# Patient Record
Sex: Male | Born: 1945 | Marital: Single | Smoking: Never smoker
Health system: Southern US, Community
[De-identification: ages and names within clinical notes are randomized; demographics above are authoritative.]

## PROBLEM LIST (undated history)

## (undated) DIAGNOSIS — N189 Chronic kidney disease, unspecified: Secondary | ICD-10-CM

## (undated) DIAGNOSIS — I10 Essential (primary) hypertension: Secondary | ICD-10-CM

## (undated) DIAGNOSIS — E119 Type 2 diabetes mellitus without complications: Secondary | ICD-10-CM

## (undated) DIAGNOSIS — R5383 Other fatigue: Secondary | ICD-10-CM

## (undated) DIAGNOSIS — R5381 Other malaise: Secondary | ICD-10-CM

## (undated) HISTORY — PX: OTHER SURGICAL HISTORY: SHX169

## (undated) HISTORY — DX: Other malaise: R53.81

## (undated) HISTORY — DX: Type 2 diabetes mellitus without complications: E11.9

## (undated) HISTORY — DX: Essential (primary) hypertension: I10

## (undated) HISTORY — DX: Chronic kidney disease, unspecified: N18.9

## (undated) HISTORY — DX: Other fatigue: R53.83

---

## 2013-07-19 DIAGNOSIS — E669 Obesity, unspecified: Secondary | ICD-10-CM | POA: Diagnosis not present

## 2013-07-19 DIAGNOSIS — I10 Essential (primary) hypertension: Secondary | ICD-10-CM | POA: Diagnosis not present

## 2013-07-19 DIAGNOSIS — R635 Abnormal weight gain: Secondary | ICD-10-CM | POA: Diagnosis not present

## 2013-07-19 DIAGNOSIS — R5381 Other malaise: Secondary | ICD-10-CM | POA: Diagnosis not present

## 2013-07-19 DIAGNOSIS — N529 Male erectile dysfunction, unspecified: Secondary | ICD-10-CM | POA: Diagnosis not present

## 2013-07-19 DIAGNOSIS — H6120 Impacted cerumen, unspecified ear: Secondary | ICD-10-CM | POA: Diagnosis not present

## 2013-07-19 DIAGNOSIS — Z0000 Encounter for general adult medical examination without abnormal findings: Secondary | ICD-10-CM | POA: Diagnosis not present

## 2013-07-19 DIAGNOSIS — E559 Vitamin D deficiency, unspecified: Secondary | ICD-10-CM | POA: Diagnosis not present

## 2013-07-19 DIAGNOSIS — R351 Nocturia: Secondary | ICD-10-CM | POA: Diagnosis not present

## 2013-07-19 DIAGNOSIS — R7309 Other abnormal glucose: Secondary | ICD-10-CM | POA: Diagnosis not present

## 2013-07-21 DIAGNOSIS — N529 Male erectile dysfunction, unspecified: Secondary | ICD-10-CM | POA: Diagnosis not present

## 2013-07-21 DIAGNOSIS — I10 Essential (primary) hypertension: Secondary | ICD-10-CM | POA: Diagnosis not present

## 2013-07-21 DIAGNOSIS — E669 Obesity, unspecified: Secondary | ICD-10-CM | POA: Diagnosis not present

## 2013-08-09 DIAGNOSIS — E1165 Type 2 diabetes mellitus with hyperglycemia: Secondary | ICD-10-CM | POA: Diagnosis not present

## 2013-08-09 DIAGNOSIS — Z713 Dietary counseling and surveillance: Secondary | ICD-10-CM | POA: Diagnosis not present

## 2013-08-09 DIAGNOSIS — N529 Male erectile dysfunction, unspecified: Secondary | ICD-10-CM | POA: Diagnosis not present

## 2013-08-09 DIAGNOSIS — I10 Essential (primary) hypertension: Secondary | ICD-10-CM | POA: Diagnosis not present

## 2013-08-09 DIAGNOSIS — IMO0002 Reserved for concepts with insufficient information to code with codable children: Secondary | ICD-10-CM | POA: Diagnosis not present

## 2013-09-19 DIAGNOSIS — E782 Mixed hyperlipidemia: Secondary | ICD-10-CM | POA: Diagnosis not present

## 2013-09-19 DIAGNOSIS — N529 Male erectile dysfunction, unspecified: Secondary | ICD-10-CM | POA: Diagnosis not present

## 2013-09-19 DIAGNOSIS — I10 Essential (primary) hypertension: Secondary | ICD-10-CM | POA: Diagnosis not present

## 2013-09-19 DIAGNOSIS — IMO0001 Reserved for inherently not codable concepts without codable children: Secondary | ICD-10-CM | POA: Diagnosis not present

## 2013-10-03 DIAGNOSIS — E119 Type 2 diabetes mellitus without complications: Secondary | ICD-10-CM | POA: Diagnosis not present

## 2013-10-03 DIAGNOSIS — I10 Essential (primary) hypertension: Secondary | ICD-10-CM | POA: Diagnosis not present

## 2013-10-03 DIAGNOSIS — Z1211 Encounter for screening for malignant neoplasm of colon: Secondary | ICD-10-CM | POA: Diagnosis not present

## 2013-10-10 DIAGNOSIS — IMO0001 Reserved for inherently not codable concepts without codable children: Secondary | ICD-10-CM | POA: Diagnosis not present

## 2013-10-10 DIAGNOSIS — I10 Essential (primary) hypertension: Secondary | ICD-10-CM | POA: Diagnosis not present

## 2013-10-19 DIAGNOSIS — IMO0002 Reserved for concepts with insufficient information to code with codable children: Secondary | ICD-10-CM | POA: Diagnosis not present

## 2013-10-19 DIAGNOSIS — Z79899 Other long term (current) drug therapy: Secondary | ICD-10-CM | POA: Diagnosis not present

## 2013-10-19 DIAGNOSIS — E1165 Type 2 diabetes mellitus with hyperglycemia: Secondary | ICD-10-CM | POA: Diagnosis not present

## 2013-10-19 DIAGNOSIS — E782 Mixed hyperlipidemia: Secondary | ICD-10-CM | POA: Diagnosis not present

## 2013-10-19 DIAGNOSIS — I10 Essential (primary) hypertension: Secondary | ICD-10-CM | POA: Diagnosis not present

## 2013-10-19 DIAGNOSIS — E669 Obesity, unspecified: Secondary | ICD-10-CM | POA: Diagnosis not present

## 2013-10-31 DIAGNOSIS — N529 Male erectile dysfunction, unspecified: Secondary | ICD-10-CM | POA: Diagnosis not present

## 2013-10-31 DIAGNOSIS — IMO0001 Reserved for inherently not codable concepts without codable children: Secondary | ICD-10-CM | POA: Diagnosis not present

## 2013-10-31 DIAGNOSIS — I10 Essential (primary) hypertension: Secondary | ICD-10-CM | POA: Diagnosis not present

## 2013-11-10 DIAGNOSIS — N529 Male erectile dysfunction, unspecified: Secondary | ICD-10-CM | POA: Diagnosis not present

## 2013-11-10 DIAGNOSIS — I10 Essential (primary) hypertension: Secondary | ICD-10-CM | POA: Diagnosis not present

## 2013-12-01 DIAGNOSIS — R5381 Other malaise: Secondary | ICD-10-CM | POA: Diagnosis not present

## 2013-12-01 DIAGNOSIS — N529 Male erectile dysfunction, unspecified: Secondary | ICD-10-CM | POA: Diagnosis not present

## 2013-12-01 DIAGNOSIS — E291 Testicular hypofunction: Secondary | ICD-10-CM | POA: Diagnosis not present

## 2013-12-01 DIAGNOSIS — IMO0001 Reserved for inherently not codable concepts without codable children: Secondary | ICD-10-CM | POA: Diagnosis not present

## 2013-12-01 DIAGNOSIS — R5383 Other fatigue: Secondary | ICD-10-CM | POA: Diagnosis not present

## 2014-01-25 DIAGNOSIS — N058 Unspecified nephritic syndrome with other morphologic changes: Secondary | ICD-10-CM | POA: Diagnosis not present

## 2014-01-25 DIAGNOSIS — N182 Chronic kidney disease, stage 2 (mild): Secondary | ICD-10-CM | POA: Diagnosis not present

## 2014-01-25 DIAGNOSIS — E1129 Type 2 diabetes mellitus with other diabetic kidney complication: Secondary | ICD-10-CM | POA: Diagnosis not present

## 2014-01-25 DIAGNOSIS — E1165 Type 2 diabetes mellitus with hyperglycemia: Secondary | ICD-10-CM | POA: Diagnosis not present

## 2014-01-25 DIAGNOSIS — I129 Hypertensive chronic kidney disease with stage 1 through stage 4 chronic kidney disease, or unspecified chronic kidney disease: Secondary | ICD-10-CM | POA: Diagnosis not present

## 2014-02-06 DIAGNOSIS — I10 Essential (primary) hypertension: Secondary | ICD-10-CM | POA: Diagnosis not present

## 2014-02-06 DIAGNOSIS — E291 Testicular hypofunction: Secondary | ICD-10-CM | POA: Diagnosis not present

## 2014-02-06 DIAGNOSIS — IMO0001 Reserved for inherently not codable concepts without codable children: Secondary | ICD-10-CM | POA: Diagnosis not present

## 2014-02-06 DIAGNOSIS — R5381 Other malaise: Secondary | ICD-10-CM | POA: Diagnosis not present

## 2014-02-06 DIAGNOSIS — R5383 Other fatigue: Secondary | ICD-10-CM | POA: Diagnosis not present

## 2014-03-09 DIAGNOSIS — E1165 Type 2 diabetes mellitus with hyperglycemia: Secondary | ICD-10-CM | POA: Diagnosis not present

## 2014-03-09 DIAGNOSIS — N058 Unspecified nephritic syndrome with other morphologic changes: Secondary | ICD-10-CM | POA: Diagnosis not present

## 2014-03-09 DIAGNOSIS — Z7982 Long term (current) use of aspirin: Secondary | ICD-10-CM | POA: Diagnosis not present

## 2014-03-09 DIAGNOSIS — E1129 Type 2 diabetes mellitus with other diabetic kidney complication: Secondary | ICD-10-CM | POA: Diagnosis not present

## 2014-03-09 DIAGNOSIS — N182 Chronic kidney disease, stage 2 (mild): Secondary | ICD-10-CM | POA: Diagnosis not present

## 2014-04-07 DIAGNOSIS — N182 Chronic kidney disease, stage 2 (mild): Secondary | ICD-10-CM | POA: Diagnosis not present

## 2014-04-07 DIAGNOSIS — E1129 Type 2 diabetes mellitus with other diabetic kidney complication: Secondary | ICD-10-CM | POA: Diagnosis not present

## 2014-04-07 DIAGNOSIS — N058 Unspecified nephritic syndrome with other morphologic changes: Secondary | ICD-10-CM | POA: Diagnosis not present

## 2014-04-07 DIAGNOSIS — I951 Orthostatic hypotension: Secondary | ICD-10-CM | POA: Diagnosis not present

## 2014-04-07 DIAGNOSIS — E1165 Type 2 diabetes mellitus with hyperglycemia: Secondary | ICD-10-CM | POA: Diagnosis not present

## 2014-05-04 DIAGNOSIS — I129 Hypertensive chronic kidney disease with stage 1 through stage 4 chronic kidney disease, or unspecified chronic kidney disease: Secondary | ICD-10-CM | POA: Diagnosis not present

## 2014-05-04 DIAGNOSIS — E1129 Type 2 diabetes mellitus with other diabetic kidney complication: Secondary | ICD-10-CM | POA: Diagnosis not present

## 2014-05-04 DIAGNOSIS — N182 Chronic kidney disease, stage 2 (mild): Secondary | ICD-10-CM | POA: Diagnosis not present

## 2014-05-04 DIAGNOSIS — N058 Unspecified nephritic syndrome with other morphologic changes: Secondary | ICD-10-CM | POA: Diagnosis not present

## 2014-05-04 DIAGNOSIS — E1165 Type 2 diabetes mellitus with hyperglycemia: Secondary | ICD-10-CM | POA: Diagnosis not present

## 2014-11-17 DIAGNOSIS — N182 Chronic kidney disease, stage 2 (mild): Secondary | ICD-10-CM | POA: Diagnosis not present

## 2014-11-17 DIAGNOSIS — E1122 Type 2 diabetes mellitus with diabetic chronic kidney disease: Secondary | ICD-10-CM | POA: Diagnosis not present

## 2014-11-17 DIAGNOSIS — N08 Glomerular disorders in diseases classified elsewhere: Secondary | ICD-10-CM | POA: Diagnosis not present

## 2014-11-17 DIAGNOSIS — E1165 Type 2 diabetes mellitus with hyperglycemia: Secondary | ICD-10-CM | POA: Diagnosis not present

## 2015-03-13 DIAGNOSIS — E559 Vitamin D deficiency, unspecified: Secondary | ICD-10-CM | POA: Diagnosis not present

## 2015-03-13 DIAGNOSIS — I129 Hypertensive chronic kidney disease with stage 1 through stage 4 chronic kidney disease, or unspecified chronic kidney disease: Secondary | ICD-10-CM | POA: Diagnosis not present

## 2015-03-13 DIAGNOSIS — R351 Nocturia: Secondary | ICD-10-CM | POA: Diagnosis not present

## 2015-03-13 DIAGNOSIS — Z0000 Encounter for general adult medical examination without abnormal findings: Secondary | ICD-10-CM | POA: Diagnosis not present

## 2015-03-13 DIAGNOSIS — Z1212 Encounter for screening for malignant neoplasm of rectum: Secondary | ICD-10-CM | POA: Diagnosis not present

## 2015-03-13 DIAGNOSIS — N08 Glomerular disorders in diseases classified elsewhere: Secondary | ICD-10-CM | POA: Diagnosis not present

## 2015-03-13 DIAGNOSIS — N182 Chronic kidney disease, stage 2 (mild): Secondary | ICD-10-CM | POA: Diagnosis not present

## 2015-03-13 DIAGNOSIS — Z125 Encounter for screening for malignant neoplasm of prostate: Secondary | ICD-10-CM | POA: Diagnosis not present

## 2015-03-13 DIAGNOSIS — E1122 Type 2 diabetes mellitus with diabetic chronic kidney disease: Secondary | ICD-10-CM | POA: Diagnosis not present

## 2015-03-13 DIAGNOSIS — Z1389 Encounter for screening for other disorder: Secondary | ICD-10-CM | POA: Diagnosis not present

## 2015-04-23 DIAGNOSIS — Z79899 Other long term (current) drug therapy: Secondary | ICD-10-CM | POA: Diagnosis not present

## 2015-06-21 DIAGNOSIS — I129 Hypertensive chronic kidney disease with stage 1 through stage 4 chronic kidney disease, or unspecified chronic kidney disease: Secondary | ICD-10-CM | POA: Diagnosis not present

## 2015-06-21 DIAGNOSIS — E1122 Type 2 diabetes mellitus with diabetic chronic kidney disease: Secondary | ICD-10-CM | POA: Diagnosis not present

## 2015-06-21 DIAGNOSIS — N08 Glomerular disorders in diseases classified elsewhere: Secondary | ICD-10-CM | POA: Diagnosis not present

## 2015-06-21 DIAGNOSIS — N182 Chronic kidney disease, stage 2 (mild): Secondary | ICD-10-CM | POA: Diagnosis not present

## 2015-10-01 DIAGNOSIS — N08 Glomerular disorders in diseases classified elsewhere: Secondary | ICD-10-CM | POA: Diagnosis not present

## 2015-10-01 DIAGNOSIS — E1122 Type 2 diabetes mellitus with diabetic chronic kidney disease: Secondary | ICD-10-CM | POA: Diagnosis not present

## 2015-10-01 DIAGNOSIS — N182 Chronic kidney disease, stage 2 (mild): Secondary | ICD-10-CM | POA: Diagnosis not present

## 2015-10-01 DIAGNOSIS — I129 Hypertensive chronic kidney disease with stage 1 through stage 4 chronic kidney disease, or unspecified chronic kidney disease: Secondary | ICD-10-CM | POA: Diagnosis not present

## 2016-01-03 DIAGNOSIS — N182 Chronic kidney disease, stage 2 (mild): Secondary | ICD-10-CM | POA: Diagnosis not present

## 2016-01-03 DIAGNOSIS — I129 Hypertensive chronic kidney disease with stage 1 through stage 4 chronic kidney disease, or unspecified chronic kidney disease: Secondary | ICD-10-CM | POA: Diagnosis not present

## 2016-01-03 DIAGNOSIS — N08 Glomerular disorders in diseases classified elsewhere: Secondary | ICD-10-CM | POA: Diagnosis not present

## 2016-01-03 DIAGNOSIS — E1122 Type 2 diabetes mellitus with diabetic chronic kidney disease: Secondary | ICD-10-CM | POA: Diagnosis not present

## 2016-04-28 DIAGNOSIS — E559 Vitamin D deficiency, unspecified: Secondary | ICD-10-CM | POA: Diagnosis not present

## 2016-04-28 DIAGNOSIS — E1122 Type 2 diabetes mellitus with diabetic chronic kidney disease: Secondary | ICD-10-CM | POA: Diagnosis not present

## 2016-04-28 DIAGNOSIS — I129 Hypertensive chronic kidney disease with stage 1 through stage 4 chronic kidney disease, or unspecified chronic kidney disease: Secondary | ICD-10-CM | POA: Diagnosis not present

## 2016-04-28 DIAGNOSIS — N08 Glomerular disorders in diseases classified elsewhere: Secondary | ICD-10-CM | POA: Diagnosis not present

## 2016-04-28 DIAGNOSIS — N182 Chronic kidney disease, stage 2 (mild): Secondary | ICD-10-CM | POA: Diagnosis not present

## 2016-06-05 DIAGNOSIS — H6122 Impacted cerumen, left ear: Secondary | ICD-10-CM | POA: Diagnosis not present

## 2016-07-24 DIAGNOSIS — F515 Nightmare disorder: Secondary | ICD-10-CM | POA: Diagnosis not present

## 2016-07-24 DIAGNOSIS — N08 Glomerular disorders in diseases classified elsewhere: Secondary | ICD-10-CM | POA: Diagnosis not present

## 2016-07-24 DIAGNOSIS — N182 Chronic kidney disease, stage 2 (mild): Secondary | ICD-10-CM | POA: Diagnosis not present

## 2016-07-24 DIAGNOSIS — E1122 Type 2 diabetes mellitus with diabetic chronic kidney disease: Secondary | ICD-10-CM | POA: Diagnosis not present

## 2016-11-19 DIAGNOSIS — E1122 Type 2 diabetes mellitus with diabetic chronic kidney disease: Secondary | ICD-10-CM | POA: Diagnosis not present

## 2016-11-19 DIAGNOSIS — N08 Glomerular disorders in diseases classified elsewhere: Secondary | ICD-10-CM | POA: Diagnosis not present

## 2016-11-19 DIAGNOSIS — F515 Nightmare disorder: Secondary | ICD-10-CM | POA: Diagnosis not present

## 2016-11-19 DIAGNOSIS — N182 Chronic kidney disease, stage 2 (mild): Secondary | ICD-10-CM | POA: Diagnosis not present

## 2017-03-09 DIAGNOSIS — N182 Chronic kidney disease, stage 2 (mild): Secondary | ICD-10-CM | POA: Diagnosis not present

## 2017-03-09 DIAGNOSIS — I129 Hypertensive chronic kidney disease with stage 1 through stage 4 chronic kidney disease, or unspecified chronic kidney disease: Secondary | ICD-10-CM | POA: Diagnosis not present

## 2017-03-09 DIAGNOSIS — N08 Glomerular disorders in diseases classified elsewhere: Secondary | ICD-10-CM | POA: Diagnosis not present

## 2017-03-09 DIAGNOSIS — E1122 Type 2 diabetes mellitus with diabetic chronic kidney disease: Secondary | ICD-10-CM | POA: Diagnosis not present

## 2017-05-25 DIAGNOSIS — N08 Glomerular disorders in diseases classified elsewhere: Secondary | ICD-10-CM | POA: Diagnosis not present

## 2017-05-25 DIAGNOSIS — E1122 Type 2 diabetes mellitus with diabetic chronic kidney disease: Secondary | ICD-10-CM | POA: Diagnosis not present

## 2017-05-25 DIAGNOSIS — R351 Nocturia: Secondary | ICD-10-CM | POA: Diagnosis not present

## 2017-05-25 DIAGNOSIS — Z0000 Encounter for general adult medical examination without abnormal findings: Secondary | ICD-10-CM | POA: Diagnosis not present

## 2017-05-25 DIAGNOSIS — Z1212 Encounter for screening for malignant neoplasm of rectum: Secondary | ICD-10-CM | POA: Diagnosis not present

## 2017-05-25 DIAGNOSIS — R195 Other fecal abnormalities: Secondary | ICD-10-CM | POA: Diagnosis not present

## 2017-05-25 DIAGNOSIS — I129 Hypertensive chronic kidney disease with stage 1 through stage 4 chronic kidney disease, or unspecified chronic kidney disease: Secondary | ICD-10-CM | POA: Diagnosis not present

## 2017-05-25 DIAGNOSIS — N182 Chronic kidney disease, stage 2 (mild): Secondary | ICD-10-CM | POA: Diagnosis not present

## 2017-05-25 DIAGNOSIS — E559 Vitamin D deficiency, unspecified: Secondary | ICD-10-CM | POA: Diagnosis not present

## 2017-05-26 DIAGNOSIS — E1122 Type 2 diabetes mellitus with diabetic chronic kidney disease: Secondary | ICD-10-CM | POA: Diagnosis not present

## 2017-05-26 DIAGNOSIS — N182 Chronic kidney disease, stage 2 (mild): Secondary | ICD-10-CM | POA: Diagnosis not present

## 2017-05-26 DIAGNOSIS — N08 Glomerular disorders in diseases classified elsewhere: Secondary | ICD-10-CM | POA: Diagnosis not present

## 2017-05-26 DIAGNOSIS — Z1212 Encounter for screening for malignant neoplasm of rectum: Secondary | ICD-10-CM | POA: Diagnosis not present

## 2017-05-26 DIAGNOSIS — I129 Hypertensive chronic kidney disease with stage 1 through stage 4 chronic kidney disease, or unspecified chronic kidney disease: Secondary | ICD-10-CM | POA: Diagnosis not present

## 2017-05-26 DIAGNOSIS — Z0000 Encounter for general adult medical examination without abnormal findings: Secondary | ICD-10-CM | POA: Diagnosis not present

## 2017-05-26 DIAGNOSIS — R195 Other fecal abnormalities: Secondary | ICD-10-CM | POA: Diagnosis not present

## 2017-10-01 DIAGNOSIS — N182 Chronic kidney disease, stage 2 (mild): Secondary | ICD-10-CM | POA: Diagnosis not present

## 2017-10-01 DIAGNOSIS — I129 Hypertensive chronic kidney disease with stage 1 through stage 4 chronic kidney disease, or unspecified chronic kidney disease: Secondary | ICD-10-CM | POA: Diagnosis not present

## 2017-10-01 DIAGNOSIS — N08 Glomerular disorders in diseases classified elsewhere: Secondary | ICD-10-CM | POA: Diagnosis not present

## 2017-10-01 DIAGNOSIS — E1122 Type 2 diabetes mellitus with diabetic chronic kidney disease: Secondary | ICD-10-CM | POA: Diagnosis not present

## 2017-10-01 LAB — LIPID PANEL
Cholesterol: 163 (ref 0–200)
HDL: 64 (ref 35–70)
LDL Cholesterol: 72
LDl/HDL Ratio: 1.1
Triglycerides: 133 (ref 40–160)

## 2017-10-01 LAB — HEPATIC FUNCTION PANEL
ALT: 19 (ref 10–40)
AST: 19 (ref 14–40)
Alkaline Phosphatase: 59 (ref 25–125)
Bilirubin, Total: 0.4

## 2017-10-01 LAB — BASIC METABOLIC PANEL
BUN: 13 (ref 4–21)
Creatinine: 1.2 (ref 0.6–1.3)
Glucose: 189
Potassium: 4.2 (ref 3.4–5.3)
Sodium: 141 (ref 137–147)

## 2017-10-01 LAB — TSH: TSH: 1.6 (ref 0.41–5.90)

## 2017-10-01 LAB — HEMOGLOBIN A1C: Hgb A1c MFr Bld: 7.4 — AB (ref 4.0–6.0)

## 2017-10-13 ENCOUNTER — Encounter: Payer: Self-pay | Admitting: Neurology

## 2017-10-14 ENCOUNTER — Ambulatory Visit (INDEPENDENT_AMBULATORY_CARE_PROVIDER_SITE_OTHER): Payer: Medicare Other | Admitting: Neurology

## 2017-10-14 ENCOUNTER — Encounter: Payer: Self-pay | Admitting: Neurology

## 2017-10-14 VITALS — BP 138/75 | HR 62 | Ht 69.0 in | Wt 216.0 lb

## 2017-10-14 DIAGNOSIS — G4719 Other hypersomnia: Secondary | ICD-10-CM | POA: Diagnosis not present

## 2017-10-14 DIAGNOSIS — N182 Chronic kidney disease, stage 2 (mild): Secondary | ICD-10-CM

## 2017-10-14 DIAGNOSIS — R0683 Snoring: Secondary | ICD-10-CM | POA: Diagnosis not present

## 2017-10-14 DIAGNOSIS — G4752 REM sleep behavior disorder: Secondary | ICD-10-CM

## 2017-10-14 DIAGNOSIS — E1140 Type 2 diabetes mellitus with diabetic neuropathy, unspecified: Secondary | ICD-10-CM

## 2017-10-14 DIAGNOSIS — G4726 Circadian rhythm sleep disorder, shift work type: Secondary | ICD-10-CM | POA: Insufficient documentation

## 2017-10-14 DIAGNOSIS — R351 Nocturia: Secondary | ICD-10-CM

## 2017-10-14 MED ORDER — CLONAZEPAM 0.25 MG PO TBDP: ORAL_TABLET | ORAL | 0 refills | Status: DC

## 2017-10-14 NOTE — Patient Instructions (Addendum)
Plan:  Treatment plan and additional workup : SPLIT night PSG ( sleep study ) was ordered.  with AHI 20, 4%.  and REM behavior montage / parasomnia. NEED AUDIO and VIDEO . Patient reportedly failed melatonin, 5 mg or less taken at bedtime or 1 hours before. I am not sure that THC/ CBG could not contribute to vivid dreams - and eliminate late afternoon caffeine Eliminate decongestants - the D in robotussin, delsym, mucinex , etc.  Start low dose klonopin- eliminate TV form bedroom, create a cool, quiet and dark bedroom, soothing back ground noise.    Clonazepam tablets What is this medicine? CLONAZEPAM (kloe NA ze pam) is a benzodiazepine. It is used to treat certain types of seizures. It is also used to treat panic disorder. This medicine may be used for other purposes; ask your health care provider or pharmacist if you have questions. COMMON BRAND NAME(S): Ceberclon, Klonopin What should I tell my health care provider before I take this medicine? They need to know if you have any of these conditions:  -nightmares or dream behavior.  -an alcohol or drug abuse problem -bipolar disorder, depression, psychosis or other mental health condition -glaucoma -kidney or liver disease -lung or breathing disease -myasthenia gravis -Parkinson's disease -porphyria -seizures or a history of seizures -suicidal thoughts -an unusual or allergic reaction to clonazepam, other benzodiazepines, foods, dyes, or preservatives -pregnant or trying to get pregnant -breast-feeding How should I use this medicine? Take this medicine by mouth with a glass of water. Follow the directions on the prescription label. If it upsets your stomach, take it with food or milk. Take your medicine at regular intervals. Do not take it more often than directed. Do not stop taking or change the dose except on the advice of your doctor or health care professional. A special MedGuide will be given to you by the pharmacist with each  prescription and refill. Be sure to read this information carefully each time. Talk to your pediatrician regarding the use of this medicine in children. Special care may be needed. Overdosage: If you think you have taken too much of this medicine contact a poison control center or emergency room at once. NOTE: This medicine is only for you. Do not share this medicine with others. What if I miss a dose? If you miss a dose, take it as soon as you can. If it is almost time for your next dose, take only that dose. Do not take double or extra doses. What may interact with this medicine? Do not take this medication with any of the following medicines: -narcotic medicines for cough -sodium oxybate This medicine may also interact with the following medications: -alcohol -antihistamines for allergy, cough and cold -antiviral medicines for HIV or AIDS -certain medicines for anxiety or sleep -certain medicines for depression, like amitriptyline, fluoxetine, sertraline -certain medicines for fungal infections like ketoconazole and itraconazole -certain medicines for seizures like carbamazepine, phenobarbital, phenytoin, primidone -general anesthetics like halothane, isoflurane, methoxyflurane, propofol -local anesthetics like lidocaine, pramoxine, tetracaine -medicines that relax muscles for surgery -narcotic medicines for pain -phenothiazines like chlorpromazine, mesoridazine, prochlorperazine, thioridazine This list may not describe all possible interactions. Give your health care provider a list of all the medicines, herbs, non-prescription drugs, or dietary supplements you use. Also tell them if you smoke, drink alcohol, or use illegal drugs. Some items may interact with your medicine. What should I watch for while using this medicine? Tell your doctor or health care professional if your symptoms do  not start to get better or if they get worse. Do not stop taking except on your doctor's advice. You  may develop a severe reaction. Your doctor will tell you how much medicine to take. You may get drowsy or dizzy. Do not drive, use machinery, or do anything that needs mental alertness until you know how this medicine affects you. To reduce the risk of dizzy and fainting spells, do not stand or sit up quickly, especially if you are an older patient. Alcohol may increase dizziness and drowsiness. Avoid alcoholic drinks. If you are taking another medicine that also causes drowsiness, you may have more side effects. Give your health care provider a list of all medicines you use. Your doctor will tell you how much medicine to take. Do not take more medicine than directed. Call emergency for help if you have problems breathing or unusual sleepiness. The use of this medicine may increase the chance of suicidal thoughts or actions. Pay special attention to how you are responding while on this medicine. Any worsening of mood, or thoughts of suicide or dying should be reported to your health care professional right away. What side effects may I notice from receiving this medicine? Side effects that you should report to your doctor or health care professional as soon as possible: -allergic reactions like skin rash, itching or hives, swelling of the face, lips, or tongue -breathing problems -confusion -loss of balance or coordination -signs and symptoms of low blood pressure like dizziness; feeling faint or lightheaded, falls; unusually weak or tired -suicidal thoughts or mood changes Side effects that usually do not require medical attention (report to your doctor or health care professional if they continue or are bothersome): -dizziness -headache -tiredness -upset stomach This list may not describe all possible side effects. Call your doctor for medical advice about side effects. You may report side effects to FDA at 1-800-FDA-1088. Where should I keep my medicine? Keep out of the reach of children. This  medicine can be abused. Keep your medicine in a safe place to protect it from theft. Do not share this medicine with anyone. Selling or giving away this medicine is dangerous and against the law. This medicine may cause accidental overdose and death if taken by other adults, children, or pets. Mix any unused medicine with a substance like cat litter or coffee grounds. Then throw the medicine away in a sealed container like a sealed bag or a coffee can with a lid. Do not use the medicine after the expiration date. Store at room temperature between 15 and 30 degrees C (59 and 86 degrees F). Protect from light. Keep container tightly closed. NOTE: This sheet is a summary. It may not cover all possible information. If you have questions about this medicine, talk to your doctor, pharmacist, or health care provider.  2018 Elsevier/Gold Standard (2016-01-04 18:46:32)

## 2017-10-14 NOTE — Progress Notes (Signed)
SLEEP MEDICINE CLINIC   Provider:  Melvyn [name], M D  Primary Care Physician:  Warren Peng, MD  Referring Provider: Dorothyann Peng, MD   Chief Complaint  Warren [name] presents with  . New Warren [name] (Initial Visit)    pt alone, rm 11 pt states that he has had difficulty with sleep walking. pt has had a lot of deaths in his family. since his dad passed away 2 years ago he started noticing interuption in sleep. pt having nightmares. wakes up with headaches, wakes up in another room. he has woke himself up fighting. he says he doesnt have problems with sleep. he states that he has good nights and bad nights.     HPI:  10-14-2017  Warren Warren [name] is a 72 y.o. male , a widower, who seen here in a referral from Dr. Allyne [name] for a sleep consultation.  Warren Warren [name] is a 72 year old left-handed African-American gentleman with a list of diagnoses of hypotension test of anemia, diabetic renal disease CKD stage II, malignant hypertension, and one time he was obese, and he is diabetic.  EKGs have been normal he is currently using amlodipine, Janumet, Viagra as needed, atorvastatin 20 mg, valsartan 320 mg and hydrochlorothiazide 25 mg, there are no listed medication allergies.  Most recent EKG showed normal conduction and no sign of left ventricular hypertrophy.  Body mass index is between 30 and 32.  This is not morbid obesity.  I had also Warren pleasure of seeing Warren recent labs and Warren Warren [name] had a slightly low vitamin D level at 20.9 ng, and normal prostate-specific antigen level, hemoglobin A1c was 8.1, CBC and differential were normal, metabolic panel was normal.  His creatinine BUN ratio was 12.  His lipid panel showed a normal total testosterone of 177.  Triglycerides 119, creatinine was 1.28 and fasting glucose 150 mg/dL elevated.   His sleep concerns are about frequent night mares, beginning at Warren time his father passed away.  He woud wake up in Warren living room or kitchen and was unsure when and why he  got there. He was sleep walking,  could not remember what he looked for. His girlfriend would call his name and he woke up- but was amnestic.  His children recorded him talking in his sleep- a full conversation. After Warren death of his father it escalated further. His dreams were violent, thrashing, he felt under threat, fought off intruders and attackers. He would  hit and kick. He no longer leaves Warren bed- and a spell lasts 2 minutes , he is still amnestic. His girlfriend taped this behavior - but he was too embarrassed to show it to me.    Sleep habits are as follows: he watches news on TV , and goes to bedroom by 11 PM, drinks a caffeine free herbal tea and takes magnesium po. He is usually asleep within 10 minutes in bed. He sometimes watches TV in bed- baseball, basket ball. He will act out dreams towards Warren morning.  He goes to urinate 3-4 times at night, is on HCTZ. First nocturia at 1 AM.  Rise time is 7 AM- Every morning, his girlfriend works. Estimates 4.5 hours of sleep only, but feels refreshed. He was a third shift worker, used to take power naps, still takes  2 -3 a day, 30 minutes.    Sleep and Medical History and Family Sleep History:  Father died with dementia at age 74, had prostate cancer , Warren [name] was main caretaker.  Dec 29th 2018 his  younger brother passed away at age 8.  Still is mother's caretaker.    Social history: night shift worker for 30 years. widowed since 89, his wife dies at age 82 of breast cancer, they had a 55 year old daughter at Warren time.  Warren Warren [name] retired in 2008 to be able to take care of his father.  [place]  He has adult children.  4 children all healthy.  Has never been a smoker of tobacco, social drinker- 2-3 beers on weekends, caffeine : coffee some mornings, hot tea at night, soda:  cut down( he is diabetic) used to drink mountain dew for 30 years! some red bull.    Review of Systems: Out of a complete 14 system  review, Warren Warren [name] complains of only Warren following symptoms, and all other reviewed systems are negative. Sleep walking, sleep talking, yelling, REM sleep BD described above.   Epworth score 6 , Fatigue severity score 20  , depression score 2/ 15   Social History   Socioeconomic History  . Marital status: Single    Spouse name: Not on file  . Number of children: Not on file  . Years of education: Not on file  . Highest education level: Not on file  Social Needs  . Financial resource strain: Not on file  . Food insecurity - worry: Not on file  . Food insecurity - inability: Not on file  . Transportation needs - medical: Not on file  . Transportation needs - non-medical: Not on file  Occupational History  . Not on file  Tobacco Use  . Smoking status: Never Smoker  . Smokeless tobacco: Never Used  Substance and Sexual Activity  . Alcohol use: Yes    Alcohol/week: 1.8 oz    Types: 3 Cans of beer per week  . Drug use: Yes    Types: Marijuana  . Sexual activity: Not on file  Other Topics Concern  . Not on file  Social History Narrative  . Not on file    Family History  Problem Relation Age of Onset  . Hypertension Mother   . Alzheimer's disease Father     Past Medical History:  Diagnosis Date  . Chronic kidney disease    stage 2  . Diabetes mellitus without complication (HCC)   . Hypertension   . Malaise and fatigue       Current Outpatient Medications  Medication Sig Dispense Refill  . amLODipine (NORVASC) 10 MG tablet Take 10 mg by mouth daily.    Marland Kitchen atorvastatin (LIPITOR) 20 MG tablet Take 20 mg by mouth daily.    . Magnesium 250 MG TABS Take 1 tablet by mouth daily.    . sildenafil (VIAGRA) 100 MG tablet Take 100 mg by mouth as needed for erectile dysfunction.    . sitaGLIPtin-metformin (JANUMET) 50-500 MG tablet Take 1 tablet by mouth daily.    . valsartan-hydrochlorothiazide (DIOVAN-HCT) 320-25 MG tablet Take 1 tablet by mouth daily.     No current  facility-administered medications for this visit.     Allergies as of 10/14/2017  . (Not on File)    Vitals: BP 138/75   Pulse 62   Ht 5\' 9"  (1.753 m)   Wt 216 lb (98 kg)   BMI 31.90 kg/m  Last Weight:  Wt Readings from Last 1 Encounters:  10/14/17 216 lb (98 kg)   ZOX:WRUE mass index is 31.9 kg/m.     Last Height:  Ht Readings from Last 1 Encounters:  10/14/17 5\' 9"  (1.753 m)    Physical exam:  General: Warren Warren [name] is awake, alert and appears not in acute distress. Warren Warren [name] is well groomed. Head: Normocephalic, atraumatic. Neck is supple. Mallampati 3,  neck circumference: 15.5 . Nasal airflow congestion in season,  Retrognathia is seen.  Cardiovascular:  Regular rate and rhythm , without  murmurs or carotid bruit, and without distended neck veins. Respiratory: Lungs are clear to auscultation. Skin:  Without evidence of edema, or rash Trunk: BMI is 32. Warren Warren [name]'s posture is erect  Neurologic exam : Warren Warren [name] is awake and alert, oriented to place and time.   Memory subjective described as intact.   Attention span & concentration ability appears normal.  Speech is fluent,  without dysarthria, dysphonia or aphasia.  Mood and affect are appropriate.  Cranial nerves: Pupils are equal and briskly reactive to light. Funduscopic exam without evidence of pallor or edema. Extraocular movements  in vertical and horizontal planes intact and without nystagmus. Visual fields by finger perimetry are intact. Hearing to finger rub intact.  Facial sensation intact to fine touch. Facial motor strength is symmetric and tongue and uvula move midline. Shoulder shrug was symmetrical.   Motor exam:   Normal tone, muscle bulk and symmetric strength in all extremities. No cog wheeling ! No tremor, no parkinsonian features.   Sensory:  Fine touch, pinprick and vibration were tested in all extremities. Proprioception tested in Warren upper extremities was normal. Coordination:   Finger-to-nose maneuver  normal without evidence of ataxia, dysmetria or tremor. Gait and station: Warren [name] walks without assistive device . Turns with 3 Steps. Romberg testing is  negative. Deep tendon reflexes: in Warren  upper and lower extremities are symmetric and intact.   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 He is not aware of childhood sleep walking.    Warren Warren [name] was advised of Warren nature of Warren diagnosed disorder , Warren treatment options and Warren  risks for general health and wellness arising from not treating Warren condition.   I spent more than 50 minutes of face to face time with Warren Warren [name].  Greater than 50% of time was spent in counseling and coordination of care. We have discussed Warren diagnosis and differential and I answered Warren Warren [name]'s questions.    Plan:  Treatment plan and additional workup : SPLIT night PSG with AHI 20, 4%. REM behavior montage / parasomnia. NEED AUDIO and VIDEO . Warren [name] reportedly failed melatonin, 5 mg or less taken at bedtime or 1 hours before.  I am not sure that THC/ CBG could not contribute to vivid dreams - and eliminate late afternoon caffeine Eliminate decongestants - Warren D in robotussin, delsym, mucinex , etc.  Start low dose klonopin-       Warren NovasARMEN Rylan Kaufmann, MD 10/14/2017, 9:15 AM  Certified in Neurology by ABPN Certified in Sleep Medicine by Charlean SanfilippoABSM  [facility] [address]  [address], [place],  [place] [address]

## 2017-11-12 ENCOUNTER — Ambulatory Visit (INDEPENDENT_AMBULATORY_CARE_PROVIDER_SITE_OTHER): Payer: Medicare Other | Admitting: Neurology

## 2017-11-12 DIAGNOSIS — G4752 REM sleep behavior disorder: Secondary | ICD-10-CM | POA: Diagnosis not present

## 2017-11-12 DIAGNOSIS — R351 Nocturia: Secondary | ICD-10-CM

## 2017-11-12 DIAGNOSIS — R0683 Snoring: Secondary | ICD-10-CM

## 2017-11-12 DIAGNOSIS — N182 Chronic kidney disease, stage 2 (mild): Secondary | ICD-10-CM

## 2017-11-12 DIAGNOSIS — E1140 Type 2 diabetes mellitus with diabetic neuropathy, unspecified: Secondary | ICD-10-CM

## 2017-11-16 NOTE — Procedures (Signed)
PATIENT'S NAME:  Warren [name], Camp DOB:      [birth year]      MR#:    [id number]     DATE OF RECORDING: 11/12/2017 REFERRING M.D.:  Dorothyann Pengobyn Sanders, M.D. Study Performed:   Baseline Polysomnogram HISTORY: [name] is a 72 year old left-handed African-American gentleman with a list of diagnoses: hypotension , anemia, diabetic renal disease/ CKD stage II, malignant hypertension, at one time he was obese, and he is diabetic.  EKGs have been normal.   His sleep concerns are about frequent nightmares, beginning at the time his father passed away.  He would wake up in the living room or kitchen and was unsure when and how he got there. He was sleep walking, could not remember what he looked for. His girlfriend would call his name and he woke up- but was amnestic.  His children recorded him talking in his sleep- a full conversation. After the death of his father it escalated further. His dreams were violent, thrashing, he felt under threat, fought off "intruders and attackers" - He would hit and kick. He no longer leaves the bed- and a spell lasts 2 minutes, he is amnestic for the content of dreams and events. His girlfriend taped this behavior - but he was too embarrassed to show it to me.   Uncontrolled REM sleep behavior disorder, Diabetes, Hypertension, Malaise and Fatigue. The patient endorsed the Epworth Sleepiness Scale at 6/24 points.  The patient's weight 216 pounds with a height of 69 (inches), resulting in a BMI of 32 kg/m2.The patient's neck circumference measured 15 inches.  CURRENT MEDICATIONS: Norvasc, Lipitor, Magnesium, Viagra, Janumet, Diovan.   PROCEDURE:  This is a multichannel digital polysomnogram utilizing the SomnoStar 11.2 system.  Electrodes and sensors were applied and monitored per AASM Specifications.   EEG, EOG, Chin and Limb EMG, were sampled at 200 Hz.  ECG, Snore and Nasal Pressure, Thermal Airflow, Respiratory Effort, CPAP Flow and Pressure, Oximetry was sampled at 50 Hz. Digital  video and audio were recorded.      BASELINE STUDY: Lights Out was at 22:07 and Lights On at 04:49.  Total recording time (TRT) was 402.5 minutes, with a total sleep time (TST) of 184.5 minutes.  The patient's sleep latency was 47 minutes. REM latency was 22 minutes.  The sleep efficiency was 45.8 %.     SLEEP ARCHITECTURE: WASO (Wake after sleep onset) was 178 minutes.  There were 21.5 minutes in Stage N1, 112.5 minutes Stage N2, 0 minutes Stage N3 and 50.5 minutes in Stage REM.  The percentage of Stage N1 was 11.7%, Stage N2 was 61.%, Stage N3 was 0% and Stage R (REM sleep) was 27.4%.   RESPIRATORY ANALYSIS:  There were a total of 4 respiratory events:  1 obstructive apnea, 2 central apneas and 1 hypopnea. The patient also had 0 respiratory event related arousals (RERAs).    The total APNEA/HYPOPNEA INDEX (AHI) was 1.3/hour and the total RESPIRATORY DISTURBANCE INDEX was 1.3 /hour.  4 events occurred in REM sleep and 0 events in NREM. The REM AHI was 4.8 /hour, versus a non-REM AHI of 0. The patient spent 97 minutes of total sleep time in the supine position and 88 minutes in non-supine. The supine AHI was 1.2 versus a non-supine AHI of 1.4.  OXYGEN SATURATION & C02:  The Wake baseline 02 saturation was 96%, with the lowest being 86%. Time spent below 89% saturation equaled 1 minute.    PERIODIC LIMB MOVEMENTS:  The patient had a total  of 52 Periodic Limb Movements.  The Periodic Limb Movement (PLM) index was 16.9 and the PLM Arousal index was 2.9/hour. The arousals were noted as: 30 were spontaneous, 9 were associated with PLMs, and only 1 was associated with respiratory events.  Audio and video analysis did show sleep talking and phonations / vocalizations. The first REM sleep talk was noted at 23.15, and by 1.15 AM there were yelling and shouting noted. All these in REM sleep without PLMs .   The patient took 2 bathroom breaks. Loud Snoring was noted. EKG was in keeping with normal sinus rhythm  (NSR). EEG was normal- neither slowed nor asymmetric.  IMPRESSION:  1. 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  2. No PLMs, none in REM sleep. 3. 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    RECOMMENDATIONS: First Line Treatment with melatonin and, if not sufficient, Klonopin is recommended.   1. A follow up appointment will be scheduled in the Sleep Clinic at [facility]. The referring provider will be notified of the results.      I certify that I have reviewed the entire raw data recording prior to the issuance of this report in accordance with the Standards of Accreditation of the American Academy of Sleep Medicine (AASM)    Melvyn Novas, MD      11-16-2017  Diplomat, American Board of Psychiatry and Neurology  Diplomat, American Board of Sleep Medicine Medical Director, [place] [facility] [employer]

## 2017-11-17 ENCOUNTER — Telehealth: Payer: Self-pay | Admitting: Neurology

## 2017-11-17 NOTE — Telephone Encounter (Signed)
Pt returned call and I was able to review and go over the sleep study with the patient. I have discussed with the patient about treatment and how first line of treatment is melatonin. The patient has already tried multiple over the counter medications to help and they have not worked. The patient was already started on .25 mg of klonopin by Dr Vickey Hugerohmeier in Feb 2019. The patient states that his wife still notices that it happens and the patient doesn't feel like it is being as helpful. I have made him aware that Dr Vickey Hugerohmeier is away on vacation but that if he is ok I can review his concern with her upon her return and the patient verbalized understanding and was fine with that.

## 2017-11-17 NOTE — Telephone Encounter (Signed)
-----   Message from Melvyn Novasarmen Dohmeier, MD sent at 11/16/2017  5:04 PM EDT ----- Cc DR Allyne GeeSanders, Dx is REM behavior disorder, treatment is noted above- first Melatonin, if not enough, use Klonopin. CD

## 2017-11-17 NOTE — Telephone Encounter (Signed)
Called patient to discuss sleep study results. No answer at this time. LVM for the patient to call back.   

## 2017-12-08 MED ORDER — CLONAZEPAM 0.5 MG PO TABS: 0.5000 mg | ORAL_TABLET | Freq: Every day | ORAL | 5 refills | Status: DC

## 2017-12-08 NOTE — Telephone Encounter (Signed)
Called the patient to inform him that I went over his concerns with Dr Vickey Huger. She will increase the dosage on the Klonopin from .  to 0.5 mg Klonopin.  No answer. LVM for the pt to call back. When pt calls back please make the pt aware of this increase in the medication and ask him what pharmacy should I send it to. Thanks

## 2017-12-08 NOTE — Telephone Encounter (Signed)
Pt has called back to let RN Baird Lyons know he'd like the increase medication to be sent to  [facility] - [place], [place] - [address] AT [facility] [address] & [address] [phone] (Phone) [phone] (Fax)

## 2017-12-08 NOTE — Addendum Note (Signed)
Addended by: Judi Cong on: 12/08/2017 11:59 AM   Modules accepted: Orders

## 2017-12-08 NOTE — Telephone Encounter (Signed)
Medication is faxed.

## 2018-01-12 DIAGNOSIS — X500XXA Overexertion from strenuous movement or load, initial encounter: Secondary | ICD-10-CM | POA: Diagnosis not present

## 2018-01-12 DIAGNOSIS — S39012A Strain of muscle, fascia and tendon of lower back, initial encounter: Secondary | ICD-10-CM | POA: Diagnosis not present

## 2018-02-03 DIAGNOSIS — N182 Chronic kidney disease, stage 2 (mild): Secondary | ICD-10-CM | POA: Diagnosis not present

## 2018-02-03 DIAGNOSIS — E559 Vitamin D deficiency, unspecified: Secondary | ICD-10-CM | POA: Diagnosis not present

## 2018-02-03 DIAGNOSIS — I129 Hypertensive chronic kidney disease with stage 1 through stage 4 chronic kidney disease, or unspecified chronic kidney disease: Secondary | ICD-10-CM | POA: Diagnosis not present

## 2018-02-03 DIAGNOSIS — Z1389 Encounter for screening for other disorder: Secondary | ICD-10-CM | POA: Diagnosis not present

## 2018-02-03 DIAGNOSIS — E1122 Type 2 diabetes mellitus with diabetic chronic kidney disease: Secondary | ICD-10-CM | POA: Diagnosis not present

## 2018-02-03 DIAGNOSIS — N08 Glomerular disorders in diseases classified elsewhere: Secondary | ICD-10-CM | POA: Diagnosis not present

## 2018-02-03 LAB — BASIC METABOLIC PANEL
BUN: 15 (ref 4–21)
Creatinine: 1.7 — AB (ref 0.6–1.3)
Glucose: 215
Potassium: 5.6 — AB (ref 3.4–5.3)
Sodium: 144 (ref 137–147)

## 2018-02-03 LAB — HEMOGLOBIN A1C: Hgb A1c MFr Bld: 8.6 — AB (ref 4.0–6.0)

## 2018-02-03 LAB — HEPATIC FUNCTION PANEL
ALT: 28 (ref 10–40)
AST: 18 (ref 14–40)
Alkaline Phosphatase: 71 (ref 25–125)
Bilirubin, Total: 0.6

## 2018-02-03 LAB — VITAMIN D 25 HYDROXY (VIT D DEFICIENCY, FRACTURES): Vit D, 25-Hydroxy: 36.1

## 2018-03-10 ENCOUNTER — Other Ambulatory Visit: Payer: Self-pay

## 2018-04-08 DIAGNOSIS — E119 Type 2 diabetes mellitus without complications: Secondary | ICD-10-CM | POA: Diagnosis not present

## 2018-04-08 DIAGNOSIS — H2513 Age-related nuclear cataract, bilateral: Secondary | ICD-10-CM | POA: Diagnosis not present

## 2018-05-26 ENCOUNTER — Ambulatory Visit (INDEPENDENT_AMBULATORY_CARE_PROVIDER_SITE_OTHER): Payer: Medicare Other

## 2018-05-26 VITALS — BP 122/70 | HR 58 | Temp 98.5°F | Ht 68.0 in | Wt 204.0 lb

## 2018-05-26 DIAGNOSIS — Z0000 Encounter for general adult medical examination without abnormal findings: Secondary | ICD-10-CM | POA: Diagnosis not present

## 2018-05-26 NOTE — Patient Instructions (Addendum)
[name] , Thank you for taking time to come for your Medicare Wellness Visit. I appreciate your ongoing commitment to your health goals. Please review the following plan we discussed and let me know if I can assist you in the future.   Screening recommendations/referrals: Colonoscopy: not interested, due for FOBT in 2020 Recommended yearly ophthalmology/optometry visit for glaucoma screening and checkup Recommended yearly dental visit for hygiene and checkup  Vaccinations: Influenza vaccine: declined Pneumococcal vaccine: declined Tdap vaccine: 07/19/2013 Shingles vaccine: declined    Advanced directives: Advance directive discussed with you today. I have provided a copy for you to complete at home and have notarized. Once this is complete please bring a copy in to our office so we can scan it into your chart.   Conditions/risks identified: Obesity: Patient is working on Field seismologist intake. Patient has seen eye doctor for the first time this year. He has an appointment for next year.  Next appointment: 07/06/2018 at 10:00a  Preventive Care 65 Years and Older, Male Preventive care refers to lifestyle choices and visits with your health care provider that can promote health and wellness. What does preventive care include?  A yearly physical exam. This is also called an annual well check.  Dental exams once or twice a year.  Routine eye exams. Ask your health care provider how often you should have your eyes checked.  Personal lifestyle choices, including:  Daily care of your teeth and gums.  Regular physical activity.  Eating a healthy diet.  Avoiding tobacco and drug use.  Limiting alcohol use.  Practicing safe sex.  Taking low doses of aspirin every day.  Taking vitamin and mineral supplements as recommended by your health care provider. What happens during an annual well check? The services and screenings done by your health care provider during your annual  well check will depend on your age, overall health, lifestyle risk factors, and family history of disease. Counseling  Your health care provider may ask you questions about your:  Alcohol use.  Tobacco use.  Drug use.  Emotional well-being.  Home and relationship well-being.  Sexual activity.  Eating habits.  History of falls.  Memory and ability to understand (cognition).  Work and work Astronomer. Screening  You may have the following tests or measurements:  Height, weight, and BMI.  Blood pressure.  Lipid and cholesterol levels. These may be checked every 5 years, or more frequently if you are over 54 years old.  Skin check.  Lung cancer screening. You may have this screening every year starting at age 32 if you have a 30-pack-year history of smoking and currently smoke or have quit within the past 15 years.  Fecal occult blood test (FOBT) of the stool. You may have this test every year starting at age 73.  Flexible sigmoidoscopy or colonoscopy. You may have a sigmoidoscopy every 5 years or a colonoscopy every 10 years starting at age 37.  Prostate cancer screening. Recommendations will vary depending on your family history and other risks.  Hepatitis C blood test.  Hepatitis B blood test.  Sexually transmitted disease (STD) testing.  Diabetes screening. This is done by checking your blood sugar (glucose) after you have not eaten for a while (fasting). You may have this done every 1-3 years.  Abdominal aortic aneurysm (AAA) screening. You may need this if you are a current or former smoker.  Osteoporosis. You may be screened starting at age 43 if you are at high risk. Talk with your  health care provider about your test results, treatment options, and if necessary, the need for more tests. Vaccines  Your health care provider may recommend certain vaccines, such as:  Influenza vaccine. This is recommended every year.  Tetanus, diphtheria, and acellular  pertussis (Tdap, Td) vaccine. You may need a Td booster every 10 years.  Zoster vaccine. You may need this after age 103.  Pneumococcal 13-valent conjugate (PCV13) vaccine. One dose is recommended after age 51.  Pneumococcal polysaccharide (PPSV23) vaccine. One dose is recommended after age 34. Talk to your health care provider about which screenings and vaccines you need and how often you need them. This information is not intended to replace advice given to you by your health care provider. Make sure you discuss any questions you have with your health care provider. Document Released: 08/24/2015 Document Revised: 04/16/2016 Document Reviewed: 05/29/2015 Elsevier Interactive Patient Education  2017 [employer] Prevention in the Home Falls can cause injuries. They can happen to people of all ages. There are many things you can do to make your home safe and to help prevent falls. What can I do on the outside of my home?  Regularly fix the edges of walkways and driveways and fix any cracks.  Remove anything that might make you trip as you walk through a door, such as a raised step or threshold.  Trim any bushes or trees on the path to your home.  Use bright outdoor lighting.  Clear any walking paths of anything that might make someone trip, such as rocks or tools.  Regularly check to see if handrails are loose or broken. Make sure that both sides of any steps have handrails.  Any raised decks and porches should have guardrails on the edges.  Have any leaves, snow, or ice cleared regularly.  Use sand or salt on walking paths during winter.  Clean up any spills in your garage right away. This includes oil or grease spills. What can I do in the bathroom?  Use night lights.  Install grab bars by the toilet and in the tub and shower. Do not use towel bars as grab bars.  Use non-skid mats or decals in the tub or shower.  If you need to sit down in the shower, use a plastic,  non-slip stool.  Keep the floor dry. Clean up any water that spills on the floor as soon as it happens.  Remove soap buildup in the tub or shower regularly.  Attach bath mats securely with double-sided non-slip rug tape.  Do not have throw rugs and other things on the floor that can make you trip. What can I do in the bedroom?  Use night lights.  Make sure that you have a light by your bed that is easy to reach.  Do not use any sheets or blankets that are too big for your bed. They should not hang down onto the floor.  Have a firm chair that has side arms. You can use this for support while you get dressed.  Do not have throw rugs and other things on the floor that can make you trip. What can I do in the kitchen?  Clean up any spills right away.  Avoid walking on wet floors.  Keep items that you use a lot in easy-to-reach places.  If you need to reach something above you, use a strong step stool that has a grab bar.  Keep electrical cords out of the way.  Do not use  floor polish or wax that makes floors slippery. If you must use wax, use non-skid floor wax.  Do not have throw rugs and other things on the floor that can make you trip. What can I do with my stairs?  Do not leave any items on the stairs.  Make sure that there are handrails on both sides of the stairs and use them. Fix handrails that are broken or loose. Make sure that handrails are as long as the stairways.  Check any carpeting to make sure that it is firmly attached to the stairs. Fix any carpet that is loose or worn.  Avoid having throw rugs at the top or bottom of the stairs. If you do have throw rugs, attach them to the floor with carpet tape.  Make sure that you have a light switch at the top of the stairs and the bottom of the stairs. If you do not have them, ask someone to add them for you. What else can I do to help prevent falls?  Wear shoes that:  Do not have high heels.  Have rubber  bottoms.  Are comfortable and fit you well.  Are closed at the toe. Do not wear sandals.  If you use a stepladder:  Make sure that it is fully opened. Do not climb a closed stepladder.  Make sure that both sides of the stepladder are locked into place.  Ask someone to hold it for you, if possible.  Clearly mark and make sure that you can see:  Any grab bars or handrails.  First and last steps.  Where the edge of each step is.  Use tools that help you move around (mobility aids) if they are needed. These include:  Canes.  Walkers.  Scooters.  Crutches.  Turn on the lights when you go into a dark area. Replace any light bulbs as soon as they burn out.  Set up your furniture so you have a clear path. Avoid moving your furniture around.  If any of your floors are uneven, fix them.  If there are any pets around you, be aware of where they are.  Review your medicines with your doctor. Some medicines can make you feel dizzy. This can increase your chance of falling. Ask your doctor what other things that you can do to help prevent falls. This information is not intended to replace advice given to you by your health care provider. Make sure you discuss any questions you have with your health care provider. Document Released: 05/24/2009 Document Revised: 01/03/2016 Document Reviewed: 09/01/2014 Elsevier Interactive Patient Education  2017 [employer].

## 2018-05-26 NOTE — Progress Notes (Signed)
Subjective:   Warren [name] is a 72 y.o. male who presents for Medicare Annual/Subsequent preventive examination.  Review of Systems:  N/A Cardiac Risk Factors include: diabetes mellitus;advanced age (>20men, >84 women);hypertension;obesity (BMI >30kg/m2)     Objective:    Vitals: BP 122/70 (BP Location: Left Arm)   Pulse (!) 58   Temp 98.5 F (36.9 C)   Ht 5\' 8"  (1.727 m)   Wt 204 lb (92.5 kg)   BMI 31.02 kg/m   Body mass index is 31.02 kg/m.  Advanced Directives 05/26/2018  Does Patient Have a Medical Advance Directive? No  Would patient like information on creating a medical advance directive? Yes (MAU/Ambulatory/Procedural Areas - Information given)    Tobacco Social History   Tobacco Use  Smoking Status Never Smoker  Smokeless Tobacco Never Used     Counseling given: Not Answered   Clinical Intake:  Pre-visit preparation completed: Yes  Pain : No/denies pain Pain Score: 0-No pain     Nutritional Status: BMI > 30  Obese Nutritional Risks: None Diabetes: Yes CBG done?: No  How often do you need to have someone help you when you read instructions, pamphlets, or other written materials from your doctor or pharmacy?: 1 - Never What is the last grade level you completed in school?: 2 years college  Interpreter Needed?: No  Information entered by :: NAllen LPN  Past Medical History:  Diagnosis Date  . Chronic kidney disease    stage 2  . Diabetes mellitus without complication (HCC)   . Hypertension   . Malaise and fatigue    Past Surgical History:  Procedure Laterality Date  . surgical repair     metatarsal repair.    Family History  Problem Relation Age of Onset  . Hypertension Mother   . Alzheimer's disease Father   . Prostate cancer Father   . Prostate cancer Brother    Social History   Socioeconomic History  . Marital status: Single    Spouse name: Not on file  . Number of children: Not on file  . Years of education: Not on file   . Highest education level: Not on file  Occupational History  . Occupation: retired  Engineer, production  . Financial resource strain: Not hard at all  . Food insecurity:    Worry: Never true    Inability: Never true  . Transportation needs:    Medical: No    Non-medical: No  Tobacco Use  . Smoking status: Never Smoker  . Smokeless tobacco: Never Used  Substance and Sexual Activity  . Alcohol use: Yes    Alcohol/week: 3.0 standard drinks    Types: 3 Cans of beer per week    Comment: daily  . Drug use: Yes    Types: Marijuana  . Sexual activity: Yes  Lifestyle  . Physical activity:    Days per week: 0 days    Minutes per session: 0 min  . Stress: Not at all  Relationships  . Social connections:    Talks on phone: Not on file    Gets together: Not on file    Attends religious service: Not on file    Active member of club or organization: Not on file    Attends meetings of clubs or organizations: Not on file    Relationship status: Not on file  Other Topics Concern  . Not on file  Social History Narrative  . Not on file    Outpatient Encounter Medications as of  05/26/2018  Medication Sig  . amLODipine (NORVASC) 10 MG tablet Take 10 mg by mouth daily.  Marland Kitchen atorvastatin (LIPITOR) 20 MG tablet Take 20 mg by mouth daily.  . clonazePAM (KLONOPIN) 0.5 MG tablet Take 1 tablet (0.5 mg total) by mouth at bedtime. For REM Behavior Disorder (Patient taking differently: Take 0.5 mg by mouth as needed. For REM Behavior Disorder)  . Magnesium 250 MG TABS Take 1 tablet by mouth daily.  . sildenafil (VIAGRA) 100 MG tablet Take 100 mg by mouth as needed for erectile dysfunction.  . sitaGLIPtin-metformin (JANUMET) 50-1000 MG tablet Take 1 tablet by mouth daily.  . valsartan-hydrochlorothiazide (DIOVAN-HCT) 320-25 MG tablet Take 1 tablet by mouth daily.  . sitaGLIPtin-metformin (JANUMET) 50-500 MG tablet Take 1 tablet by mouth daily.   No facility-administered encounter medications on file as  of 05/26/2018.     Activities of Daily Living In your present state of health, do you have any difficulty performing the following activities: 05/26/2018  Hearing? N  Vision? N  Comment Just use light store reading glasses  Difficulty concentrating or making decisions? N  Walking or climbing stairs? N  Dressing or bathing? N  Doing errands, shopping? N  Preparing Food and eating ? N  Using the Toilet? N  In the past six months, have you accidently leaked urine? N  Do you have problems with loss of bowel control? N  Managing your Medications? N  Managing your Finances? N  Housekeeping or managing your Housekeeping? N  Some recent data might be hidden    Patient Care Team: Dorothyann Peng, MD as PCP - General (Internal Medicine)   Assessment:   This is a routine wellness examination for Warren [name].  Exercise Activities and Dietary recommendations    Goals    . DIET - INCREASE WATER INTAKE (pt-stated)     Patient would like to increase amount of water daily.       Fall Risk Fall Risk  05/26/2018 03/10/2018  Falls in the past year? No No  Comment - Emmi Telephone Survey: data to providers prior to load  Risk for fall due to : Medication side effect -   Is the patient's home free of loose throw rugs in walkways, pet beds, electrical cords, etc?   yes      Grab bars in the bathroom? no      Handrails on the stairs?   N/A      Adequate lighting?   yes  Timed Get Up and Go Performed: N/A  Depression Screen PHQ 2/9 Scores 05/26/2018  PHQ - 2 Score 0    Cognitive Function     6CIT Screen 05/26/2018  What Year? 0 points  What month? 0 points  What time? 0 points  Count back from 20 0 points  Months in reverse 0 points  Repeat phrase 0 points  Total Score 0    Immunization History  Administered Date(s) Administered  . Tdap 07/19/2013    Qualifies for Shingles Vaccine? yes  Screening Tests Health Maintenance  Topic Date Due  . HEMOGLOBIN A1C  [birth year]  .  Hepatitis C Screening  1946/02/13  . FOOT EXAM  04/14/1958  . OPHTHALMOLOGY EXAM  04/14/1958  . INFLUENZA VACCINE  05/27/2019 (Originally 03/11/2018)  . COLONOSCOPY  05/27/2019 (Originally 04/14/1998)  . PNA vac Low Risk Adult (1 of 2 - PCV13) 05/27/2019 (Originally 04/14/2013)  . TETANUS/TDAP  07/20/2023   Cancer Screenings: Lung: Low Dose CT Chest recommended if Age 27-80 years, 30  pack-year currently smoking OR have quit w/in 15years. Patient does not qualify. Colorectal: due for FOBT in 2020  Additional Screenings:  Hepatitis C Screening:due      Plan:    Patient is working on increasing daily water in take. He is not interested in immunizations nor colonoscopy. Due for  FOBT in 2020. Patient has seen eye doctor for the first time this year and has an appointment for next year.  I have personally reviewed and noted the following in the patient's chart:   . Medical and social history . Use of alcohol, tobacco or illicit drugs  . Current medications and supplements . Functional ability and status . Nutritional status . Physical activity . Advanced directives . List of other physicians . Hospitalizations, surgeries, and ER visits in previous 12 months . Vitals . Screenings to include cognitive, depression, and falls . Referrals and appointments  In addition, I have reviewed and discussed with patient certain preventive protocols, quality metrics, and best practice recommendations. A written personalized care plan for preventive services as well as general preventive health recommendations were provided to patient.     Barb Merino, LPN  [id number]/05/[id number]

## 2018-07-01 ENCOUNTER — Other Ambulatory Visit: Payer: Self-pay

## 2018-07-06 ENCOUNTER — Ambulatory Visit (INDEPENDENT_AMBULATORY_CARE_PROVIDER_SITE_OTHER): Payer: Medicare Other | Admitting: Internal Medicine

## 2018-07-06 ENCOUNTER — Encounter: Payer: Self-pay | Admitting: Internal Medicine

## 2018-07-06 VITALS — BP 116/78 | HR 72 | Temp 98.8°F | Ht 68.0 in | Wt 206.6 lb

## 2018-07-06 DIAGNOSIS — I129 Hypertensive chronic kidney disease with stage 1 through stage 4 chronic kidney disease, or unspecified chronic kidney disease: Secondary | ICD-10-CM

## 2018-07-06 DIAGNOSIS — E7800 Pure hypercholesterolemia, unspecified: Secondary | ICD-10-CM | POA: Diagnosis not present

## 2018-07-06 DIAGNOSIS — E1122 Type 2 diabetes mellitus with diabetic chronic kidney disease: Secondary | ICD-10-CM

## 2018-07-06 DIAGNOSIS — E6609 Other obesity due to excess calories: Secondary | ICD-10-CM | POA: Diagnosis not present

## 2018-07-06 DIAGNOSIS — N182 Chronic kidney disease, stage 2 (mild): Secondary | ICD-10-CM

## 2018-07-06 DIAGNOSIS — Z6831 Body mass index (BMI) 31.0-31.9, adult: Secondary | ICD-10-CM | POA: Diagnosis not present

## 2018-07-06 DIAGNOSIS — E785 Hyperlipidemia, unspecified: Secondary | ICD-10-CM

## 2018-07-06 MED ORDER — AMLODIPINE BESYLATE 10 MG PO TABS: 10.0000 mg | ORAL_TABLET | Freq: Every day | ORAL | 2 refills | Status: DC

## 2018-07-06 MED ORDER — ATORVASTATIN CALCIUM 20 MG PO TABS: 20.0000 mg | ORAL_TABLET | Freq: Every day | ORAL | 2 refills | Status: DC

## 2018-07-06 NOTE — Patient Instructions (Signed)

## 2018-07-06 NOTE — Progress Notes (Signed)
Subjective:     Patient ID: Warren [name] , male    DOB: 30-Oct-1945 , 72 y.o.   MRN: [id number]   Chief Complaint  Patient presents with  . Diabetes  . Hypertension    HPI  Diabetes  He presents for his follow-up diabetic visit. He has type 2 diabetes mellitus. His disease course has been stable. There are no hypoglycemic associated symptoms. Pertinent negatives for diabetes include no chest pain, no fatigue and no weight loss. There are no hypoglycemic complications. Diabetic complications include nephropathy. Risk factors for coronary artery disease include diabetes mellitus, dyslipidemia, hypertension and male sex. He is compliant with treatment most of the time.  Hypertension  This is a chronic problem. The current episode started more than 1 year ago. The problem has been gradually improving since onset. The problem is controlled. Pertinent negatives include no chest pain.   He is no longer taking his bp medication. He is now taking an "herbal" medication.   Past Medical History:  Diagnosis Date  . Chronic kidney disease    stage 2  . Diabetes mellitus without complication ([place])   . Hypertension   . Malaise and fatigue      Family History  Problem Relation Age of Onset  . Hypertension Mother   . Hypothyroidism Mother   . Alzheimer's disease Father   . Prostate cancer Father   . Prostate cancer Brother      Current Outpatient Medications:  .  amLODipine (NORVASC) 10 MG tablet, Take 1 tablet (10 mg total) by mouth daily., Disp: 90 tablet, Rfl: 2 .  atorvastatin (LIPITOR) 20 MG tablet, Take 1 tablet (20 mg total) by mouth daily., Disp: 90 tablet, Rfl: 2 .  Magnesium 250 MG TABS, Take 1 tablet by mouth daily., Disp: , Rfl:  .  sildenafil (VIAGRA) 100 MG tablet, Take 100 mg by mouth as needed for erectile dysfunction., Disp: , Rfl:  .  sitaGLIPtin-metformin (JANUMET) 50-1000 MG tablet, Take 1 tablet by mouth daily., Disp: , Rfl:  .  telmisartan-hydrochlorothiazide (MICARDIS  HCT) 80-12.5 MG tablet, Take 1 tablet by mouth daily., Disp: , Rfl:    No Known Allergies   Review of Systems  Constitutional: Negative.  Negative for fatigue and weight loss.  Respiratory: Negative.   Cardiovascular: Negative.  Negative for chest pain.  Gastrointestinal: Negative.   Neurological: Negative.   Psychiatric/Behavioral: Negative.      Today's Vitals   07/06/18 0956  BP: 116/78  Pulse: 72  Temp: 98.8 F (37.1 C)  TempSrc: Oral  Weight: 206 lb 9.6 oz (93.7 kg)  Height: '5\' 8"'$  (1.727 m)  PainSc: 0-No pain   Body mass index is 31.41 kg/m.   Objective:  Physical Exam  Constitutional: He is oriented to person, place, and time. He appears well-developed and well-nourished.  Eyes: Pupils are equal, round, and reactive to light. EOM are normal.  Cardiovascular: Normal rate, regular rhythm and normal heart sounds.  Pulmonary/Chest: Effort normal and breath sounds normal.  Neurological: He is alert and oriented to person, place, and time.  Psychiatric: He has a normal mood and affect.  Nursing note and vitals reviewed.       Assessment And Plan:     1. Type 2 diabetes mellitus with stage 2 chronic kidney disease, without long-term current use of insulin ([place])  I will check labs as listed below. He is encouraged to check his sugars if he ever feels "bad" - lightheaded, dizzy, weak, etc.   - CMP14+EGFR -  Hemoglobin A1c - Lipid Profile - Hepatitis C antibody  2. Chronic renal disease, stage II  Chronic. I will check a GFR, cr today. He is encouraged to stay well hydrated.   3. Hypertensive nephropathy  Well controlled off of medications. I advised him of importance of renal protection in setting of diabetes. He is not interested to resume lower dose of ARB.   4. Pure hypercholesterolemia  I will check a fasting lipid panel and LFTs today. He was also given a refill of atorvastatin.   5. Class 1 obesity due to excess calories with serious comorbidity and  body mass index (BMI) of 31.0 to 31.9 in adult  He is encouraged to strive for BMI less than 29 to decrease cardiac risk. He is encouraged to incorporate more exercise into his daily routine.   Maximino Greenland, MD

## 2018-07-07 ENCOUNTER — Encounter: Payer: Self-pay | Admitting: Internal Medicine

## 2018-07-07 LAB — CMP14+EGFR
ALT: 17 IU/L (ref 0–44)
AST: 15 IU/L (ref 0–40)
Albumin/Globulin Ratio: 1.8 (ref 1.2–2.2)
Albumin: 4.5 g/dL (ref 3.5–4.8)
Alkaline Phosphatase: 50 IU/L (ref 39–117)
BUN/Creatinine Ratio: 11 (ref 10–24)
BUN: 15 mg/dL (ref 8–27)
Bilirubin Total: 0.8 mg/dL (ref 0.0–1.2)
CO2: 20 mmol/L (ref 20–29)
Calcium: 10.3 mg/dL — ABNORMAL HIGH (ref 8.6–10.2)
Chloride: 106 mmol/L (ref 96–106)
Creatinine, Ser: 1.34 mg/dL — ABNORMAL HIGH (ref 0.76–1.27)
GFR calc Af Amer: 62 mL/min/{1.73_m2} (ref >59)
GFR calc non Af Amer: 53 mL/min/{1.73_m2} — ABNORMAL LOW (ref >59)
Globulin, Total: 2.5 g/dL (ref 1.5–4.5)
Glucose: 157 mg/dL — ABNORMAL HIGH (ref 65–99)
Potassium: 4.7 mmol/L (ref 3.5–5.2)
Sodium: 143 mmol/L (ref 134–144)
Total Protein: 7 g/dL (ref 6.0–8.5)

## 2018-07-07 LAB — LIPID PANEL
Chol/HDL Ratio: 2.4 ratio (ref 0.0–5.0)
Cholesterol, Total: 182 mg/dL (ref 100–199)
HDL: 75 mg/dL (ref >39)
LDL Calculated: 88 mg/dL (ref 0–99)
Triglycerides: 96 mg/dL (ref 0–149)
VLDL Cholesterol Cal: 19 mg/dL (ref 5–40)

## 2018-07-07 LAB — HEMOGLOBIN A1C
Est. average glucose Bld gHb Est-mCnc: 177 mg/dL
Hgb A1c MFr Bld: 7.8 % — ABNORMAL HIGH (ref 4.8–5.6)

## 2018-07-07 LAB — HEPATITIS C ANTIBODY: Hep C Virus Ab: <0.1 s/co ratio (ref 0.0–0.9)

## 2018-07-07 NOTE — Progress Notes (Signed)
Your liver fxn is nl. Your kidney fxn is stable. pls be sure to stay well hydrated. Your calcium level is sl. Elevated.  This could be due to dehydration. pls increase water intake. Your hba1c is 7.8, this is fairly good. Kw-pls abstract last hba1c, lipid and bmet.

## 2018-08-18 ENCOUNTER — Telehealth: Payer: Self-pay

## 2018-08-18 NOTE — Telephone Encounter (Signed)
Left the pt a message that I was giving him a call because I got a message that the pt needed a call.

## 2018-10-06 ENCOUNTER — Ambulatory Visit: Payer: Medicare Other | Admitting: Internal Medicine

## 2018-10-13 ENCOUNTER — Ambulatory Visit (INDEPENDENT_AMBULATORY_CARE_PROVIDER_SITE_OTHER): Payer: Medicare Other | Admitting: Internal Medicine

## 2018-10-13 ENCOUNTER — Encounter: Payer: Self-pay | Admitting: Internal Medicine

## 2018-10-13 ENCOUNTER — Other Ambulatory Visit: Payer: Self-pay

## 2018-10-13 VITALS — BP 112/78 | HR 76 | Temp 98.0°F | Ht 68.0 in | Wt 207.8 lb

## 2018-10-13 DIAGNOSIS — Z1211 Encounter for screening for malignant neoplasm of colon: Secondary | ICD-10-CM

## 2018-10-13 DIAGNOSIS — N182 Chronic kidney disease, stage 2 (mild): Secondary | ICD-10-CM

## 2018-10-13 DIAGNOSIS — E1122 Type 2 diabetes mellitus with diabetic chronic kidney disease: Secondary | ICD-10-CM

## 2018-10-13 DIAGNOSIS — I129 Hypertensive chronic kidney disease with stage 1 through stage 4 chronic kidney disease, or unspecified chronic kidney disease: Secondary | ICD-10-CM | POA: Diagnosis not present

## 2018-10-13 DIAGNOSIS — L299 Pruritus, unspecified: Secondary | ICD-10-CM

## 2018-10-13 MED ORDER — GLUCOSE BLOOD VI STRP: ORAL_STRIP | 11 refills | Status: DC

## 2018-10-13 MED ORDER — ONETOUCH ULTRA 2 W/DEVICE KIT: PACK | 1 refills | Status: DC

## 2018-10-13 MED ORDER — SILDENAFIL CITRATE 100 MG PO TABS: 100.0000 mg | ORAL_TABLET | ORAL | 2 refills | Status: DC | PRN

## 2018-10-13 MED ORDER — SILDENAFIL CITRATE 20 MG PO TABS: 20.0000 mg | ORAL_TABLET | ORAL | 2 refills | Status: DC | PRN

## 2018-10-14 LAB — CBC WITH DIFFERENTIAL/PLATELET
Basophils Absolute: 0.1 10*3/uL (ref 0.0–0.2)
Basos: 1 %
EOS (ABSOLUTE): 0.1 10*3/uL (ref 0.0–0.4)
Eos: 1 %
Hematocrit: 42.6 % (ref 37.5–51.0)
Hemoglobin: 14.6 g/dL (ref 13.0–17.7)
Immature Grans (Abs): 0 10*3/uL (ref 0.0–0.1)
Immature Granulocytes: 1 %
Lymphocytes Absolute: 4.4 10*3/uL — ABNORMAL HIGH (ref 0.7–3.1)
Lymphs: 50 %
MCH: 30 pg (ref 26.6–33.0)
MCHC: 34.3 g/dL (ref 31.5–35.7)
MCV: 88 fL (ref 79–97)
Monocytes Absolute: 0.6 10*3/uL (ref 0.1–0.9)
Monocytes: 7 %
Neutrophils Absolute: 3.6 10*3/uL (ref 1.4–7.0)
Neutrophils: 40 %
Platelets: 186 10*3/uL (ref 150–450)
RBC: 4.86 x10E6/uL (ref 4.14–5.80)
RDW: 13.5 % (ref 11.6–15.4)
WBC: 8.9 10*3/uL (ref 3.4–10.8)

## 2018-10-14 LAB — CMP14+EGFR
ALT: 16 IU/L (ref 0–44)
AST: 15 IU/L (ref 0–40)
Albumin/Globulin Ratio: 1.6 (ref 1.2–2.2)
Albumin: 4.4 g/dL (ref 3.8–4.8)
Alkaline Phosphatase: 63 IU/L (ref 39–117)
BUN/Creatinine Ratio: 9 — ABNORMAL LOW (ref 10–24)
BUN: 12 mg/dL (ref 8–27)
Bilirubin Total: 0.8 mg/dL (ref 0.0–1.2)
CO2: 20 mmol/L (ref 20–29)
Calcium: 10.2 mg/dL (ref 8.6–10.2)
Chloride: 102 mmol/L (ref 96–106)
Creatinine, Ser: 1.27 mg/dL (ref 0.76–1.27)
GFR calc Af Amer: 66 mL/min/{1.73_m2} (ref >59)
GFR calc non Af Amer: 57 mL/min/{1.73_m2} — ABNORMAL LOW (ref >59)
Globulin, Total: 2.8 g/dL (ref 1.5–4.5)
Glucose: 133 mg/dL — ABNORMAL HIGH (ref 65–99)
Potassium: 4.6 mmol/L (ref 3.5–5.2)
Sodium: 143 mmol/L (ref 134–144)
Total Protein: 7.2 g/dL (ref 6.0–8.5)

## 2018-10-14 LAB — HEMOGLOBIN A1C
Est. average glucose Bld gHb Est-mCnc: 186 mg/dL
Hgb A1c MFr Bld: 8.1 % — ABNORMAL HIGH (ref 4.8–5.6)

## 2018-10-22 ENCOUNTER — Other Ambulatory Visit: Payer: Self-pay

## 2018-10-22 MED ORDER — AMLODIPINE BESYLATE 10 MG PO TABS: 10.0000 mg | ORAL_TABLET | Freq: Every day | ORAL | 2 refills | Status: DC

## 2018-10-22 MED ORDER — ATORVASTATIN CALCIUM 20 MG PO TABS: 20.0000 mg | ORAL_TABLET | Freq: Every day | ORAL | 2 refills | Status: DC

## 2018-10-31 NOTE — Progress Notes (Signed)
Subjective:     Patient ID: Warren [name] , male    DOB: [birth year] , 73 y.o.   MRN: [id number]   Chief Complaint  Patient presents with  . Diabetes  . Hypertension    HPI  Diabetes  He presents for his follow-up diabetic visit. He has type 2 diabetes mellitus. There are no hypoglycemic associated symptoms. Pertinent negatives for diabetes include no blurred vision and no chest pain. There are no hypoglycemic complications. Diabetic complications include nephropathy. Risk factors for coronary artery disease include diabetes mellitus, dyslipidemia, hypertension, male sex and sedentary lifestyle. He never participates in exercise. An ACE inhibitor/angiotensin II receptor blocker is being taken. He does not see a podiatrist.Eye exam is current.  Hypertension  This is a chronic problem. The current episode started more than 1 year ago. The problem has been gradually improving since onset. The problem is controlled. Pertinent negatives include no blurred vision, chest pain, palpitations or shortness of breath.   He reports compliance with meds.   Past Medical History:  Diagnosis Date  . Chronic kidney disease    stage 2  . Diabetes mellitus without complication ([place])   . Hypertension   . Malaise and fatigue      Family History  Problem Relation Age of Onset  . Hypertension Mother   . Hypothyroidism Mother   . Alzheimer's disease Father   . Prostate cancer Father   . Prostate cancer Brother      Current Outpatient Medications:  .  glucose blood (ONE TOUCH ULTRA TEST) test strip, Use as instructed to check blood sugars 1 time per day dx: e11.65, Disp: 50 each, Rfl: 11 .  sildenafil (REVATIO) 20 MG tablet, Take 1 tablet (20 mg total) by mouth as needed., Disp: 50 tablet, Rfl: 2 .  sitaGLIPtin-metformin (JANUMET) 50-1000 MG tablet, Take 1 tablet by mouth daily., Disp: , Rfl:  .  telmisartan-hydrochlorothiazide (MICARDIS HCT) 80-12.5 MG tablet, Take 1 tablet by mouth daily., Disp: ,  Rfl:  .  amLODipine (NORVASC) 10 MG tablet, Take 1 tablet (10 mg total) by mouth daily., Disp: 90 tablet, Rfl: 2 .  atorvastatin (LIPITOR) 20 MG tablet, Take 1 tablet (20 mg total) by mouth daily., Disp: 90 tablet, Rfl: 2 .  Blood Glucose Monitoring Suppl (ONE TOUCH ULTRA 2) w/Device KIT, Use as directed to check blood sugars 1 time per day dx: e11.65, Disp: 1 each, Rfl: 1 .  Magnesium 250 MG TABS, Take 1 tablet by mouth daily., Disp: , Rfl:    No Known Allergies   Review of Systems  Constitutional: Negative.   Eyes: Negative for blurred vision.  Respiratory: Negative.  Negative for shortness of breath.   Cardiovascular: Negative.  Negative for chest pain and palpitations.  Gastrointestinal: Negative.   Skin:       He c/o itching. It is generalized. He is not sure what causes his sx. He is unable to identify a specific trigger.   Neurological: Negative.   Psychiatric/Behavioral: Negative.      Today's Vitals   10/13/18 1121  BP: 112/78  Pulse: 76  Temp: 98 F (36.7 C)  TempSrc: Oral  Weight: 207 lb 12.8 oz (94.3 kg)  Height: '5\' 8"'$  (1.727 m)  PainSc: 0-No pain   Body mass index is 31.6 kg/m.   Objective:  Physical Exam Vitals signs and nursing note reviewed.  Constitutional:      Appearance: Normal appearance.  Cardiovascular:     Rate and Rhythm: Normal rate and regular rhythm.  Heart sounds: Normal heart sounds.  Pulmonary:     Effort: Pulmonary effort is normal.     Breath sounds: Normal breath sounds.  Skin:    General: Skin is warm.  Neurological:     General: No focal deficit present.     Mental Status: He is alert.  Psychiatric:        Mood and Affect: Mood normal.         Assessment And Plan:     1. Type 2 diabetes mellitus with stage 2 chronic kidney disease, without long-term current use of insulin ([place])  I will check labs as listed below.  Importance of dietary, medication and exercise compliance was discussed with the patient.   -  CMP14+EGFR - Hemoglobin A1c  2. Hypertensive nephropathy  Well controlled. He will continue with current meds. He is encouraged to avoid adding salt to his foods.   3. Pruritus  I will check labs as listed below. I will also check LFTs. He will let me know if his symptoms persist. He is encouraged to use a moisturizer after bathing. NO rash noted on exam.   - CBC with Diff  4. Screen for colon cancer  He refuses colonoscopy. He does agree to [employer].   - Cologuard        Maximino Greenland, MD

## 2018-10-31 NOTE — Patient Instructions (Signed)
Diabetes Mellitus and Exercise Exercising regularly is important for your overall health, especially when you have diabetes (diabetes mellitus). Exercising is not only about losing weight. It has many other health benefits, such as increasing muscle strength and bone density and reducing body fat and stress. This leads to improved fitness, flexibility, and endurance, all of which result in better overall health. Exercise has additional benefits for people with diabetes, including:  Reducing appetite.  Helping to lower and control blood glucose.  Lowering blood pressure.  Helping to control amounts of fatty substances (lipids) in the blood, such as cholesterol and triglycerides.  Helping the body to respond better to insulin (improving insulin sensitivity).  Reducing how much insulin the body needs.  Decreasing the risk for heart disease by: ? Lowering cholesterol and triglyceride levels. ? Increasing the levels of good cholesterol. ? Lowering blood glucose levels. What is my activity plan? Your health care provider or certified diabetes educator can help you make a plan for the type and frequency of exercise (activity plan) that works for you. Make sure that you:  Do at least 150 minutes of moderate-intensity or vigorous-intensity exercise each week. This could be brisk walking, biking, or water aerobics. ? Do stretching and strength exercises, such as yoga or weightlifting, at least 2 times a week. ? Spread out your activity over at least 3 days of the week.  Get some form of physical activity every day. ? Do not go more than 2 days in a row without some kind of physical activity. ? Avoid being inactive for more than 30 minutes at a time. Take frequent breaks to walk or stretch.  Choose a type of exercise or activity that you enjoy, and set realistic goals.  Start slowly, and gradually increase the intensity of your exercise over time. What do I need to know about managing my  diabetes?   Check your blood glucose before and after exercising. ? If your blood glucose is 240 mg/dL (13.3 mmol/L) or higher before you exercise, check your urine for ketones. If you have ketones in your urine, do not exercise until your blood glucose returns to normal. ? If your blood glucose is 100 mg/dL (5.6 mmol/L) or lower, eat a snack containing 15-20 grams of carbohydrate. Check your blood glucose 15 minutes after the snack to make sure that your level is above 100 mg/dL (5.6 mmol/L) before you start your exercise.  Know the symptoms of low blood glucose (hypoglycemia) and how to treat it. Your risk for hypoglycemia increases during and after exercise. Common symptoms of hypoglycemia can include: ? Hunger. ? Anxiety. ? Sweating and feeling clammy. ? Confusion. ? Dizziness or feeling light-headed. ? Increased heart rate or palpitations. ? Blurry vision. ? Tingling or numbness around the mouth, lips, or tongue. ? Tremors or shakes. ? Irritability.  Keep a rapid-acting carbohydrate snack available before, during, and after exercise to help prevent or treat hypoglycemia.  Avoid injecting insulin into areas of the body that are going to be exercised. For example, avoid injecting insulin into: ? The arms, when playing tennis. ? The legs, when jogging.  Keep records of your exercise habits. Doing this can help you and your health care provider adjust your diabetes management plan as needed. Write down: ? Food that you eat before and after you exercise. ? Blood glucose levels before and after you exercise. ? The type and amount of exercise you have done. ? When your insulin is expected to peak, if you use   insulin. Avoid exercising at times when your insulin is peaking.  When you start a new exercise or activity, work with your health care provider to make sure the activity is safe for you, and to adjust your insulin, medicines, or food intake as needed.  Drink plenty of water while  you exercise to prevent dehydration or heat stroke. Drink enough fluid to keep your urine clear or pale yellow. Summary  Exercising regularly is important for your overall health, especially when you have diabetes (diabetes mellitus).  Exercising has many health benefits, such as increasing muscle strength and bone density and reducing body fat and stress.  Your health care provider or certified diabetes educator can help you make a plan for the type and frequency of exercise (activity plan) that works for you.  When you start a new exercise or activity, work with your health care provider to make sure the activity is safe for you, and to adjust your insulin, medicines, or food intake as needed. This information is not intended to replace advice given to you by your health care provider. Make sure you discuss any questions you have with your health care provider. Document Released: 10/18/2003 Document Revised: 02/05/2017 Document Reviewed: 01/07/2016 Elsevier Interactive Patient Education  2019 Elsevier Inc.  

## 2018-11-10 DIAGNOSIS — Z1212 Encounter for screening for malignant neoplasm of rectum: Secondary | ICD-10-CM | POA: Diagnosis not present

## 2018-11-10 DIAGNOSIS — Z1211 Encounter for screening for malignant neoplasm of colon: Secondary | ICD-10-CM | POA: Diagnosis not present

## 2018-11-10 LAB — COLOGUARD: Cologuard: POSITIVE — AB

## 2018-11-17 ENCOUNTER — Other Ambulatory Visit: Payer: Self-pay

## 2018-11-17 ENCOUNTER — Encounter: Payer: Self-pay | Admitting: Internal Medicine

## 2018-11-17 ENCOUNTER — Telehealth: Payer: Self-pay

## 2018-11-17 DIAGNOSIS — Z1211 Encounter for screening for malignant neoplasm of colon: Secondary | ICD-10-CM

## 2018-11-17 NOTE — Telephone Encounter (Signed)
Patient notified that his cologuard is positive and that Dr. Allyne Gee is going to refer the pt for a colonoscopy to Dr. Rolla Etienne office.

## 2018-12-09 ENCOUNTER — Other Ambulatory Visit: Payer: Self-pay

## 2018-12-13 ENCOUNTER — Telehealth: Payer: Self-pay | Admitting: Internal Medicine

## 2018-12-13 NOTE — Telephone Encounter (Signed)
PT REQ SAMPLE OF JANUMET 50-1000MG 

## 2018-12-13 NOTE — Telephone Encounter (Signed)
CALLED PT TO [facility] WE DO HAVE SAMPLES OF JANUMET 50-1000MG  XR

## 2018-12-13 NOTE — Telephone Encounter (Signed)
Did you give them to him? I thought he needed a prescription for something.

## 2019-02-03 ENCOUNTER — Other Ambulatory Visit: Payer: Self-pay

## 2019-02-03 ENCOUNTER — Ambulatory Visit (INDEPENDENT_AMBULATORY_CARE_PROVIDER_SITE_OTHER): Payer: Medicare Other | Admitting: Internal Medicine

## 2019-02-03 ENCOUNTER — Encounter: Payer: Self-pay | Admitting: Internal Medicine

## 2019-02-03 VITALS — BP 114/60 | HR 58 | Temp 98.2°F | Ht 68.0 in | Wt 206.0 lb

## 2019-02-03 DIAGNOSIS — I129 Hypertensive chronic kidney disease with stage 1 through stage 4 chronic kidney disease, or unspecified chronic kidney disease: Secondary | ICD-10-CM

## 2019-02-03 DIAGNOSIS — M5442 Lumbago with sciatica, left side: Secondary | ICD-10-CM

## 2019-02-03 DIAGNOSIS — N182 Chronic kidney disease, stage 2 (mild): Secondary | ICD-10-CM

## 2019-02-03 DIAGNOSIS — R195 Other fecal abnormalities: Secondary | ICD-10-CM | POA: Diagnosis not present

## 2019-02-03 DIAGNOSIS — R202 Paresthesia of skin: Secondary | ICD-10-CM | POA: Diagnosis not present

## 2019-02-03 DIAGNOSIS — E1122 Type 2 diabetes mellitus with diabetic chronic kidney disease: Secondary | ICD-10-CM | POA: Diagnosis not present

## 2019-02-03 NOTE — Patient Instructions (Signed)
Acute Back Pain, Adult  Acute back pain is sudden and usually short-lived. It is often caused by an injury to the muscles and tissues in the back. The injury may result from:   A muscle or ligament getting overstretched or torn (strained). Ligaments are tissues that connect bones to each other. Lifting something improperly can cause a back strain.   Wear and tear (degeneration) of the spinal disks. Spinal disks are circular tissue that provides cushioning between the bones of the spine (vertebrae).   Twisting motions, such as while playing sports or doing yard work.   A hit to the back.   Arthritis.  You may have a physical exam, lab tests, and imaging tests to find the cause of your pain. Acute back pain usually goes away with rest and home care.  Follow these instructions at home:  Managing pain, stiffness, and swelling   Take over-the-counter and prescription medicines only as told by your health care provider.   Your health care provider may recommend applying ice during the first 24-48 hours after your pain starts. To do this:  ? Put ice in a plastic bag.  ? Place a towel between your skin and the bag.  ? Leave the ice on for 20 minutes, 2-3 times a day.   If directed, apply heat to the affected area as often as told by your health care provider. Use the heat source that your health care provider recommends, such as a moist heat pack or a heating pad.  ? Place a towel between your skin and the heat source.  ? Leave the heat on for 20-30 minutes.  ? Remove the heat if your skin turns bright red. This is especially important if you are unable to feel pain, heat, or cold. You have a greater risk of getting burned.  Activity     Do not stay in bed. Staying in bed for more than 1-2 days can delay your recovery.   Sit up and stand up straight. Avoid leaning forward when you sit, or hunching over when you stand.  ? If you work at a desk, sit close to it so you do not need to lean over. Keep your chin tucked  in. Keep your neck drawn back, and keep your elbows bent at a right angle. Your arms should look like the letter "L."  ? Sit high and close to the steering wheel when you drive. Add lower back (lumbar) support to your car seat, if needed.   Take short walks on even surfaces as soon as you are able. Try to increase the length of time you walk each day.   Do not sit, drive, or stand in one place for more than 30 minutes at a time. Sitting or standing for long periods of time can put stress on your back.   Do not drive or use heavy machinery while taking prescription pain medicine.   Use proper lifting techniques. When you bend and lift, use positions that put less stress on your back:  ? Bend your knees.  ? Keep the load close to your body.  ? Avoid twisting.   Exercise regularly as told by your health care provider. Exercising helps your back heal faster and helps prevent back injuries by keeping muscles strong and flexible.   Work with a physical therapist to make a safe exercise program, as recommended by your health care provider. Do any exercises as told by your physical therapist.  Lifestyle   Maintain   a healthy weight. Extra weight puts stress on your back and makes it difficult to have good posture.   Avoid activities or situations that make you feel anxious or stressed. Stress and anxiety increase muscle tension and can make back pain worse. Learn ways to manage anxiety and stress, such as through exercise.  General instructions   Sleep on a firm mattress in a comfortable position. Try lying on your side with your knees slightly bent. If you lie on your back, put a pillow under your knees.   Follow your treatment plan as told by your health care provider. This may include:  ? Cognitive or behavioral therapy.  ? Acupuncture or massage therapy.  ? Meditation or yoga.  Contact a health care provider if:   You have pain that is not relieved with rest or medicine.   You have increasing pain going down  into your legs or buttocks.   Your pain does not improve after 2 weeks.   You have pain at night.   You lose weight without trying.   You have a fever or chills.  Get help right away if:   You develop new bowel or bladder control problems.   You have unusual weakness or numbness in your arms or legs.   You develop nausea or vomiting.   You develop abdominal pain.   You feel faint.  Summary   Acute back pain is sudden and usually short-lived.   Use proper lifting techniques. When you bend and lift, use positions that put less stress on your back.   Take over-the-counter and prescription medicines and apply heat or ice as directed by your health care provider.  This information is not intended to replace advice given to you by your health care provider. Make sure you discuss any questions you have with your health care provider.  Document Released: 07/28/2005 Document Revised: 03/04/2018 Document Reviewed: 03/11/2017  Elsevier Interactive Patient Education  2019 Elsevier Inc.

## 2019-02-04 LAB — LIPID PANEL
Chol/HDL Ratio: 2.9 ratio (ref 0.0–5.0)
Cholesterol, Total: 208 mg/dL — ABNORMAL HIGH (ref 100–199)
HDL: 72 mg/dL (ref >39)
LDL Calculated: 97 mg/dL (ref 0–99)
Triglycerides: 194 mg/dL — ABNORMAL HIGH (ref 0–149)
VLDL Cholesterol Cal: 39 mg/dL (ref 5–40)

## 2019-02-04 LAB — CMP14+EGFR
ALT: 18 IU/L (ref 0–44)
AST: 15 IU/L (ref 0–40)
Albumin/Globulin Ratio: 1.7 (ref 1.2–2.2)
Albumin: 4.5 g/dL (ref 3.8–4.8)
Alkaline Phosphatase: 59 IU/L (ref 39–117)
BUN/Creatinine Ratio: 13 (ref 10–24)
BUN: 16 mg/dL (ref 8–27)
Bilirubin Total: 0.5 mg/dL (ref 0.0–1.2)
CO2: 22 mmol/L (ref 20–29)
Calcium: 9.9 mg/dL (ref 8.6–10.2)
Chloride: 101 mmol/L (ref 96–106)
Creatinine, Ser: 1.23 mg/dL (ref 0.76–1.27)
GFR calc Af Amer: 68 mL/min/{1.73_m2} (ref >59)
GFR calc non Af Amer: 59 mL/min/{1.73_m2} — ABNORMAL LOW (ref >59)
Globulin, Total: 2.7 g/dL (ref 1.5–4.5)
Glucose: 141 mg/dL — ABNORMAL HIGH (ref 65–99)
Potassium: 5 mmol/L (ref 3.5–5.2)
Sodium: 140 mmol/L (ref 134–144)
Total Protein: 7.2 g/dL (ref 6.0–8.5)

## 2019-02-04 LAB — HEMOGLOBIN A1C
Est. average glucose Bld gHb Est-mCnc: 166 mg/dL
Hgb A1c MFr Bld: 7.4 % — ABNORMAL HIGH (ref 4.8–5.6)

## 2019-02-05 NOTE — Progress Notes (Signed)
Subjective:     Patient ID: Warren [name] , male    DOB: August 26, 1945 , 73 y.o.   MRN: [id number]   Chief Complaint  Patient presents with  . Diabetes  . Hypertension    HPI  Diabetes He presents for his follow-up diabetic visit. He has type 2 diabetes mellitus. There are no hypoglycemic associated symptoms. Pertinent negatives for diabetes include no blurred vision and no chest pain. There are no hypoglycemic complications. Diabetic complications include nephropathy. Risk factors for coronary artery disease include diabetes mellitus, dyslipidemia, hypertension, male sex and sedentary lifestyle. He never participates in exercise. An ACE inhibitor/angiotensin II receptor blocker is being taken. He does not see a podiatrist.Eye exam is current.  Hypertension This is a chronic problem. The current episode started more than 1 year ago. The problem has been gradually improving since onset. The problem is controlled. Pertinent negatives include no blurred vision, chest pain, palpitations or shortness of breath. Risk factors for coronary artery disease include diabetes mellitus, dyslipidemia, male gender and sedentary lifestyle. Past treatments include angiotensin blockers and diuretics. The current treatment provides moderate improvement. Compliance problems include exercise.  Hypertensive end-organ damage includes kidney disease.     Past Medical History:  Diagnosis Date  . Chronic kidney disease    stage 2  . Diabetes mellitus without complication ([place])   . Hypertension   . Malaise and fatigue      Family History  Problem Relation Age of Onset  . Hypertension Mother   . Hypothyroidism Mother   . Alzheimer's disease Father   . Prostate cancer Father   . Prostate cancer Brother      Current Outpatient Medications:  .  amLODipine (NORVASC) 10 MG tablet, Take 1 tablet (10 mg total) by mouth daily., Disp: 90 tablet, Rfl: 2 .  atorvastatin (LIPITOR) 20 MG tablet, Take 1 tablet (20 mg  total) by mouth daily., Disp: 90 tablet, Rfl: 2 .  Blood Glucose Monitoring Suppl (ONE TOUCH ULTRA 2) w/Device KIT, Use as directed to check blood sugars 1 time per day dx: e11.65, Disp: 1 each, Rfl: 1 .  Magnesium 250 MG TABS, Take 1 tablet by mouth daily., Disp: , Rfl:  .  sildenafil (REVATIO) 20 MG tablet, Take 1 tablet (20 mg total) by mouth as needed., Disp: 50 tablet, Rfl: 2 .  sitaGLIPtin-metformin (JANUMET) 50-1000 MG tablet, Take 1 tablet by mouth daily., Disp: , Rfl:  .  telmisartan-hydrochlorothiazide (MICARDIS HCT) 80-12.5 MG tablet, Take 1 tablet by mouth daily., Disp: , Rfl:  .  glucose blood (ONE TOUCH ULTRA TEST) test strip, Use as instructed to check blood sugars 1 time per day dx: e11.65 (Patient not taking: Reported on 02/03/2019), Disp: 50 each, Rfl: 11   No Known Allergies   Review of Systems  Constitutional: Negative.   Eyes: Negative for blurred vision.  Respiratory: Negative.  Negative for shortness of breath.   Cardiovascular: Negative.  Negative for chest pain and palpitations.  Gastrointestinal: Negative.   Neurological: Negative.   Psychiatric/Behavioral: Negative.      Today's Vitals   02/03/19 1520  BP: 114/60  Pulse: (!) 58  Temp: 98.2 F (36.8 C)  TempSrc: Oral  Weight: 206 lb (93.4 kg)  Height: '5\' 8"'$  (1.727 m)  PainSc: 0-No pain   Body mass index is 31.32 kg/m.   Objective:  Physical Exam Vitals signs and nursing note reviewed.  Constitutional:      Appearance: Normal appearance.  Cardiovascular:     Rate and  Rhythm: Normal rate and regular rhythm.     Pulses:          Dorsalis pedis pulses are 2+ on the right side and 2+ on the left side.     Heart sounds: Normal heart sounds.  Pulmonary:     Effort: Pulmonary effort is normal.     Breath sounds: Normal breath sounds.  Musculoskeletal:     Comments: Negative straight leg test b/l  Feet:     Right foot:     Protective Sensation: 5 sites tested. 5 sites sensed.     Skin integrity:  Skin integrity normal.     Toenail Condition: Right toenails are normal.     Left foot:     Protective Sensation: 5 sites tested. 5 sites sensed.     Skin integrity: Skin integrity normal.     Toenail Condition: Left toenails are normal.  Skin:    General: Skin is warm.  Neurological:     General: No focal deficit present.     Mental Status: He is alert.  Psychiatric:        Mood and Affect: Mood normal.         Assessment And Plan:     1. Type 2 diabetes mellitus with stage 2 chronic kidney disease, without long-term current use of insulin ([place])  Diabetic foot exam was performed. I will check labs as listed below. He is encouraged to increase daily activity. He will rto in 4 months for re-evaluation.   - Lipid panel - CMP14+EGFR - Hemoglobin A1c  2. Hypertensive nephropathy  Well controlled. He will continue with current meds. He is encouraged to avoid adding salt to his foods.   3. Acute left-sided low back pain with sciatica  He declines muscle relaxer. He is encouraged to perform stretches daily. He is also encouraged to remain active. He will let me know if his sx persist.   4. Paresthesia of left lower extremity  Likely related to back pain. Since it is unilateral, less likely this is due to diabetic neuropathy. He will let me know if his sx persist.   5. Positive colorectal cancer screening using Cologuard test  He agrees to GI referral.   - Ambulatory referral to Gastroenterology      Maximino Greenland, MD    THE PATIENT IS ENCOURAGED TO PRACTICE SOCIAL DISTANCING DUE TO THE COVID-19 PANDEMIC.

## 2019-02-08 LAB — POCT URINALYSIS DIPSTICK
Bilirubin, UA: NEGATIVE
Blood, UA: NEGATIVE
Glucose, UA: NEGATIVE
Ketones, UA: NEGATIVE
Nitrite, UA: NEGATIVE
Protein, UA: NEGATIVE
Spec Grav, UA: 1.02 (ref 1.010–1.025)
Urobilinogen, UA: 0.2 E.U./dL
pH, UA: 5.5 (ref 5.0–8.0)

## 2019-02-08 LAB — POCT UA - MICROALBUMIN
Albumin/Creatinine Ratio, Urine, POC: 30
Creatinine, POC: 200 mg/dL
Microalbumin Ur, POC: 10 mg/L

## 2019-02-08 NOTE — Addendum Note (Signed)
Addended by: Luana Shu on: 02/08/2019 03:48 PM   Modules accepted: Orders

## 2019-02-10 ENCOUNTER — Telehealth: Payer: Self-pay

## 2019-02-10 NOTE — Telephone Encounter (Signed)
The pt said that he thinks that he needed to be tested for the coronavirus.  The pt was asked what symptoms he has and he said he has a fever running between 99 - 100 and the pt said that he took tylenol and that he fever was still at 99.  The pt was offered a virtual visit and he said that he has an appt tomorrow at an urgent care clinic.

## 2019-02-11 DIAGNOSIS — Z1159 Encounter for screening for other viral diseases: Secondary | ICD-10-CM | POA: Diagnosis not present

## 2019-02-11 DIAGNOSIS — R030 Elevated blood-pressure reading, without diagnosis of hypertension: Secondary | ICD-10-CM | POA: Diagnosis not present

## 2019-02-16 ENCOUNTER — Telehealth: Payer: Self-pay

## 2019-02-16 NOTE — Telephone Encounter (Signed)
I returned the pt's call and left him a message that no Dr/ Baird Cancer doesn't have his coronavirus results.

## 2019-02-18 ENCOUNTER — Other Ambulatory Visit: Payer: Self-pay

## 2019-02-18 NOTE — Telephone Encounter (Signed)
The pt called and said that he thinks that his lab results from [facility] that he had done at [place] that he has the coronavirus because it has on there coronavirus detected abnormal.  I told the pt to please call [facility] and ask them to give him his lab results, send a copy to Dr. Baird Cancer, and to call the office back to let the office know what [facility].

## 2019-03-29 ENCOUNTER — Telehealth: Payer: Self-pay

## 2019-03-29 NOTE — Telephone Encounter (Signed)
Pt notified to pick up samples Janumet [id number]

## 2019-05-11 ENCOUNTER — Telehealth: Payer: Self-pay | Admitting: Internal Medicine

## 2019-05-11 NOTE — Chronic Care Management (AMB) (Signed)
  Chronic Care Management   Note  05/11/2019 Name: Warren [name] MRN: [id number] DOB: [birth year]  [name] is a 73 y.o. year old male who is a primary care patient of Glendale Chard, MD. I reached out to [name] by phone today in response to a referral sent by [name] health plan.     [name] including:  1. CCM service includes personalized support from designated clinical staff supervised by his physician, including individualized plan of care and coordination with other care providers 2. 24/7 contact phone numbers for assistance for urgent and routine care needs. 3. Service will only be billed when office clinical staff spend 20 minutes or more in a month to coordinate care. 4. Only one practitioner may furnish and bill the service in a calendar month. 5. The patient may stop CCM services at any time (effective at the end of the month) by phone call to the office staff. 6. The patient will be responsible for cost sharing (co-pay) of up to 20% of the service fee (after annual deductible is met).  Patient did not agree to services and wishes to consider information provided before deciding about enrollment in care management services.   Follow up plan: The care management team is available to follow up with the patient after provider conversation with the patient regarding recommendation for care management engagement and subsequent re-referral to the care management team.   [place] . [employer]  [email or web address]  ??[phone]

## 2019-05-30 ENCOUNTER — Other Ambulatory Visit: Payer: Self-pay | Admitting: Internal Medicine

## 2019-06-01 ENCOUNTER — Ambulatory Visit: Payer: Medicare Other

## 2019-06-01 ENCOUNTER — Ambulatory Visit: Payer: Medicare Other | Admitting: Internal Medicine

## 2019-06-14 ENCOUNTER — Ambulatory Visit: Payer: Medicare Other

## 2019-06-15 ENCOUNTER — Telehealth: Payer: Self-pay | Admitting: Internal Medicine

## 2019-06-15 NOTE — Telephone Encounter (Signed)
I called the patient to reschedule his AWV with [place].  He said that he will call back to schedule it.

## 2019-06-30 ENCOUNTER — Telehealth: Payer: Self-pay | Admitting: Internal Medicine

## 2019-06-30 NOTE — Telephone Encounter (Signed)
Left msg asking pt to call and reschedule AWV

## 2019-07-12 ENCOUNTER — Ambulatory Visit (INDEPENDENT_AMBULATORY_CARE_PROVIDER_SITE_OTHER): Payer: Medicare Other

## 2019-07-12 ENCOUNTER — Other Ambulatory Visit: Payer: Self-pay

## 2019-07-12 VITALS — BP 126/82 | HR 70 | Temp 97.8°F | Ht 68.0 in | Wt 209.8 lb

## 2019-07-12 DIAGNOSIS — Z0000 Encounter for general adult medical examination without abnormal findings: Secondary | ICD-10-CM | POA: Diagnosis not present

## 2019-07-12 NOTE — Progress Notes (Signed)
This visit occurred during the SARS-CoV-2 public health emergency.  Safety protocols were in place, including screening questions prior to the visit, additional usage of staff PPE, and extensive cleaning of exam room while observing appropriate contact time as indicated for disinfecting solutions.  Subjective:   Warren [name] is a 73 y.o. male who presents for Medicare Annual/Subsequent preventive examination.  Review of Systems:  n/a Cardiac Risk Factors include: advanced age (>54mn, >>44women);male gender;diabetes mellitus;hypertension;sedentary lifestyle;obesity (BMI >30kg/m2)     Objective:    Vitals: BP 126/82 (BP Location: Left Arm, Patient Position: Sitting, Cuff Size: Normal)   Pulse 70   Temp 97.8 F (36.6 C) (Oral)   Ht '5\' 8"'$  (1.727 m)   Wt 209 lb 12.8 oz (95.2 kg)   SpO2 97%   BMI 31.90 kg/m   Body mass index is 31.9 kg/m.  Advanced Directives 07/12/2019 05/26/2018  Does Patient Have a Medical Advance Directive? No No  Would patient like information on creating a medical advance directive? No - Patient declined Yes (MAU/Ambulatory/Procedural Areas - Information given)    Tobacco Social History   Tobacco Use  Smoking Status Never Smoker  Smokeless Tobacco Never Used     Counseling given: Not Answered   Clinical Intake:  Pre-visit preparation completed: Yes  Pain : No/denies pain     Nutritional Status: BMI > 30  Obese Nutritional Risks: None Diabetes: Yes CBG done?: No Did pt. bring in CBG monitor from home?: No  How often do you need to have someone help you when you read instructions, pamphlets, or other written materials from your doctor or pharmacy?: 1 - Never What is the last grade level you completed in school?: 2 years college  Interpreter Needed?: No  Information entered by :: NAllen LPN  Past Medical History:  Diagnosis Date  . Chronic kidney disease    stage 2  . [facility]   . Hypertension   .  Malaise and fatigue    Past Surgical History:  Procedure Laterality Date  . surgical repair     metatarsal repair.    Family History  Problem Relation Age of Onset  . Hypertension Mother   . Hypothyroidism Mother   . Alzheimer's disease Father   . Prostate cancer Father   . Prostate cancer Brother    Social History   Socioeconomic History  . Marital status: Single    Spouse name: Not on file  . Number of children: Not on file  . Years of education: Not on file  . Highest education level: Not on file  Occupational History  . Occupation: retired  SScientific laboratory technician . Financial resource strain: Not hard at all  . Food insecurity    Worry: Never true    Inability: Never true  . Transportation needs    Medical: No    Non-medical: No  Tobacco Use  . Smoking status: Never Smoker  . Smokeless tobacco: Never Used  Substance and Sexual Activity  . Alcohol use: Yes    Alcohol/week: 3.0 standard drinks    Types: 3 Cans of beer per week    Comment: daily  . Drug use: Yes    Types: Marijuana  . Sexual activity: Yes  Lifestyle  . Physical activity    Days per week: 0 days    Minutes per session: 0 min  . Stress: Not at all  Relationships  . Social connections    Talks on phone: Not on file  Gets together: Not on file    Attends religious service: Not on file    Active member of club or organization: Not on file    Attends meetings of clubs or organizations: Not on file    Relationship status: Not on file  Other Topics Concern  . Not on file  Social History Narrative  . Not on file    Outpatient Encounter Medications as of 07/12/2019  Medication Sig  . Magnesium 250 MG TABS Take 1 tablet by mouth daily.  . sildenafil (REVATIO) 20 MG tablet Take 1 tablet (20 mg total) by mouth as needed. MAX 3 TO 5 TABLETS PER DAY  . sitaGLIPtin-metformin (JANUMET) 50-1000 MG tablet Take 1 tablet by mouth daily.  Marland Kitchen amLODipine (NORVASC) 10 MG tablet Take 1 tablet (10 mg total) by mouth  daily. (Patient not taking: Reported on 07/12/2019)  . atorvastatin (LIPITOR) 20 MG tablet Take 1 tablet (20 mg total) by mouth daily. (Patient not taking: Reported on 07/12/2019)  . Blood Glucose Monitoring Suppl (ONE TOUCH ULTRA 2) w/Device KIT Use as directed to check blood sugars 1 time per day dx: e11.65 (Patient not taking: Reported on 07/12/2019)  . glucose blood (ONE TOUCH ULTRA TEST) test strip Use as instructed to check blood sugars 1 time per day dx: e11.65 (Patient not taking: Reported on 02/03/2019)  . telmisartan-hydrochlorothiazide (MICARDIS HCT) 80-12.5 MG tablet Take 1 tablet by mouth daily.   No facility-administered encounter medications on file as of 07/12/2019.     Activities of Daily Living In your present state of health, do you have any difficulty performing the following activities: 07/12/2019  Hearing? N  Vision? N  Difficulty concentrating or making decisions? N  Walking or climbing stairs? N  Dressing or bathing? N  Doing errands, shopping? N  Preparing Food and eating ? N  Using the Toilet? N  In the past six months, have you accidently leaked urine? N  Do you have problems with loss of bowel control? N  Managing your Medications? N  Managing your Finances? N  Housekeeping or managing your Housekeeping? N  Some recent data might be hidden    Patient Care Team: Glendale Chard, MD as PCP - General (Internal Medicine)   Assessment:   This is a routine wellness examination for [name].  Exercise Activities and Dietary recommendations Current Exercise Habits: The patient does not participate in regular exercise at present  Goals    . DIET - INCREASE WATER INTAKE (pt-stated)     Patient would like to increase amount of water daily.    Marland Kitchen DIET - INCREASE WATER INTAKE     07/12/2019, still working on increasing water. Up to about 4 bottles       Fall Risk Fall Risk  07/12/2019 02/03/2019 10/13/2018 07/06/2018 07/01/2018  Falls in the past year? 0 0 0 0 0   Comment - - - - Emmi Telephone Survey: data to providers prior to load  Risk for fall due to : Medication side effect - - - -  Follow up Falls evaluation completed;Education provided;Falls prevention discussed - - - -   Is the patient's home free of loose throw rugs in walkways, pet beds, electrical cords, etc?   yes      Grab bars in the bathroom? no      Handrails on the stairs?   n/a      Adequate lighting?   yes  Timed Get Up and Go Performed: n/a  Depression Screen [facility] 2/9  Scores 07/12/2019 02/03/2019 10/13/2018 07/06/2018  PHQ - 2 Score 0 0 0 0  PHQ- 9 Score 0 - - -    Cognitive Function     6CIT Screen 07/12/2019 05/26/2018  What Year? 0 points 0 points  What month? 0 points 0 points  What time? 0 points 0 points  Count back from 20 0 points 0 points  Months in reverse 0 points 0 points  Repeat phrase 2 points 0 points  Total Score 2 0    Immunization History  Administered Date(s) Administered  . Tdap 07/19/2013    Qualifies for Shingles Vaccine? yes  Screening Tests Health Maintenance  Topic Date Due  . OPHTHALMOLOGY EXAM  07/12/2019 (Originally 04/09/2019)  . INFLUENZA VACCINE  11/09/2019 (Originally 03/12/2019)  . COLONOSCOPY  07/11/2020 (Originally 04/14/1998)  . PNA vac Low Risk Adult (1 of 2 - PCV13) 07/11/2020 (Originally 04/14/2013)  . HEMOGLOBIN A1C  08/05/2019  . FOOT EXAM  02/03/2020  . TETANUS/TDAP  07/20/2023  . Hepatitis C Screening  Completed   Cancer Screenings: Lung: Low Dose CT Chest recommended if Age 96-80 years, 30 pack-year currently smoking OR have quit w/in 15years. Patient does not qualify. Colorectal: decline  Additional Screenings:  Hepatitis C Screening:06/30/2018      Plan:    patient working on increasing water.   I have personally reviewed and noted the following in the patient's chart:   . Medical and social history . Use of alcohol, tobacco or illicit drugs  . Current medications and supplements . Functional ability and  status . Nutritional status . Physical activity . Advanced directives . List of other physicians . Hospitalizations, surgeries, and ER visits in previous 12 months . Vitals . Screenings to include cognitive, depression, and falls . Referrals and appointments  In addition, I have reviewed and discussed with patient certain preventive protocols, quality metrics, and best practice recommendations. A written personalized care plan for preventive services as well as general preventive health recommendations were provided to patient.     Kellie Simmering, LPN  31/12/4006

## 2019-07-12 NOTE — Patient Instructions (Signed)
Warren [name] , Thank you for taking time to come for your Medicare Wellness Visit. I appreciate your ongoing commitment to your health goals. Please review the following plan we discussed and let me know if I can assist you in the future.   Screening recommendations/referrals: Colonoscopy: declines Recommended yearly ophthalmology/optometry visit for glaucoma screening and checkup Recommended yearly dental visit for hygiene and checkup  Vaccinations: Influenza vaccine: declines Pneumococcal vaccine: declines Tdap vaccine: 07/2013 Shingles vaccine: declines    Advanced directives: Advance directive discussed with you today. Even though you declined this today please call our office should you change your mind and we can give you the proper paperwork for you to fill out.   Conditions/risks identified: obesity  Next appointment: 09/19/2019 at 9:45  Preventive Care 65 Years and Older, Male Preventive care refers to lifestyle choices and visits with your health care provider that can promote health and wellness. What does preventive care include?  A yearly physical exam. This is also called an annual well check.  Dental exams once or twice a year.  Routine eye exams. Ask your health care provider how often you should have your eyes checked.  Personal lifestyle choices, including:  Daily care of your teeth and gums.  Regular physical activity.  Eating a healthy diet.  Avoiding tobacco and drug use.  Limiting alcohol use.  Practicing safe sex.  Taking low doses of aspirin every day.  Taking vitamin and mineral supplements as recommended by your health care provider. What happens during an annual well check? The services and screenings done by your health care provider during your annual well check will depend on your age, overall health, lifestyle risk factors, and family history of disease. Counseling  Your health care provider may ask you questions about your:  Alcohol  use.  Tobacco use.  Drug use.  Emotional well-being.  Home and relationship well-being.  Sexual activity.  Eating habits.  History of falls.  Memory and ability to understand (cognition).  Work and work Statistician. Screening  You may have the following tests or measurements:  Height, weight, and BMI.  Blood pressure.  Lipid and cholesterol levels. These may be checked every 5 years, or more frequently if you are over 37 years old.  Skin check.  Lung cancer screening. You may have this screening every year starting at age 82 if you have a 30-pack-year history of smoking and currently smoke or have quit within the past 15 years.  Fecal occult blood test (FOBT) of the stool. You may have this test every year starting at age 69.  Flexible sigmoidoscopy or colonoscopy. You may have a sigmoidoscopy every 5 years or a colonoscopy every 10 years starting at age 76.  Prostate cancer screening. Recommendations will vary depending on your family history and other risks.  Hepatitis C blood test.  Hepatitis B blood test.  Sexually transmitted disease (STD) testing.  Diabetes screening. This is done by checking your blood sugar (glucose) after you have not eaten for a while (fasting). You may have this done every 1-3 years.  Abdominal aortic aneurysm (AAA) screening. You may need this if you are a current or former smoker.  Osteoporosis. You may be screened starting at age 32 if you are at high risk. Talk with your health care provider about your test results, treatment options, and if necessary, the need for more tests. Vaccines  Your health care provider may recommend certain vaccines, such as:  Influenza vaccine. This is recommended every year.  Tetanus, diphtheria, and acellular pertussis (Tdap, Td) vaccine. You may need a Td booster every 10 years.  Zoster vaccine. You may need this after age 31.  Pneumococcal 13-valent conjugate (PCV13) vaccine. One dose is  recommended after age 15.  Pneumococcal polysaccharide (PPSV23) vaccine. One dose is recommended after age 79. Talk to your health care provider about which screenings and vaccines you need and how often you need them. This information is not intended to replace advice given to you by your health care provider. Make sure you discuss any questions you have with your health care provider. Document Released: 08/24/2015 Document Revised: 04/16/2016 Document Reviewed: 05/29/2015 Elsevier Interactive Patient Education  2017 [place] Prevention in the Home Falls can cause injuries. They can happen to people of all ages. There are many things you can do to make your home safe and to help prevent falls. What can I do on the outside of my home?  Regularly fix the edges of walkways and driveways and fix any cracks.  Remove anything that might make you trip as you walk through a door, such as a raised step or threshold.  Trim any bushes or trees on the path to your home.  Use bright outdoor lighting.  Clear any walking paths of anything that might make someone trip, such as rocks or tools.  Regularly check to see if handrails are loose or broken. Make sure that both sides of any steps have handrails.  Any raised decks and porches should have guardrails on the edges.  Have any leaves, snow, or ice cleared regularly.  Use sand or salt on walking paths during winter.  Clean up any spills in your garage right away. This includes oil or grease spills. What can I do in the bathroom?  Use night lights.  Install grab bars by the toilet and in the tub and shower. Do not use towel bars as grab bars.  Use non-skid mats or decals in the tub or shower.  If you need to sit down in the shower, use a plastic, non-slip stool.  Keep the floor dry. Clean up any water that spills on the floor as soon as it happens.  Remove soap buildup in the tub or shower regularly.  Attach bath mats  securely with double-sided non-slip rug tape.  Do not have throw rugs and other things on the floor that can make you trip. What can I do in the bedroom?  Use night lights.  Make sure that you have a light by your bed that is easy to reach.  Do not use any sheets or blankets that are too big for your bed. They should not hang down onto the floor.  Have a firm chair that has side arms. You can use this for support while you get dressed.  Do not have throw rugs and other things on the floor that can make you trip. What can I do in the kitchen?  Clean up any spills right away.  Avoid walking on wet floors.  Keep items that you use a lot in easy-to-reach places.  If you need to reach something above you, use a strong step stool that has a grab bar.  Keep electrical cords out of the way.  Do not use floor polish or wax that makes floors slippery. If you must use wax, use non-skid floor wax.  Do not have throw rugs and other things on the floor that can make you trip. What can I do  with my stairs?  Do not leave any items on the stairs.  Make sure that there are handrails on both sides of the stairs and use them. Fix handrails that are broken or loose. Make sure that handrails are as long as the stairways.  Check any carpeting to make sure that it is firmly attached to the stairs. Fix any carpet that is loose or worn.  Avoid having throw rugs at the top or bottom of the stairs. If you do have throw rugs, attach them to the floor with carpet tape.  Make sure that you have a light switch at the top of the stairs and the bottom of the stairs. If you do not have them, ask someone to add them for you. What else can I do to help prevent falls?  Wear shoes that:  Do not have high heels.  Have rubber bottoms.  Are comfortable and fit you well.  Are closed at the toe. Do not wear sandals.  If you use a stepladder:  Make sure that it is fully opened. Do not climb a closed  stepladder.  Make sure that both sides of the stepladder are locked into place.  Ask someone to hold it for you, if possible.  Clearly mark and make sure that you can see:  Any grab bars or handrails.  First and last steps.  Where the edge of each step is.  Use tools that help you move around (mobility aids) if they are needed. These include:  Canes.  Walkers.  Scooters.  Crutches.  Turn on the lights when you go into a dark area. Replace any light bulbs as soon as they burn out.  Set up your furniture so you have a clear path. Avoid moving your furniture around.  If any of your floors are uneven, fix them.  If there are any pets around you, be aware of where they are.  Review your medicines with your doctor. Some medicines can make you feel dizzy. This can increase your chance of falling. Ask your doctor what other things that you can do to help prevent falls. This information is not intended to replace advice given to you by your health care provider. Make sure you discuss any questions you have with your health care provider. Document Released: 05/24/2009 Document Revised: 01/03/2016 Document Reviewed: 09/01/2014 Elsevier Interactive Patient Education  2017 [employer].

## 2019-07-22 ENCOUNTER — Telehealth: Payer: Self-pay

## 2019-07-22 NOTE — Telephone Encounter (Signed)
The pt was notified. 

## 2019-07-22 NOTE — Telephone Encounter (Signed)
-----   Message from Glendale Chard, MD sent at 07/21/2019  5:55 PM EST ----- I have not sent any letter to his insurance company. They already know he has diabetes with chronic kidney disease.   RS ----- Message ----- From: Michelle Nasuti, [place] Sent: 07/21/2019   5:23 PM EST To: Glendale Chard, MD  The pt said that he changed the plan on his united health care supplement and they said that Dr. Baird Cancer sent a letter that he is diabetic and has acute kidney disease.  The insurance company gave him a form that needed to be completed. The pt was told drop the form off on Monday so that Dr. Baird Cancer can review the form.

## 2019-08-03 ENCOUNTER — Telehealth: Payer: Self-pay

## 2019-08-03 NOTE — Telephone Encounter (Signed)
The pt was notified that Dr Baird Cancer said the pt's diagnosis of CKD is not new and has been discussed at previous visits and this is why he has been advised to increase his water intake on previous occasions and he can make an appt to discuss further.

## 2019-09-19 ENCOUNTER — Ambulatory Visit: Payer: Medicare Other | Admitting: Internal Medicine

## 2019-12-01 ENCOUNTER — Ambulatory Visit: Payer: Medicare Other | Attending: Internal Medicine

## 2019-12-01 DIAGNOSIS — Z23 Encounter for immunization: Secondary | ICD-10-CM

## 2019-12-01 NOTE — Progress Notes (Signed)
   Covid-19 Vaccination Clinic  Name:  Warren [name]    MRN: [id number] DOB: [birth year]  12/01/2019  [name] was observed post Covid-19 immunization for 15 minutes without incident. He was provided with Vaccine Information Sheet and instruction to access the V-Safe system.   [name] was instructed to call 911 with any severe reactions post vaccine: Marland Kitchen Difficulty breathing  . Swelling of face and throat  . A fast heartbeat  . A bad rash all over body  . Dizziness and weakness   Immunizations Administered    Name Date Dose VIS Date Route   Pfizer COVID-19 Vaccine 12/01/2019  8:29 AM 0.3 mL 10/05/2018 Intramuscular   Manufacturer: [employer], [employer]   Lot: [id number]   NDC: [address]-3

## 2019-12-05 ENCOUNTER — Other Ambulatory Visit: Payer: Self-pay | Admitting: Internal Medicine

## 2019-12-26 ENCOUNTER — Ambulatory Visit: Payer: Medicare Other | Attending: Internal Medicine

## 2019-12-26 DIAGNOSIS — Z23 Encounter for immunization: Secondary | ICD-10-CM

## 2019-12-26 NOTE — Progress Notes (Signed)
   Covid-19 Vaccination Clinic  Name:  Warren [name]    MRN: [id number] DOB: 07-30-1946  12/26/2019  [name] was observed post Covid-19 immunization for 15 minutes without incident. He was provided with Vaccine Information Sheet and instruction to access the V-Safe system.   [name] was instructed to call 911 with any severe reactions post vaccine: Marland Kitchen Difficulty breathing  . Swelling of face and throat  . A fast heartbeat  . A bad rash all over body  . Dizziness and weakness   Immunizations Administered    Name Date Dose VIS Date Route   Pfizer COVID-19 Vaccine 12/26/2019  8:13 AM 0.3 mL 10/05/2018 Intramuscular   Manufacturer: [employer], [employer]   Lot: [id number]   NDC: [address]-7

## 2020-03-05 ENCOUNTER — Other Ambulatory Visit: Payer: Self-pay | Admitting: Internal Medicine

## 2020-03-05 NOTE — Telephone Encounter (Signed)
Sildenafil refill.  Made an appt for 03/26/20. Asked pt about htn dm medications he said that he dont do those anymore he took a natural route.

## 2020-03-26 ENCOUNTER — Ambulatory Visit (INDEPENDENT_AMBULATORY_CARE_PROVIDER_SITE_OTHER): Payer: Medicare Other | Admitting: Internal Medicine

## 2020-03-26 ENCOUNTER — Other Ambulatory Visit: Payer: Self-pay

## 2020-03-26 ENCOUNTER — Encounter: Payer: Self-pay | Admitting: Internal Medicine

## 2020-03-26 VITALS — BP 154/90 | HR 70 | Temp 98.2°F | Ht 68.0 in | Wt 203.0 lb

## 2020-03-26 DIAGNOSIS — I129 Hypertensive chronic kidney disease with stage 1 through stage 4 chronic kidney disease, or unspecified chronic kidney disease: Secondary | ICD-10-CM | POA: Diagnosis not present

## 2020-03-26 DIAGNOSIS — E1122 Type 2 diabetes mellitus with diabetic chronic kidney disease: Secondary | ICD-10-CM

## 2020-03-26 DIAGNOSIS — N182 Chronic kidney disease, stage 2 (mild): Secondary | ICD-10-CM

## 2020-03-26 DIAGNOSIS — N529 Male erectile dysfunction, unspecified: Secondary | ICD-10-CM

## 2020-03-26 DIAGNOSIS — Z6830 Body mass index (BMI) 30.0-30.9, adult: Secondary | ICD-10-CM | POA: Diagnosis not present

## 2020-03-26 DIAGNOSIS — E6609 Other obesity due to excess calories: Secondary | ICD-10-CM

## 2020-03-26 LAB — POCT UA - MICROALBUMIN
Albumin/Creatinine Ratio, Urine, POC: <30
Creatinine, POC: 100 mg/dL
Microalbumin Ur, POC: 30 mg/L

## 2020-03-26 MED ORDER — SILDENAFIL CITRATE 20 MG PO TABS: ORAL_TABLET | ORAL | 2 refills | Status: DC

## 2020-03-26 NOTE — Patient Instructions (Signed)
Exercising to Stay Healthy To become healthy and stay healthy, it is recommended that you do moderate-intensity and vigorous-intensity exercise. You can tell that you are exercising at a moderate intensity if your heart starts beating faster and you start breathing faster but can still hold a conversation. You can tell that you are exercising at a vigorous intensity if you are breathing much harder and faster and cannot hold a conversation while exercising. Exercising regularly is important. It has many health benefits, such as:  Improving overall fitness, flexibility, and endurance.  Increasing bone density.  Helping with weight control.  Decreasing body fat.  Increasing muscle strength.  Reducing stress and tension.  Improving overall health. How often should I exercise? Choose an activity that you enjoy, and set realistic goals. Your health care provider can help you make an activity plan that works for you. Exercise regularly as told by your health care provider. This may include:  Doing strength training two times a week, such as: ? Lifting weights. ? Using resistance bands. ? Push-ups. ? Sit-ups. ? Yoga.  Doing a certain intensity of exercise for a given amount of time. Choose from these options: ? A total of 150 minutes of moderate-intensity exercise every week. ? A total of 75 minutes of vigorous-intensity exercise every week. ? A mix of moderate-intensity and vigorous-intensity exercise every week. Children, pregnant women, people who have not exercised regularly, people who are overweight, and older adults may need to talk with a health care provider about what activities are safe to do. If you have a medical condition, be sure to talk with your health care provider before you start a new exercise program. What are some exercise ideas? Moderate-intensity exercise ideas include:  Walking 1 mile (1.6 km) in about 15  minutes.  Biking.  Hiking.  Golfing.  Dancing.  Water aerobics. Vigorous-intensity exercise ideas include:  Walking 4.5 miles (7.2 km) or more in about 1 hour.  Jogging or running 5 miles (8 km) in about 1 hour.  Biking 10 miles (16.1 km) or more in about 1 hour.  Lap swimming.  Roller-skating or in-line skating.  Cross-country skiing.  Vigorous competitive sports, such as football, basketball, and soccer.  Jumping rope.  Aerobic dancing. What are some everyday activities that can help me to get exercise?  Yard work, such as: ? Pushing a lawn mower. ? Raking and bagging leaves.  Washing your car.  Pushing a stroller.  Shoveling snow.  Gardening.  Washing windows or floors. How can I be more active in my day-to-day activities?  Use stairs instead of an elevator.  Take a walk during your lunch break.  If you drive, park your car farther away from your work or school.  If you take public transportation, get off one stop early and walk the rest of the way.  Stand up or walk around during all of your indoor phone calls.  Get up, stretch, and walk around every 30 minutes throughout the day.  Enjoy exercise with a friend. Support to continue exercising will help you keep a regular routine of activity. What guidelines can I follow while exercising?  Before you start a new exercise program, talk with your health care provider.  Do not exercise so much that you hurt yourself, feel dizzy, or get very short of breath.  Wear comfortable clothes and wear shoes with good support.  Drink plenty of water while you exercise to prevent dehydration or heat stroke.  Work out until your breathing   and your heartbeat get faster. Where to find more information  [place] Department of Health and Human Services: www.hhs.gov  Centers for Disease Control and Prevention (CDC): www.cdc.gov Summary  Exercising regularly is important. It will improve your overall fitness,  flexibility, and endurance.  Regular exercise also will improve your overall health. It can help you control your weight, reduce stress, and improve your bone density.  Do not exercise so much that you hurt yourself, feel dizzy, or get very short of breath.  Before you start a new exercise program, talk with your health care provider. This information is not intended to replace advice given to you by your health care provider. Make sure you discuss any questions you have with your health care provider. Document Revised: 07/10/2017 Document Reviewed: 06/18/2017 Elsevier Patient Education  2020 Elsevier Inc.  

## 2020-03-26 NOTE — Progress Notes (Signed)
I,[name],acting as a Education administrator for Maximino Greenland, MD.,have documented all relevant documentation on the behalf of Maximino Greenland, MD,as directed by  Maximino Greenland, MD while in the presence of Maximino Greenland, MD.  This visit occurred during the SARS-CoV-2 public health emergency.  Safety protocols were in place, including screening questions prior to the visit, additional usage of staff PPE, and extensive cleaning of exam room while observing appropriate contact time as indicated for disinfecting solutions.  Subjective:     Patient ID: Warren [name] , male    DOB: 06-Mar-1946 , 74 y.o.   MRN: [id number]   Chief Complaint  Patient presents with  . Diabetes  . Hypertension    HPI  The patient is here today for a follow-up on his diabetes and blood pressure.  The patient hasn't been compliant with his medications.  He states that he doesn't need them.  Diabetes He presents for his follow-up diabetic visit. He has type 2 diabetes mellitus. There are no hypoglycemic associated symptoms. Pertinent negatives for diabetes include no blurred vision and no chest pain. There are no hypoglycemic complications. Diabetic complications include nephropathy. Risk factors for coronary artery disease include diabetes mellitus, dyslipidemia, hypertension, male sex and sedentary lifestyle. He never participates in exercise. An ACE inhibitor/angiotensin II receptor blocker is being taken. He does not see a podiatrist.Eye exam is current.  Hypertension This is a chronic problem. The current episode started more than 1 year ago. The problem has been gradually improving since onset. The problem is controlled. Pertinent negatives include no blurred vision, chest pain, palpitations or shortness of breath. Risk factors for coronary artery disease include diabetes mellitus, dyslipidemia, male gender and sedentary lifestyle. Past treatments include angiotensin blockers and diuretics. The current treatment provides  moderate improvement. Compliance problems include exercise.  Hypertensive end-organ damage includes kidney disease.     Past Medical History:  Diagnosis Date  . Chronic kidney disease    stage 2  . Diabetes mellitus without complication ([place])   . Hypertension   . Malaise and fatigue      Family History  Problem Relation Age of Onset  . Hypertension Mother   . Hypothyroidism Mother   . Alzheimer's disease Father   . Prostate cancer Father   . Prostate cancer Brother      Current Outpatient Medications:  .  sildenafil (REVATIO) 20 MG tablet, Take 1 tablet (20 mg total) by mouth as needed. MAX 3 TO 5 TABLETS PER DAY, Disp: 60 tablet, Rfl: 2 .  amLODipine (NORVASC) 10 MG tablet, Take 1 tablet (10 mg total) by mouth daily. (Patient not taking: Reported on 07/12/2019), Disp: 90 tablet, Rfl: 2 .  atorvastatin (LIPITOR) 20 MG tablet, Take 1 tablet (20 mg total) by mouth daily. (Patient not taking: Reported on 07/12/2019), Disp: 90 tablet, Rfl: 2 .  Blood Glucose Monitoring Suppl (ONE TOUCH ULTRA 2) w/Device KIT, Use as directed to check blood sugars 1 time per day dx: e11.65 (Patient not taking: Reported on 07/12/2019), Disp: 1 each, Rfl: 1 .  glucose blood (ONE TOUCH ULTRA TEST) test strip, Use as instructed to check blood sugars 1 time per day dx: e11.65 (Patient not taking: Reported on 02/03/2019), Disp: 50 each, Rfl: 11 .  Magnesium 250 MG TABS, Take 1 tablet by mouth daily. (Patient not taking: Reported on 03/26/2020), Disp: , Rfl:  .  sitaGLIPtin-metformin (JANUMET) 50-1000 MG tablet, Take 1 tablet by mouth daily. (Patient not taking: Reported on 03/26/2020), Disp: ,  Rfl:  .  telmisartan-hydrochlorothiazide (MICARDIS HCT) 80-12.5 MG tablet, Take 1 tablet by mouth daily. (Patient not taking: Reported on 03/26/2020), Disp: , Rfl:    No Known Allergies   Review of Systems  Constitutional: Negative.   Eyes: Negative for blurred vision.  Respiratory: Negative.  Negative for shortness of  breath.   Cardiovascular: Negative.  Negative for chest pain and palpitations.  Gastrointestinal: Negative.   Genitourinary:       He would like refill of sildenafil. He gets this filled at [facility].   Psychiatric/Behavioral: Negative.   All other systems reviewed and are negative.    Today's Vitals   03/26/20 1126  BP: (!) 154/90  Pulse: 70  Temp: 98.2 F (36.8 C)  TempSrc: Oral  Weight: 203 lb (92.1 kg)  Height: '5\' 8"'$  (1.727 m)  PainSc: 0-No pain   Body mass index is 30.87 kg/m.  Wt Readings from Last 3 Encounters:  03/26/20 203 lb (92.1 kg)  07/12/19 209 lb 12.8 oz (95.2 kg)  02/03/19 206 lb (93.4 kg)   Objective:  Physical Exam Vitals and nursing note reviewed.  Constitutional:      Appearance: Normal appearance. He is obese.  HENT:     Head: Normocephalic and atraumatic.  Cardiovascular:     Rate and Rhythm: Normal rate and regular rhythm.     Heart sounds: Normal heart sounds.  Pulmonary:     Breath sounds: Normal breath sounds.  Skin:    General: Skin is warm.  Neurological:     General: No focal deficit present.     Mental Status: He is alert and oriented to person, place, and time.         Assessment And Plan:     1. Type 2 diabetes mellitus with stage 2 chronic kidney disease, without long-term current use of insulin (HCC) Comments: Chronic, I will check labs as listed below. He does not wish to take meds at this time. At end of visit, he decided to not get labs drawn. These were cancelled.  - CMP14+EGFR - CBC - Lipid panel - Hemoglobin A1c - POCT UA - Microalbumin  2. Hypertensive nephropathy Comments: Chronic, uncontrolled. He does not wish to take any meds at this time. He is encouraged to avoid adding salt to his foods.WE also discussed various stages of CKD. He is encouraged to stay well hydrated.   3. Erectile dysfunction, unspecified erectile dysfunction type Comments: Chronic, likely due to longstanding htn/dm. He was given refill  of sildenafil as requested.   4. Class 1 obesity due to excess calories with serious comorbidity and body mass index (BMI) of 30.0 to 30.9 in adult His BMI is acceptable for his demographic. He is advised to exercise no less than 150 minutes per week.    At end of visit, he determined he did not want to have labs drawn. Therefore, they will be cancelled.  Patient was given opportunity to ask questions. Patient verbalized understanding of the plan and was able to repeat key elements of the plan. All questions were answered to their satisfaction.  Maximino Greenland, MD   I, Maximino Greenland, MD, have reviewed all documentation for this visit. The documentation on 03/26/20 for the exam, diagnosis, procedures, and orders are all accurate and complete.  THE PATIENT IS ENCOURAGED TO PRACTICE SOCIAL DISTANCING DUE TO THE COVID-19 PANDEMIC.

## 2020-05-26 ENCOUNTER — Ambulatory Visit: Payer: Medicare Other | Attending: Internal Medicine

## 2020-06-28 ENCOUNTER — Ambulatory Visit: Payer: Medicare Other

## 2020-06-29 ENCOUNTER — Ambulatory Visit: Payer: Medicare Other | Attending: Internal Medicine

## 2020-06-29 DIAGNOSIS — Z23 Encounter for immunization: Secondary | ICD-10-CM

## 2020-06-29 NOTE — Progress Notes (Signed)
   Covid-19 Vaccination Clinic  Name:  Dequavion Follette    MRN: [id number] DOB: 1946-02-18  06/29/2020  Mr. Makin was observed post Covid-19 immunization for 15 minutes without incident. He was provided with Vaccine Information Sheet and instruction to access the V-Safe system.   Mr. Mandato was instructed to call 911 with any severe reactions post vaccine: Marland Kitchen Difficulty breathing  . Swelling of face and throat  . A fast heartbeat  . A bad rash all over body  . Dizziness and weakness   Immunizations Administered    Name Date Dose VIS Date Route   Pfizer COVID-19 Vaccine 06/29/2020  1:15 PM 0.3 mL 05/30/2020 Intramuscular   Manufacturer: [employer], [employer]   Lot: [id number]   NDC: [address]-1

## 2020-07-18 ENCOUNTER — Ambulatory Visit: Payer: Medicare Other | Admitting: Internal Medicine

## 2020-09-26 ENCOUNTER — Ambulatory Visit: Payer: Medicare Other | Admitting: Internal Medicine

## 2020-11-27 ENCOUNTER — Other Ambulatory Visit: Payer: Self-pay | Admitting: Internal Medicine

## 2020-12-13 ENCOUNTER — Other Ambulatory Visit: Payer: Self-pay

## 2020-12-13 ENCOUNTER — Ambulatory Visit (INDEPENDENT_AMBULATORY_CARE_PROVIDER_SITE_OTHER): Payer: Medicare HMO

## 2020-12-13 ENCOUNTER — Ambulatory Visit (INDEPENDENT_AMBULATORY_CARE_PROVIDER_SITE_OTHER): Payer: Medicare HMO | Admitting: Internal Medicine

## 2020-12-13 ENCOUNTER — Encounter: Payer: Self-pay | Admitting: Internal Medicine

## 2020-12-13 VITALS — BP 136/82 | HR 67 | Temp 98.1°F | Ht 68.0 in | Wt 211.6 lb

## 2020-12-13 VITALS — BP 136/82 | HR 67 | Temp 98.1°F | Ht 68.0 in | Wt 211.0 lb

## 2020-12-13 DIAGNOSIS — E6609 Other obesity due to excess calories: Secondary | ICD-10-CM

## 2020-12-13 DIAGNOSIS — N182 Chronic kidney disease, stage 2 (mild): Secondary | ICD-10-CM | POA: Diagnosis not present

## 2020-12-13 DIAGNOSIS — N5201 Erectile dysfunction due to arterial insufficiency: Secondary | ICD-10-CM

## 2020-12-13 DIAGNOSIS — Z6832 Body mass index (BMI) 32.0-32.9, adult: Secondary | ICD-10-CM | POA: Diagnosis not present

## 2020-12-13 DIAGNOSIS — Z0000 Encounter for general adult medical examination without abnormal findings: Secondary | ICD-10-CM | POA: Diagnosis not present

## 2020-12-13 DIAGNOSIS — E1122 Type 2 diabetes mellitus with diabetic chronic kidney disease: Secondary | ICD-10-CM

## 2020-12-13 DIAGNOSIS — I129 Hypertensive chronic kidney disease with stage 1 through stage 4 chronic kidney disease, or unspecified chronic kidney disease: Secondary | ICD-10-CM

## 2020-12-13 DIAGNOSIS — R351 Nocturia: Secondary | ICD-10-CM

## 2020-12-13 NOTE — Progress Notes (Signed)
I,[name],acting as a Education administrator for Maximino Greenland, MD.,have documented all relevant documentation on the behalf of Maximino Greenland, MD,as directed by  Maximino Greenland, MD while in the presence of Maximino Greenland, MD. This visit occurred during the SARS-CoV-2 public health emergency.  Safety protocols were in place, including screening questions prior to the visit, additional usage of staff PPE, and extensive cleaning of exam room while observing appropriate contact time as indicated for disinfecting solutions.  Subjective:     Patient ID: Warren [name] , male    DOB: November 22, 1945 , 75 y.o.   MRN: [id number]   Chief Complaint  Patient presents with  . Diabetes  . Hypertension    HPI  The patient is here today for a follow-up on his diabetes and blood pressure.  The patient hasn't been compliant with his medications.  He states that he doesn't need them.  The patient states that if it's not free he isn't doing it when asked about his yearly eye exam.  He is also scheduled for AWV with [facility] today.   Diabetes He presents for his follow-up diabetic visit. He has type 2 diabetes mellitus. There are no hypoglycemic associated symptoms. Pertinent negatives for diabetes include no blurred vision and no chest pain. There are no hypoglycemic complications. Diabetic complications include nephropathy. Risk factors for coronary artery disease include diabetes mellitus, dyslipidemia, hypertension, male sex and sedentary lifestyle. He never participates in exercise. An ACE inhibitor/angiotensin II receptor blocker is being taken. He does not see a podiatrist.Eye exam is current.  Hypertension This is a chronic problem. The current episode started more than 1 year ago. The problem has been gradually improving since onset. The problem is controlled. Pertinent negatives include no blurred vision, chest pain, palpitations or shortness of breath. Risk factors for coronary artery disease include diabetes  mellitus, dyslipidemia, male gender and sedentary lifestyle. Past treatments include angiotensin blockers and diuretics. The current treatment provides moderate improvement. Compliance problems include exercise.  Hypertensive end-organ damage includes kidney disease.     Past Medical History:  Diagnosis Date  . Chronic kidney disease    stage 2  . Diabetes mellitus without complication ([place])   . Hypertension   . Malaise and fatigue      Family History  Problem Relation Age of Onset  . Hypertension Mother   . Hypothyroidism Mother   . Alzheimer's disease Father   . Prostate cancer Father   . Prostate cancer Brother      Current Outpatient Medications:  .  amLODipine (NORVASC) 10 MG tablet, Take 1 tablet (10 mg total) by mouth daily. (Patient not taking: Reported on 12/13/2020), Disp: 90 tablet, Rfl: 2 .  atorvastatin (LIPITOR) 20 MG tablet, Take 1 tablet (20 mg total) by mouth daily. (Patient not taking: Reported on 12/13/2020), Disp: 90 tablet, Rfl: 2 .  Blood Glucose Monitoring Suppl (ONE TOUCH ULTRA 2) w/Device KIT, Use as directed to check blood sugars 1 time per day dx: e11.65 (Patient not taking: Reported on 12/13/2020), Disp: 1 each, Rfl: 1 .  glucose blood (ONE TOUCH ULTRA TEST) test strip, Use as instructed to check blood sugars 1 time per day dx: e11.65 (Patient not taking: Reported on 12/13/2020), Disp: 50 each, Rfl: 11 .  Magnesium 250 MG TABS, Take 1 tablet by mouth daily. (Patient not taking: Reported on 12/13/2020), Disp: , Rfl:  .  sildenafil (REVATIO) 20 MG tablet, Take 1 tablet (20 mg total) by mouth as needed. MAX 3  TO 5 TABLETS PER DAY, Disp: 60 tablet, Rfl: 0 .  sitaGLIPtin-metformin (JANUMET) 50-1000 MG tablet, Take 1 tablet by mouth daily. (Patient not taking: Reported on 12/13/2020), Disp: , Rfl:  .  telmisartan-hydrochlorothiazide (MICARDIS HCT) 80-12.5 MG tablet, Take 1 tablet by mouth daily. (Patient not taking: Reported on 12/13/2020), Disp: , Rfl:    No Known Allergies    Review of Systems  Constitutional: Negative.   Eyes: Negative for blurred vision.  Respiratory: Negative.  Negative for shortness of breath.   Cardiovascular: Negative.  Negative for chest pain and palpitations.  Gastrointestinal: Negative.   Psychiatric/Behavioral: Negative.   All other systems reviewed and are negative.    Today's Vitals   12/13/20 0837  BP: 136/82  Pulse: 67  Temp: 98.1 F (36.7 C)  TempSrc: Oral  Weight: 211 lb 9.6 oz (96 kg)  Height: 5' 8" (1.727 m)  PainSc: 0-No pain   Body mass index is 32.17 kg/m.  Wt Readings from Last 3 Encounters:  12/13/20 211 lb (95.7 kg)  12/13/20 211 lb 9.6 oz (96 kg)  03/26/20 203 lb (92.1 kg)   BP Readings from Last 3 Encounters:  12/13/20 136/82  12/13/20 136/82  03/26/20 (!) 154/90   Objective:  Physical Exam Vitals and nursing note reviewed.  Constitutional:      Appearance: Normal appearance.  HENT:     Head: Normocephalic and atraumatic.     Nose:     Comments: Masked     Mouth/Throat:     Comments: Masked  Cardiovascular:     Rate and Rhythm: Normal rate and regular rhythm.     Heart sounds: Normal heart sounds.  Pulmonary:     Effort: Pulmonary effort is normal.     Breath sounds: Normal breath sounds.  Musculoskeletal:     Cervical back: Normal range of motion.  Skin:    General: Skin is warm.  Neurological:     General: No focal deficit present.     Mental Status: He is alert.  Psychiatric:        Mood and Affect: Mood normal.         Assessment And Plan:     1. Type 2 diabetes mellitus with stage 2 chronic kidney disease, without long-term current use of insulin (HCC) Comments: Chronic, I will check labs as listed below. He refuses to take rx meds. Advised to avoid sugary beverages/processed foods, exercise 150 min/week.  - CMP14+EGFR - CBC - Lipid panel - Hemoglobin A1c  2. Hypertensive nephropathy Comments: Chronic, fair control. He is no longer taking meds. Encouraged to follow  low sodium diet.   3. Erectile dysfunction due to arterial insufficiency Comments: He will c/w sildenafil prn.  4. Class 1 obesity due to excess calories with serious comorbidity and body mass index (BMI) of 32.0 to 32.9 in adult Comments: He is encouraged to initially strive for BMI less than 30 to decrease cardiac risk. He is advised to exercise no less than 150 minutes per week.     5. Nocturia - PSA  Patient was given opportunity to ask questions. Patient verbalized understanding of the plan and was able to repeat key elements of the plan. All questions were answered to their satisfaction.   I, Maximino Greenland, MD, have reviewed all documentation for this visit. The documentation on 12/13/20 for the exam, diagnosis, procedures, and orders are all accurate and complete.   IF YOU HAVE BEEN REFERRED TO A SPECIALIST, IT MAY TAKE 1-2 WEEKS TO  SCHEDULE/PROCESS THE REFERRAL. IF YOU HAVE NOT HEARD FROM US/SPECIALIST IN TWO WEEKS, PLEASE GIVE [place] A CALL AT [phone].   THE PATIENT IS ENCOURAGED TO PRACTICE SOCIAL DISTANCING DUE TO THE COVID-19 PANDEMIC.

## 2020-12-13 NOTE — Patient Instructions (Signed)
Diabetes Mellitus and Exercise Exercising regularly is important for overall health, especially for people who have diabetes mellitus. Exercising is not only about losing weight. It has many other health benefits, such as increasing muscle strength and bone density and reducing body fat and stress. This leads to improved fitness, flexibility, and endurance, all of which result in better overall health. What are the benefits of exercise if I have diabetes? Exercise has many benefits for people with diabetes. They include:  Helping to lower and control blood sugar (glucose).  Helping the body to respond better to the hormone insulin by improving insulin sensitivity.  Reducing how much insulin the body needs.  Lowering the risk for heart disease by: ? Lowering "bad" cholesterol and triglyceride levels. ? Increasing "good" cholesterol levels. ? Lowering blood pressure. ? Lowering blood glucose levels. What is my activity plan? Your health care provider or certified diabetes educator can help you make a plan for the type and frequency of exercise that works for you. This is called your activity plan. Be sure to:  Get at least 150 minutes of medium-intensity or high-intensity exercise each week. Exercises may include brisk walking, biking, or water aerobics.  Do stretching and strengthening exercises, such as yoga or weight lifting, at least 2 times a week.  Spread out your activity over at least 3 days of the week.  Get some form of physical activity each day. ? Do not go more than 2 days in a row without some kind of physical activity. ? Avoid being inactive for more than 90 minutes at a time. Take frequent breaks to walk or stretch.  Choose exercises or activities that you enjoy. Set realistic goals.  Start slowly and gradually increase your exercise intensity over time.   How do I manage my diabetes during exercise? Monitor your blood glucose  Check your blood glucose before and  after exercising. If your blood glucose is: ? 240 mg/dL (13.3 mmol/L) or higher before you exercise, check your urine for ketones. These are chemicals created by the liver. If you have ketones in your urine, do not exercise until your blood glucose returns to normal. ? 100 mg/dL (5.6 mmol/L) or lower, eat a snack containing 15-20 grams of carbohydrate. Check your blood glucose 15 minutes after the snack to make sure that your glucose level is above 100 mg/dL (5.6 mmol/L) before you start your exercise.  Know the symptoms of low blood glucose (hypoglycemia) and how to treat it. Your risk for hypoglycemia increases during and after exercise. Follow these tips and your health care provider's instructions  Keep a carbohydrate snack that is fast-acting for use before, during, and after exercise to help prevent or treat hypoglycemia.  Avoid injecting insulin into areas of the body that are going to be exercised. For example, avoid injecting insulin into: ? Your arms, when you are about to play tennis. ? Your legs, when you are about to go jogging.  Keep records of your exercise habits. Doing this can help you and your health care provider adjust your diabetes management plan as needed. Write down: ? Food that you eat before and after you exercise. ? Blood glucose levels before and after you exercise. ? The type and amount of exercise you have done.  Work with your health care provider when you start a new exercise or activity. He or she may need to: ? Make sure that the activity is safe for you. ? Adjust your insulin, other medicines, and food that   you eat.  Drink plenty of water while you exercise. This prevents loss of water (dehydration) and problems caused by a lot of heat in the body (heat stroke).   Where to find more information  American Diabetes Association: www.diabetes.org Summary  Exercising regularly is important for overall health, especially for people who have diabetes  mellitus.  Exercising has many health benefits. It increases muscle strength and bone density and reduces body fat and stress. It also lowers and controls blood glucose.  Your health care provider or certified diabetes educator can help you make an activity plan for the type and frequency of exercise that works for you.  Work with your health care provider to make sure any new activity is safe for you. Also work with your health care provider to adjust your insulin, other medicines, and the food you eat. This information is not intended to replace advice given to you by your health care provider. Make sure you discuss any questions you have with your health care provider. Document Revised: 04/25/2019 Document Reviewed: 04/25/2019 Elsevier Patient Education  2021 Elsevier Inc.  

## 2020-12-13 NOTE — Patient Instructions (Signed)
[name] , Thank you for taking time to come for your Medicare Wellness Visit. I appreciate your ongoing commitment to your health goals. Please review the following plan we discussed and let me know if I can assist you in the future.   Screening recommendations/referrals: Colonoscopy: cologuard 11/10/2018, due 11/09/2021 Recommended yearly ophthalmology/optometry visit for glaucoma screening and checkup Recommended yearly dental visit for hygiene and checkup  Vaccinations: Influenza vaccine: decline Pneumococcal vaccine: decline Tdap vaccine: completed 07/19/2013, due 07/20/2023 Shingles vaccine: decline   Covid-19:  06/29/2020, 12/26/2019, 12/01/2019  Advanced directives: Advance directive discussed with you today. Even though you declined this today please call our office should you change your mind and we can give you the proper paperwork for you to fill out.  Conditions/risks identified: none  Next appointment: Follow up in one year for your annual wellness visit.   Preventive Care 73 Years and Older, Male Preventive care refers to lifestyle choices and visits with your health care provider that can promote health and wellness. What does preventive care include?  A yearly physical exam. This is also called an annual well check.  Dental exams once or twice a year.  Routine eye exams. Ask your health care provider how often you should have your eyes checked.  Personal lifestyle choices, including:  Daily care of your teeth and gums.  Regular physical activity.  Eating a healthy diet.  Avoiding tobacco and drug use.  Limiting alcohol use.  Practicing safe sex.  Taking low doses of aspirin every day.  Taking vitamin and mineral supplements as recommended by your health care provider. What happens during an annual well check? The services and screenings done by your health care provider during your annual well check will depend on your age, overall health, lifestyle risk  factors, and family history of disease. Counseling  Your health care provider may ask you questions about your:  Alcohol use.  Tobacco use.  Drug use.  Emotional well-being.  Home and relationship well-being.  Sexual activity.  Eating habits.  History of falls.  Memory and ability to understand (cognition).  Work and work Astronomer. Screening  You may have the following tests or measurements:  Height, weight, and BMI.  Blood pressure.  Lipid and cholesterol levels. These may be checked every 5 years, or more frequently if you are over 39 years old.  Skin check.  Lung cancer screening. You may have this screening every year starting at age 73 if you have a 30-pack-year history of smoking and currently smoke or have quit within the past 15 years.  Fecal occult blood test (FOBT) of the stool. You may have this test every year starting at age 73.  Flexible sigmoidoscopy or colonoscopy. You may have a sigmoidoscopy every 5 years or a colonoscopy every 10 years starting at age 73.  Prostate cancer screening. Recommendations will vary depending on your family history and other risks.  Hepatitis C blood test.  Hepatitis B blood test.  Sexually transmitted disease (STD) testing.  Diabetes screening. This is done by checking your blood sugar (glucose) after you have not eaten for a while (fasting). You may have this done every 1-3 years.  Abdominal aortic aneurysm (AAA) screening. You may need this if you are a current or former smoker.  Osteoporosis. You may be screened starting at age 73 if you are at high risk. Talk with your health care provider about your test results, treatment options, and if necessary, the need for more tests. Vaccines  Your health care provider may recommend certain vaccines, such as:  Influenza vaccine. This is recommended every year.  Tetanus, diphtheria, and acellular pertussis (Tdap, Td) vaccine. You may need a Td booster every 10  years.  Zoster vaccine. You may need this after age 65.  Pneumococcal 13-valent conjugate (PCV13) vaccine. One dose is recommended after age 14.  Pneumococcal polysaccharide (PPSV23) vaccine. One dose is recommended after age 45. Talk to your health care provider about which screenings and vaccines you need and how often you need them. This information is not intended to replace advice given to you by your health care provider. Make sure you discuss any questions you have with your health care provider. Document Released: 08/24/2015 Document Revised: 04/16/2016 Document Reviewed: 05/29/2015 Elsevier Interactive Patient Education  2017 [place] Prevention in the Home Falls can cause injuries. They can happen to people of all ages. There are many things you can do to make your home safe and to help prevent falls. What can I do on the outside of my home?  Regularly fix the edges of walkways and driveways and fix any cracks.  Remove anything that might make you trip as you walk through a door, such as a raised step or threshold.  Trim any bushes or trees on the path to your home.  Use bright outdoor lighting.  Clear any walking paths of anything that might make someone trip, such as rocks or tools.  Regularly check to see if handrails are loose or broken. Make sure that both sides of any steps have handrails.  Any raised decks and porches should have guardrails on the edges.  Have any leaves, snow, or ice cleared regularly.  Use sand or salt on walking paths during winter.  Clean up any spills in your garage right away. This includes oil or grease spills. What can I do in the bathroom?  Use night lights.  Install grab bars by the toilet and in the tub and shower. Do not use towel bars as grab bars.  Use non-skid mats or decals in the tub or shower.  If you need to sit down in the shower, use a plastic, non-slip stool.  Keep the floor dry. Clean up any water that  spills on the floor as soon as it happens.  Remove soap buildup in the tub or shower regularly.  Attach bath mats securely with double-sided non-slip rug tape.  Do not have throw rugs and other things on the floor that can make you trip. What can I do in the bedroom?  Use night lights.  Make sure that you have a light by your bed that is easy to reach.  Do not use any sheets or blankets that are too big for your bed. They should not hang down onto the floor.  Have a firm chair that has side arms. You can use this for support while you get dressed.  Do not have throw rugs and other things on the floor that can make you trip. What can I do in the kitchen?  Clean up any spills right away.  Avoid walking on wet floors.  Keep items that you use a lot in easy-to-reach places.  If you need to reach something above you, use a strong step stool that has a grab bar.  Keep electrical cords out of the way.  Do not use floor polish or wax that makes floors slippery. If you must use wax, use non-skid floor wax.  Do  not have throw rugs and other things on the floor that can make you trip. What can I do with my stairs?  Do not leave any items on the stairs.  Make sure that there are handrails on both sides of the stairs and use them. Fix handrails that are broken or loose. Make sure that handrails are as long as the stairways.  Check any carpeting to make sure that it is firmly attached to the stairs. Fix any carpet that is loose or worn.  Avoid having throw rugs at the top or bottom of the stairs. If you do have throw rugs, attach them to the floor with carpet tape.  Make sure that you have a light switch at the top of the stairs and the bottom of the stairs. If you do not have them, ask someone to add them for you. What else can I do to help prevent falls?  Wear shoes that:  Do not have high heels.  Have rubber bottoms.  Are comfortable and fit you well.  Are closed at the  toe. Do not wear sandals.  If you use a stepladder:  Make sure that it is fully opened. Do not climb a closed stepladder.  Make sure that both sides of the stepladder are locked into place.  Ask someone to hold it for you, if possible.  Clearly mark and make sure that you can see:  Any grab bars or handrails.  First and last steps.  Where the edge of each step is.  Use tools that help you move around (mobility aids) if they are needed. These include:  Canes.  Walkers.  Scooters.  Crutches.  Turn on the lights when you go into a dark area. Replace any light bulbs as soon as they burn out.  Set up your furniture so you have a clear path. Avoid moving your furniture around.  If any of your floors are uneven, fix them.  If there are any pets around you, be aware of where they are.  Review your medicines with your doctor. Some medicines can make you feel dizzy. This can increase your chance of falling. Ask your doctor what other things that you can do to help prevent falls. This information is not intended to replace advice given to you by your health care provider. Make sure you discuss any questions you have with your health care provider. Document Released: 05/24/2009 Document Revised: 01/03/2016 Document Reviewed: 09/01/2014 Elsevier Interactive Patient Education  2017 [employer].

## 2020-12-13 NOTE — Progress Notes (Signed)
This visit occurred during the SARS-CoV-2 public health emergency.  Safety protocols were in place, including screening questions prior to the visit, additional usage of staff PPE, and extensive cleaning of exam room while observing appropriate contact time as indicated for disinfecting solutions.  Subjective:   Warren [name] is a 75 y.o. male who presents for Medicare Annual/Subsequent preventive examination.  Review of Systems     Cardiac Risk Factors include: advanced age (>22mn, >>37women);male gender     Objective:    Today's Vitals   12/13/20 0852  BP: 136/82  Pulse: 67  Temp: 98.1 F (36.7 C)  TempSrc: Oral  Weight: 211 lb (95.7 kg)  Height: _0  (1.727 m)   Body mass index is 32.08 kg/m.  Advanced Directives 12/13/2020 07/12/2019 05/26/2018  Does Patient Have a Medical Advance Directive? No No No  Would patient like information on creating a medical advance directive? No - Patient declined No - Patient declined Yes (MAU/Ambulatory/Procedural Areas - Information given)    Current Medications (verified) Outpatient Encounter Medications as of 12/13/2020  Medication Sig  . sildenafil (REVATIO) 20 MG tablet Take 1 tablet (20 mg total) by mouth as needed. MAX 3 TO 5 TABLETS PER DAY  . amLODipine (NORVASC) 10 MG tablet Take 1 tablet (10 mg total) by mouth daily. (Patient not taking: Reported on 12/13/2020)  . atorvastatin (LIPITOR) 20 MG tablet Take 1 tablet (20 mg total) by mouth daily. (Patient not taking: Reported on 12/13/2020)  . Blood Glucose Monitoring Suppl (ONE TOUCH ULTRA 2) w/Device KIT Use as directed to check blood sugars 1 time per day dx: e11.65 (Patient not taking: Reported on 12/13/2020)  . glucose blood (ONE TOUCH ULTRA TEST) test strip Use as instructed to check blood sugars 1 time per day dx: e11.65 (Patient not taking: Reported on 12/13/2020)  . Magnesium 250 MG TABS Take 1 tablet by mouth daily. (Patient not taking: Reported on 12/13/2020)  . sitaGLIPtin-metformin  (JANUMET) 50-1000 MG tablet Take 1 tablet by mouth daily. (Patient not taking: Reported on 12/13/2020)  . telmisartan-hydrochlorothiazide (MICARDIS HCT) 80-12.5 MG tablet Take 1 tablet by mouth daily. (Patient not taking: Reported on 12/13/2020)   No facility-administered encounter medications on file as of 12/13/2020.    Allergies (verified) Patient has no known allergies.   History: Past Medical History:  Diagnosis Date  . Chronic kidney disease    stage 2  . Diabetes mellitus without complication ([place]   . Hypertension   . Malaise and fatigue    Past Surgical History:  Procedure Laterality Date  . surgical repair     metatarsal repair.    Family History  Problem Relation Age of Onset  . Hypertension Mother   . Hypothyroidism Mother   . Alzheimer's disease Father   . Prostate cancer Father   . Prostate cancer Brother    Social History   Socioeconomic History  . Marital status: Single    Spouse name: Not on file  . Number of children: Not on file  . Years of education: Not on file  . Highest education level: Not on file  Occupational History  . Occupation: retired  Tobacco Use  . Smoking status: Never Smoker  . Smokeless tobacco: Never Used  Vaping Use  . Vaping Use: Never used  Substance and Sexual Activity  . Alcohol use: Yes    Alcohol/week: 3.0 standard drinks    Types: 3 Cans of beer per week    Comment: daily  . Drug use: Yes  Types: Marijuana  . Sexual activity: Yes  Other Topics Concern  . Not on file  Social History Narrative  . Not on file   Social Determinants of Health   Financial Resource Strain: Low Risk   . Difficulty of Paying Living Expenses: Not hard at all  Food Insecurity: No Food Insecurity  . Worried About Charity fundraiser in the Last Year: Never true  . Ran Out of Food in the Last Year: Never true  Transportation Needs: No Transportation Needs  . Lack of Transportation (Medical): No  . Lack of Transportation (Non-Medical): No   Physical Activity: Inactive  . Days of Exercise per Week: 0 days  . Minutes of Exercise per Session: 0 min  Stress: No Stress Concern Present  . Feeling of Stress : Not at all  Social Connections: Not on file    Tobacco Counseling Counseling given: Not Answered   Clinical Intake:  Pre-visit preparation completed: Yes  Pain : No/denies pain     Nutritional Status: BMI > 30  Obese Nutritional Risks: None Diabetes: Yes  How often do you need to have someone help you when you read instructions, pamphlets, or other written materials from your doctor or pharmacy?: 1 - Never What is the last grade level you completed in school?: 74yr college  Diabetic? Yesdoes not Nutrition Risk Assessment:  Has the patient had any N/V/D within the last 2 months?  No  Does the patient have any non-healing wounds?  No  Has the patient had any unintentional weight loss or weight gain?  No   Diabetes:  Is the patient diabetic?  Yes  If diabetic, was a CBG obtained today?  No  Did the patient bring in their glucometer from home?  No  How often do you monitor your CBG's? Does not.   Financial Strains and Diabetes Management:  Are you having any financial strains with the device, your supplies or your medication? No .  Does the patient want to be seen by Chronic Care Management for management of their diabetes?  No  Would the patient like to be referred to a Nutritionist or for Diabetic Management?  No   Diabetic Exams:  Diabetic Eye Exam: Overdue for diabetic eye exam. Pt has been advised about the importance in completing this exam. Patient advised to call and schedule an eye exam. Diabetic Foot Exam: Overdue, Pt has been advised about the importance in completing this exam. Pt is scheduled for diabetic foot exam on next appointment.   Interpreter Needed?: No  Information entered by :: NAllen LPN   Activities of Daily Living In your present state of health, do you have any difficulty  performing the following activities: 12/13/2020 12/13/2020  Hearing? N N  Vision? N N  Difficulty concentrating or making decisions? N N  Walking or climbing stairs? N N  Dressing or bathing? N N  Doing errands, shopping? N N  Preparing Food and eating ? N -  Using the Toilet? N -  In the past six months, have you accidently leaked urine? N -  Do you have problems with loss of bowel control? N -  Managing your Medications? N -  Managing your Finances? N -  Housekeeping or managing your Housekeeping? N -  Some recent data might be hidden    Patient Care Team: SGlendale Chard MD as PCP - General (Internal Medicine)  Indicate any recent Medical Services you may have received from other than [facility] providers in the past  year (date may be approximate).     Assessment:   This is a routine wellness examination for Knoah.  Hearing/Vision screen No exam data present  Dietary issues and exercise activities discussed: Current Exercise Habits: The patient does not participate in regular exercise at present  Goals Addressed            This Visit's Progress   . Patient Stated       12/13/2020, no goals      Depression Screen PHQ 2/9 Scores 12/13/2020 12/13/2020 07/12/2019 02/03/2019 10/13/2018 07/06/2018 05/26/2018  PHQ - 2 Score 0 0 0 0 0 0 0  PHQ- 9 Score - - 0 - - - -    Fall Risk Fall Risk  12/13/2020 12/13/2020 07/12/2019 02/03/2019 10/13/2018  Falls in the past year? 0 0 0 0 0  Comment - - - - -  Risk for fall due to : No Fall Risks - Medication side effect - -  Follow up Falls evaluation completed;Education provided;Falls prevention discussed - Falls evaluation completed;Education provided;Falls prevention discussed - -    FALL RISK PREVENTION PERTAINING TO THE HOME:  Any stairs in or around the home? Yes  If so, are there any without handrails? No  Home free of loose throw rugs in walkways, pet beds, electrical cords, etc? Yes  Adequate lighting in your home to reduce risk of falls?  Yes   ASSISTIVE DEVICES UTILIZED TO PREVENT FALLS:  Life alert? No  Use of a cane, walker or w/c? No  Grab bars in the bathroom? Yes  Shower chair or bench in shower? No  Elevated toilet seat or a handicapped toilet? Yes   TIMED UP AND GO:  Was the test performed? No .     Cognitive Function:     6CIT Screen 12/13/2020 07/12/2019 05/26/2018  What Year? 0 points 0 points 0 points  What month? 0 points 0 points 0 points  What time? 0 points 0 points 0 points  Count back from 20 0 points 0 points 0 points  Months in reverse 0 points 0 points 0 points  Repeat phrase 2 points 2 points 0 points  Total Score 2 2 0    Immunizations Immunization History  Administered Date(s) Administered  . PFIZER(Purple Top)SARS-COV-2 Vaccination 12/01/2019, 12/26/2019, 06/29/2020  . Tdap 07/19/2013    TDAP status: Up to date  Flu Vaccine status: Declined, Education has been provided regarding the importance of this vaccine but patient still declined. Advised may receive this vaccine at local pharmacy or Health Dept. Aware to provide a copy of the vaccination record if obtained from local pharmacy or Health Dept. Verbalized acceptance and understanding.  Pneumococcal vaccine status: Declined,  Education has been provided regarding the importance of this vaccine but patient still declined. Advised may receive this vaccine at local pharmacy or Health Dept. Aware to provide a copy of the vaccination record if obtained from local pharmacy or Health Dept. Verbalized acceptance and understanding.   Covid-19 vaccine status: Completed vaccines  Qualifies for Shingles Vaccine? Yes   Zostavax completed No   Shingrix Completed?: No.    Education has been provided regarding the importance of this vaccine. Patient has been advised to call insurance company to determine out of pocket expense if they have not yet received this vaccine. Advised may also receive vaccine at local pharmacy or Health Dept. Verbalized  acceptance and understanding.  Screening Tests Health Maintenance  Topic Date Due  . OPHTHALMOLOGY EXAM  04/09/2019  . HEMOGLOBIN  A1C  08/05/2019  . FOOT EXAM  02/03/2020  . PNA vac Low Risk Adult (1 of 2 - PCV13) 12/13/2021 (Originally 04/14/2013)  . INFLUENZA VACCINE  03/11/2021  . Fecal DNA (Cologuard)  11/09/2021  . TETANUS/TDAP  07/20/2023  . COVID-19 Vaccine  Completed  . Hepatitis C Screening  Completed  . HPV VACCINES  Aged Out    Health Maintenance  Health Maintenance Due  Topic Date Due  . OPHTHALMOLOGY EXAM  04/09/2019  . HEMOGLOBIN A1C  08/05/2019  . FOOT EXAM  02/03/2020    Colorectal cancer screening: Type of screening: Cologuard. Completed 11/10/2018. Repeat every 3 years  Lung Cancer Screening: (Low Dose CT Chest recommended if Age 81-80 years, 30 pack-year currently smoking OR have quit w/in 15years.) does not qualify.   Lung Cancer Screening Referral: no  Additional Screening:  Hepatitis C Screening: does qualify; Completed 07/06/2018  Vision Screening: Recommended annual ophthalmology exams for early detection of glaucoma and other disorders of the eye. Is the patient up to date with their annual eye exam?  No  Who is the provider or what is the name of the office in which the patient attends annual eye exams? none If pt is not established with a provider, would they like to be referred to a provider to establish care? No .   Dental Screening: Recommended annual dental exams for proper oral hygiene  Community Resource Referral / Chronic Care Management: CRR required this visit?  No   CCM required this visit?  No      Plan:     I have personally reviewed and noted the following in the patient's chart:   . Medical and social history . Use of alcohol, tobacco or illicit drugs  . Current medications and supplements including opioid prescriptions. Patient is not currently taking opioid prescriptions. . Functional ability and status . Nutritional  status . Physical activity . Advanced directives . List of other physicians . Hospitalizations, surgeries, and ER visits in previous 12 months . Vitals . Screenings to include cognitive, depression, and falls . Referrals and appointments  In addition, I have reviewed and discussed with patient certain preventive protocols, quality metrics, and best practice recommendations. A written personalized care plan for preventive services as well as general preventive health recommendations were provided to patient.     Kellie Simmering, LPN   [id number]   Nurse Notes:

## 2020-12-14 ENCOUNTER — Telehealth: Payer: Self-pay

## 2020-12-14 LAB — HEMOGLOBIN A1C
Est. average glucose Bld gHb Est-mCnc: 283 mg/dL
Hgb A1c MFr Bld: 11.5 % — ABNORMAL HIGH (ref 4.8–5.6)

## 2020-12-14 LAB — CBC
Hematocrit: 43.2 % (ref 37.5–51.0)
Hemoglobin: 15.2 g/dL (ref 13.0–17.7)
MCH: 30.3 pg (ref 26.6–33.0)
MCHC: 35.2 g/dL (ref 31.5–35.7)
MCV: 86 fL (ref 79–97)
Platelets: 186 10*3/uL (ref 150–450)
RBC: 5.01 x10E6/uL (ref 4.14–5.80)
RDW: 13.1 % (ref 11.6–15.4)
WBC: 6.9 10*3/uL (ref 3.4–10.8)

## 2020-12-14 LAB — CMP14+EGFR
ALT: 12 IU/L (ref 0–44)
AST: 15 IU/L (ref 0–40)
Albumin/Globulin Ratio: 1.6 (ref 1.2–2.2)
Albumin: 4.2 g/dL (ref 3.7–4.7)
Alkaline Phosphatase: 68 IU/L (ref 44–121)
BUN/Creatinine Ratio: 11 (ref 10–24)
BUN: 14 mg/dL (ref 8–27)
Bilirubin Total: 0.7 mg/dL (ref 0.0–1.2)
CO2: 18 mmol/L — ABNORMAL LOW (ref 20–29)
Calcium: 9.7 mg/dL (ref 8.6–10.2)
Chloride: 101 mmol/L (ref 96–106)
Creatinine, Ser: 1.25 mg/dL (ref 0.76–1.27)
Globulin, Total: 2.6 g/dL (ref 1.5–4.5)
Glucose: 290 mg/dL — ABNORMAL HIGH (ref 65–99)
Potassium: 5.4 mmol/L — ABNORMAL HIGH (ref 3.5–5.2)
Sodium: 137 mmol/L (ref 134–144)
Total Protein: 6.8 g/dL (ref 6.0–8.5)
eGFR: 61 mL/min/{1.73_m2} (ref >59)

## 2020-12-14 LAB — PSA: Prostate Specific Ag, Serum: 1.3 ng/mL (ref 0.0–4.0)

## 2020-12-14 LAB — LIPID PANEL
Chol/HDL Ratio: 4.2 ratio (ref 0.0–5.0)
Cholesterol, Total: 278 mg/dL — ABNORMAL HIGH (ref 100–199)
HDL: 66 mg/dL (ref >39)
LDL Chol Calc (NIH): 179 mg/dL — ABNORMAL HIGH (ref 0–99)
Triglycerides: 182 mg/dL — ABNORMAL HIGH (ref 0–149)
VLDL Cholesterol Cal: 33 mg/dL (ref 5–40)

## 2020-12-14 NOTE — Telephone Encounter (Signed)
Left a detailed message.

## 2020-12-14 NOTE — Telephone Encounter (Signed)
-----   Message from Dorothyann Peng, MD sent at 12/14/2020 10:03 AM EDT ----- Your blood sugar is 290, we need to resume meds. Potassium level is elevated, be sure to stay well hydrated. Kidney function is stable in stage 2 range.   Your LDL, bad cholesterol is 179 - should be less than 70 for someone with diabetes. Need to start chol meds. We can send rx atorvastatin 40mg  daily to your local pharmacy.   Your hba1c is 11.5, again, we need to start meds. You are now in range where insulin is needed to treat you. You may come in next week for insulin teaching. Please cut out sugary beverages, including diet drinks. Every day your sugar is elevated, it is affecting your brain, eyes, heart, kidneys and may eventually affect your erectile function.   Prostate test is within normal limits. Let me know how you wish to proceed.   RS

## 2020-12-26 ENCOUNTER — Telehealth: Payer: Self-pay

## 2020-12-26 NOTE — Telephone Encounter (Signed)
The pt said that he would like a mediation that is in pill form to help with his diabetes but not the janumet because it was causing him to act strange.

## 2020-12-27 ENCOUNTER — Telehealth: Payer: Self-pay

## 2020-12-27 ENCOUNTER — Other Ambulatory Visit: Payer: Self-pay

## 2020-12-27 MED ORDER — ATORVASTATIN CALCIUM 40 MG PO TABS: 40.0000 mg | ORAL_TABLET | Freq: Every day | ORAL | 1 refills | Status: DC

## 2020-12-27 NOTE — Telephone Encounter (Signed)
I left the pt a message that Dr. Allyne Gee wants the pt to pickup samples of Farxiga 10 mg, take 1 a day, the pt needs to drink plenty of water because this med can cause a yeast infection.  The pt also needs a f/u appt in 6wks.

## 2021-01-14 ENCOUNTER — Other Ambulatory Visit: Payer: Self-pay

## 2021-01-14 ENCOUNTER — Encounter: Payer: Self-pay | Admitting: Internal Medicine

## 2021-01-14 ENCOUNTER — Ambulatory Visit (INDEPENDENT_AMBULATORY_CARE_PROVIDER_SITE_OTHER): Payer: Medicare HMO | Admitting: Internal Medicine

## 2021-01-14 VITALS — BP 136/80 | HR 64 | Temp 98.4°F | Ht 68.0 in | Wt 209.0 lb

## 2021-01-14 DIAGNOSIS — I129 Hypertensive chronic kidney disease with stage 1 through stage 4 chronic kidney disease, or unspecified chronic kidney disease: Secondary | ICD-10-CM | POA: Diagnosis not present

## 2021-01-14 DIAGNOSIS — Z6832 Body mass index (BMI) 32.0-32.9, adult: Secondary | ICD-10-CM

## 2021-01-14 DIAGNOSIS — N182 Chronic kidney disease, stage 2 (mild): Secondary | ICD-10-CM

## 2021-01-14 DIAGNOSIS — E1122 Type 2 diabetes mellitus with diabetic chronic kidney disease: Secondary | ICD-10-CM

## 2021-01-14 DIAGNOSIS — E6609 Other obesity due to excess calories: Secondary | ICD-10-CM | POA: Diagnosis not present

## 2021-01-14 LAB — BMP8+EGFR
BUN/Creatinine Ratio: 9 — ABNORMAL LOW (ref 10–24)
BUN: 11 mg/dL (ref 8–27)
CO2: 23 mmol/L (ref 20–29)
Calcium: 9.4 mg/dL (ref 8.6–10.2)
Chloride: 101 mmol/L (ref 96–106)
Creatinine, Ser: 1.19 mg/dL (ref 0.76–1.27)
Glucose: 308 mg/dL — ABNORMAL HIGH (ref 65–99)
Potassium: 4.9 mmol/L (ref 3.5–5.2)
Sodium: 137 mmol/L (ref 134–144)
eGFR: 65 mL/min/{1.73_m2} (ref >59)

## 2021-01-14 MED ORDER — ONETOUCH ULTRA 2 W/DEVICE KIT: PACK | 1 refills | Status: DC

## 2021-01-14 MED ORDER — GLUCOSE BLOOD VI STRP: ORAL_STRIP | 11 refills | Status: DC

## 2021-01-14 NOTE — Patient Instructions (Signed)
Diabetes Mellitus and Foot Care Foot care is an important part of your health, especially when you have diabetes. Diabetes may cause you to have problems because of poor blood flow (circulation) to your feet and legs, which can cause your skin to:  Become thinner and drier.  Break more easily.  Heal more slowly.  Peel and crack. You may also have nerve damage (neuropathy) in your legs and feet, causing decreased feeling in them. This means that you may not notice minor injuries to your feet that could lead to more serious problems. Noticing and addressing any potential problems early is the best way to prevent future foot problems. How to care for your feet Foot hygiene  Wash your feet daily with warm water and mild soap. Do not use hot water. Then, pat your feet and the areas between your toes until they are completely dry. Do not soak your feet as this can dry your skin.  Trim your toenails straight across. Do not dig under them or around the cuticle. File the edges of your nails with an emery board or nail file.  Apply a moisturizing lotion or petroleum jelly to the skin on your feet and to dry, brittle toenails. Use lotion that does not contain alcohol and is unscented. Do not apply lotion between your toes.   Shoes and socks  Wear clean socks or stockings every day. Make sure they are not too tight. Do not wear knee-high stockings since they may decrease blood flow to your legs.  Wear shoes that fit properly and have enough cushioning. Always look in your shoes before you put them on to be sure there are no objects inside.  To break in new shoes, wear them for just a few hours a day. This prevents injuries on your feet. Wounds, scrapes, corns, and calluses  Check your feet daily for blisters, cuts, bruises, sores, and redness. If you cannot see the bottom of your feet, use a mirror or ask someone for help.  Do not cut corns or calluses or try to remove them with medicine.  If you  find a minor scrape, cut, or break in the skin on your feet, keep it and the skin around it clean and dry. You may clean these areas with mild soap and water. Do not clean the area with peroxide, alcohol, or iodine.  If you have a wound, scrape, corn, or callus on your foot, look at it several times a day to make sure it is healing and not infected. Check for: ? Redness, swelling, or pain. ? Fluid or blood. ? Warmth. ? Pus or a bad smell.   General tips  Do not cross your legs. This may decrease blood flow to your feet.  Do not use heating pads or hot water bottles on your feet. They may burn your skin. If you have lost feeling in your feet or legs, you may not know this is happening until it is too late.  Protect your feet from hot and cold by wearing shoes, such as at the beach or on hot pavement.  Schedule a complete foot exam at least once a year (annually) or more often if you have foot problems. Report any cuts, sores, or bruises to your health care provider immediately. Where to find more information  American Diabetes Association: www.diabetes.org  Association of Diabetes Care & Education Specialists: www.diabeteseducator.org Contact a health care provider if:  You have a medical condition that increases your risk of infection and   you have any cuts, sores, or bruises on your feet.  You have an injury that is not healing.  You have redness on your legs or feet.  You feel burning or tingling in your legs or feet.  You have pain or cramps in your legs and feet.  Your legs or feet are numb.  Your feet always feel cold.  You have pain around any toenails. Get help right away if:  You have a wound, scrape, corn, or callus on your foot and: ? You have pain, swelling, or redness that gets worse. ? You have fluid or blood coming from the wound, scrape, corn, or callus. ? Your wound, scrape, corn, or callus feels warm to the touch. ? You have pus or a bad smell coming from  the wound, scrape, corn, or callus. ? You have a fever. ? You have a red line going up your leg. Summary  Check your feet every day for blisters, cuts, bruises, sores, and redness.  Apply a moisturizing lotion or petroleum jelly to the skin on your feet and to dry, brittle toenails.  Wear shoes that fit properly and have enough cushioning.  If you have foot problems, report any cuts, sores, or bruises to your health care provider immediately.  Schedule a complete foot exam at least once a year (annually) or more often if you have foot problems. This information is not intended to replace advice given to you by your health care provider. Make sure you discuss any questions you have with your health care provider. Document Revised: 02/16/2020 Document Reviewed: 02/16/2020 Elsevier Patient Education  2021 Elsevier Inc.  

## 2021-01-14 NOTE — Progress Notes (Signed)
I,Warren [name],acting as a Education administrator for Warren Greenland, MD.,have documented all relevant documenta  This visit occurred during the SARS-CoV-2 public health emergency.  Safety protocols were in place, including screening questions prior to the visit, additional usage of staff PPE, and extensive cleaning of exam room while observing appropriate contact time as indicated for disinfecting solutions.  Subjective:     Patient ID: Warren [name] , male    DOB: [birth year] , 75 y.o.   MRN: [id number]   Chief Complaint  Patient presents with   Diabetes    HPI  The patient is here today for a follow-up on his diabetes.  Patient recently started on Farxiga.  The pt said feels ok on the new diabetes medication.  Diabetes He presents for his follow-up diabetic visit. He has type 2 diabetes mellitus. There are no hypoglycemic associated symptoms. Pertinent negatives for diabetes include no blurred vision and no chest pain. There are no hypoglycemic complications. Diabetic complications include nephropathy. Risk factors for coronary artery disease include diabetes mellitus, dyslipidemia, hypertension, male sex and sedentary lifestyle. He never participates in exercise. An ACE inhibitor/angiotensin II receptor blocker is being taken. He does not see a podiatrist.Eye exam is current.  Hypertension This is a chronic problem. The current episode started more than 1 year ago. The problem has been gradually improving since onset. The problem is controlled. Pertinent negatives include no blurred vision, chest pain, palpitations or shortness of breath. Risk factors for coronary artery disease include diabetes mellitus, dyslipidemia, male gender and sedentary lifestyle. Past treatments include angiotensin blockers and diuretics. The current treatment provides moderate improvement. Compliance problems include exercise.  Hypertensive end-organ damage includes kidney disease.    Past Medical History:  Diagnosis Date    Chronic kidney disease    stage 2   Diabetes mellitus without complication (HCC)    Hypertension    Malaise and fatigue      Family History  Problem Relation Age of Onset   Hypertension Mother    Hypothyroidism Mother    Alzheimer's disease Father    Prostate cancer Father    Prostate cancer Brother      Current Outpatient Medications:    dapagliflozin propanediol (FARXIGA) 10 MG TABS tablet, Take 10 mg by mouth daily., Disp: , Rfl:    sildenafil (REVATIO) 20 MG tablet, Take 1 tablet (20 mg total) by mouth as needed. MAX 3 TO 5 TABLETS PER DAY, Disp: 60 tablet, Rfl: 0   amLODipine (NORVASC) 10 MG tablet, Take 1 tablet (10 mg total) by mouth daily. (Patient not taking: Reported on 01/14/2021), Disp: 90 tablet, Rfl: 2   atorvastatin (LIPITOR) 40 MG tablet, Take 1 tablet (40 mg total) by mouth daily. (Patient not taking: Reported on 01/14/2021), Disp: 90 tablet, Rfl: 1   Blood Glucose Monitoring Suppl (ONE TOUCH ULTRA 2) w/Device KIT, Use as directed to check blood sugars 1 time per day dx: e11.65, Disp: 1 kit, Rfl: 1   glucose blood (ONE TOUCH ULTRA TEST) test strip, Use as instructed to check blood sugars 1 time per day dx: e11.65 dx: e11.22, Disp: 50 each, Rfl: 11   Magnesium 250 MG TABS, Take 1 tablet by mouth daily. (Patient not taking: Reported on 01/14/2021), Disp: , Rfl:    sitaGLIPtin-metformin (JANUMET) 50-1000 MG tablet, Take 1 tablet by mouth daily. (Patient not taking: Reported on 01/14/2021), Disp: , Rfl:    telmisartan-hydrochlorothiazide (MICARDIS HCT) 80-12.5 MG tablet, Take 1 tablet by mouth daily. (Patient not taking: Reported on 01/14/2021),  Disp: , Rfl:    No Known Allergies   Review of Systems  Constitutional: Negative.   Eyes:  Negative for blurred vision.  Respiratory: Negative.  Negative for shortness of breath.   Cardiovascular: Negative.  Negative for chest pain and palpitations.  Gastrointestinal: Negative.   Neurological: Negative.   Psychiatric/Behavioral:  Negative.      Today's Vitals   01/14/21 1530 01/14/21 1538  BP: (!) 164/92 136/80  Pulse: 64   Temp: 98.4 F (36.9 C)   TempSrc: Oral   Weight: 209 lb (94.8 kg)   Height: 5' 8" (1.727 m)    Body mass index is 31.78 kg/m.  Wt Readings from Last 3 Encounters:  01/14/21 209 lb (94.8 kg)  12/13/20 211 lb (95.7 kg)  12/13/20 211 lb 9.6 oz (96 kg)   BP Readings from Last 3 Encounters:  01/14/21 136/80  12/13/20 136/82  12/13/20 136/82   Objective:  Physical Exam Vitals and nursing note reviewed.  Constitutional:      Appearance: Normal appearance.  HENT:     Head: Normocephalic and atraumatic.     Nose:     Comments: Masked     Mouth/Throat:     Comments: Masked  Cardiovascular:     Rate and Rhythm: Normal rate and regular rhythm.     Heart sounds: Normal heart sounds.  Pulmonary:     Effort: Pulmonary effort is normal.     Breath sounds: Normal breath sounds.  Musculoskeletal:     Cervical back: Normal range of motion.  Skin:    General: Skin is warm.  Neurological:     General: No focal deficit present.     Mental Status: He is alert.  Psychiatric:        Mood and Affect: Mood normal.        Assessment And Plan:     1. Type 2 diabetes mellitus with stage 2 chronic kidney disease, without long-term current use of insulin (HCC) Comments: Chronic. I will refer him to [place] for med assistance for Farxiga.  I will send rx meter to his local pharmacy. I will check renal fxn today.  - AMB Referral to [facility]ordinaton - BMP8+EGFR  2. Hypertensive nephropathy Comments: Uncontrolled. Repeat BP improved, down to 136/80. He does have h/o HTN, he does not wish to take meds. Advised to follow low sodium diet, take Mg nightly. - AMB Referral to [place]  3. Class 1 obesity due to excess calories with serious comorbidity and body mass index (BMI) of 32.0 to 32.9 in adult  He is encouraged to initially strive for BMI less than 30 to  decrease cardiac risk. He is advised to exercise no less than 150 minutes per week.    Patient was given opportunity to ask questions. Patient verbalized understanding of the plan and was able to repeat key elements of the plan. All questions were answered to their satisfaction.   I, Warren Greenland, MD, have reviewed all documentation for this visit. The documentation on 01/14/21 for the exam, diagnosis, procedures, and orders are all accurate and complete.   IF YOU HAVE BEEN REFERRED TO A SPECIALIST, IT MAY TAKE 1-2 WEEKS TO SCHEDULE/PROCESS THE REFERRAL. IF YOU HAVE NOT HEARD FROM US/SPECIALIST IN TWO WEEKS, PLEASE GIVE [place] A CALL AT [phone].   THE PATIENT IS ENCOURAGED TO PRACTICE SOCIAL DISTANCING DUE TO THE COVID-19 PANDEMIC.  tion on the behalf of Warren Greenland, MD,as directed by  Warren Greenland, MD while in the presence of Warren Greenland, MD.

## 2021-01-15 ENCOUNTER — Telehealth: Payer: Self-pay | Admitting: *Deleted

## 2021-01-15 NOTE — Chronic Care Management (AMB) (Signed)
  Chronic Care Management   Outreach Note  01/15/2021 Name: Warren [name] MRN: [id number] DOB: October 23, 1945  Warren [name] is a 75 y.o. year old male who is a primary care patient of Dorothyann Peng, MD. I reached out to Carmela Hurt by phone today in response to a referral sent by Mr. Riker Collier PCP, Dr. Allyne Gee.     An unsuccessful telephone outreach was attempted today. The patient was referred to the case management team for assistance with care management and care coordination.   Follow Up Plan: A HIPAA compliant phone message was left for the patient providing contact information and requesting a return call.  The care management team will reach out to the patient again over the next 7 days. If patient returns call to provider office, please advise to call Embedded Care Management Care Guide Gwenevere Ghazi at [phone].   Gwenevere Ghazi  Care Guide, Embedded Care Coordination [facility]

## 2021-01-24 NOTE — Chronic Care Management (AMB) (Signed)
  Care Management  Note   01/24/2021 Name: Warren [name] MRN: [id number] DOB: March 30, 1946  Warren [name] is a 75 y.o. year old male who is a primary care patient of Glendale Chard, MD. The care management team was consulted for assistance with chronic disease management and care coordination needs.   Mr. Suder was given information about Care Management services today including:  CCM service includes personalized support from designated clinical staff supervised by the physician, including individualized plan of care and coordination with other care providers 24/7 contact phone numbers for assistance for urgent and routine care needs. Service will only be billed when office clinical staff spend 20 minutes or more in a month to coordinate care. Only one practitioner may furnish and bill the service in a calendar month. The patient may stop CCM services at amy time (effective at the end of the month) by phone call to the office staff. The patient will be responsible for cost sharing (co-pay) or up to 20% of the service fee (after annual deductible is met)  Patient agreed to services and verbal consent obtained.  Follow up plan:   Telephone follow up appointment with care management team member scheduled for:  01/29/21 with Pharm D   Julian Hy, [place] [facility]  Direct Dial: [phone]

## 2021-01-28 ENCOUNTER — Telehealth: Payer: Self-pay

## 2021-01-28 NOTE — Chronic Care Management (AMB) (Signed)
    Chronic Care Management Pharmacy Assistant   Name: Warren [name]  MRN: [id number] DOB: 08-26-1945   Reason for Encounter: Chart review for CPP visit on 01-29-2021   Conditions to be addressed/monitored: HTN, HLD, and DMII  Recent office visits:  01-14-2021 Glendale Chard, MD. Labs ordered CMP14+EGFR = Glucose 290, Potassium 5.4, CO2 18. Hemoglobin 11.5. Lipid Cholesterol total=278, Triglycerides=182, LDL= 179.   Recent consult visits:  None  Hospital visits:  None in previous 6 months  Medications: Outpatient Encounter Medications as of 01/28/2021  Medication Sig   amLODipine (NORVASC) 10 MG tablet Take 1 tablet (10 mg total) by mouth daily. (Patient not taking: Reported on 01/14/2021)   atorvastatin (LIPITOR) 40 MG tablet Take 1 tablet (40 mg total) by mouth daily. (Patient not taking: Reported on 01/14/2021)   Blood Glucose Monitoring Suppl (ONE TOUCH ULTRA 2) w/Device KIT Use as directed to check blood sugars 1 time per day dx: e11.65   dapagliflozin propanediol (FARXIGA) 10 MG TABS tablet Take 10 mg by mouth daily.   glucose blood (ONE TOUCH ULTRA TEST) test strip Use as instructed to check blood sugars 1 time per day dx: e11.65 dx: e11.22   Magnesium 250 MG TABS Take 1 tablet by mouth daily. (Patient not taking: Reported on 01/14/2021)   sildenafil (REVATIO) 20 MG tablet Take 1 tablet (20 mg total) by mouth as needed. MAX 3 TO 5 TABLETS PER DAY   sitaGLIPtin-metformin (JANUMET) 50-1000 MG tablet Take 1 tablet by mouth daily. (Patient not taking: Reported on 01/14/2021)   telmisartan-hydrochlorothiazide (MICARDIS HCT) 80-12.5 MG tablet Take 1 tablet by mouth daily. (Patient not taking: Reported on 01/14/2021)   No facility-administered encounter medications on file as of 01/28/2021.   Have you seen any other providers since your last visit? Patient states no Any changes in your medications or health? Patient stated no Any side effects from any medications? Patient stated no Do you  have an symptoms or problems not managed by your medications? Patient stated no Any concerns about your health right now? Patient stated no Has your provider asked that you check blood pressure, blood sugar, or follow special diet at home? Patient stated he checks blood pressure regularly and has put himself on a special diet. Patient states he is cutting out red meat and eating more fish, chicken and vegetables. Do you get any type of exercise on a regular basis? Patient states he walks often and is very active during the day. Can you think of a goal you would like to reach for your health? Patient states he would like to get his diabetes under control. Do you have any problems getting your medications? Patient states no Is there anything that you would like to discuss during the appointment? Patient stated the only thing that came to his mind is managing his diabetes.  Please bring medications and supplements to appointment  NOTES: Patient states he was prescribed Atorvastatin but hasn't picked medication up. Patient states PCP has been providing samples of Farxiga and he takes Sildenafil as needed. Patient states those are the only 3 medications he's taking at the moment.  Star Rating Drugs: Telmisartan-HCTZ 80-12.5 mg- Last filled 05-03-2018 [facility] [place] Atorvastatin 20 mg- Last filled 01-20-2019 [facility] [place] Farxiga 10 mg- No fill history Janumet 50-1000 mg- No fill history  [place] Clinical Pharmacist Assistant [phone]

## 2021-01-29 ENCOUNTER — Ambulatory Visit (INDEPENDENT_AMBULATORY_CARE_PROVIDER_SITE_OTHER): Payer: Medicare HMO

## 2021-01-29 ENCOUNTER — Telehealth: Payer: Self-pay

## 2021-01-29 DIAGNOSIS — E1122 Type 2 diabetes mellitus with diabetic chronic kidney disease: Secondary | ICD-10-CM | POA: Diagnosis not present

## 2021-01-29 DIAGNOSIS — N182 Chronic kidney disease, stage 2 (mild): Secondary | ICD-10-CM | POA: Diagnosis not present

## 2021-01-29 DIAGNOSIS — E7800 Pure hypercholesterolemia, unspecified: Secondary | ICD-10-CM

## 2021-01-29 DIAGNOSIS — I129 Hypertensive chronic kidney disease with stage 1 through stage 4 chronic kidney disease, or unspecified chronic kidney disease: Secondary | ICD-10-CM | POA: Diagnosis not present

## 2021-01-29 NOTE — Progress Notes (Signed)
Chronic Care Management Pharmacy Note  01/29/2021 Name:  Warren [name] MRN:  [id number] DOB:  [birth year]  Summary: Patient reports that he is not adherent to medication regimen. Sometimes he takes break from his medications.   Recommendations/Changes made from today's visit: Recommend patient take his medication daily.  Recommend patient complete patient assistance for medications that are very expensive.   Plan: Plan for patient to bring in financial information.  Patient to check his BS more frequently.    Subjective: Warren [name] is an 75 y.o. year old male who is a primary patient of Glendale Chard, MD.  The CCM team was consulted for assistance with disease management and care coordination needs.    Engaged with patient by telephone for initial visit in response to provider referral for pharmacy case management and/or care coordination services. The patient reports that he was born and raised in [place]. He has never been to the doctor until he was older. He started coming to Dr. Baird Cancer because his mother and father were patients of Dr. Baird Cancer. His mother is still living and she is 88 years old. His brother passed away from cancer. He is the caregiver to his mother. He also has a daughter who is a Nurse, mental health. He is a widow and lost his wife after a misdiagnosis of cancer.  He worked at [employer] for 31 years.   Consent to Services:  The patient was given the following information about Chronic Care Management services today, agreed to services, and gave verbal consent: 1. CCM service includes personalized support from designated clinical staff supervised by the primary care provider, including individualized plan of care and coordination with other care providers 2. 24/7 contact phone numbers for assistance for urgent and routine care needs. 3. Service will only be billed when office clinical staff spend 20 minutes or more in a month to coordinate care. 4. Only one  practitioner may furnish and bill the service in a calendar month. 5.The patient may stop CCM services at any time (effective at the end of the month) by phone call to the office staff. 6. The patient will be responsible for cost sharing (co-pay) of up to 20% of the service fee (after annual deductible is met). Patient agreed to services and consent obtained.  Patient Care Team: Glendale Chard, MD as PCP - General (Internal Medicine) Mayford Knife, [facility] (Pharmacist)  Recent office visits:  01-14-2021 Glendale Chard, MD. Labs ordered CMP14+EGFR = Glucose 290, Potassium 5.4, CO2 18. Hemoglobin 11.5. Lipid Cholesterol total=278, Triglycerides=182, LDL= 179.    Recent consult visits:  None  Hospital visits: None in previous 6 months   Objective:  Lab Results  Component Value Date   CREATININE 1.19 01/14/2021   BUN 11 01/14/2021   GFRNONAA 59 (L) 02/03/2019   GFRAA 68 02/03/2019   NA 137 01/14/2021   K 4.9 01/14/2021   CALCIUM 9.4 01/14/2021   CO2 23 01/14/2021   GLUCOSE 308 (H) 01/14/2021    Lab Results  Component Value Date/Time   HGBA1C 11.5 (H) 12/13/2020 10:05 AM   HGBA1C 7.4 (H) 02/03/2019 05:04 PM   MICROALBUR 30 03/26/2020 03:14 PM   MICROALBUR 10 02/08/2019 03:45 PM    Last diabetic Eye exam: No results found for: HMDIABEYEEXA  Last diabetic Foot exam: No results found for: HMDIABFOOTEX   Lab Results  Component Value Date   CHOL 278 (H) 12/13/2020   HDL 66 12/13/2020   LDLCALC 179 (H) 12/13/2020   TRIG 182 (H)  12/13/2020   CHOLHDL 4.2 12/13/2020    Hepatic Function Latest Ref Rng & Units 12/13/2020 02/03/2019 10/13/2018  Total Protein 6.0 - 8.5 g/dL 6.8 7.2 7.2  Albumin 3.7 - 4.7 g/dL 4.2 4.5 4.4  AST 0 - 40 IU/L _0 ALT 0 - 44 IU/L _1 Alk Phosphatase 44 - 121 IU/L 68 59 63  Total Bilirubin 0.0 - 1.2 mg/dL 0.7 0.5 0.8    Lab Results  Component Value Date/Time   TSH 1.60 10/01/2017 12:00 AM    CBC Latest Ref Rng & Units 12/13/2020 10/13/2018   WBC 3.4 - 10.8 x10E3/uL 6.9 8.9  Hemoglobin 13.0 - 17.7 g/dL 15.2 14.6  Hematocrit 37.5 - 51.0 % 43.2 42.6  Platelets 150 - 450 x10E3/uL 186 186    Lab Results  Component Value Date/Time   VD25OH 36.1 02/03/2018 12:00 AM    Clinical ASCVD: No  The 10-year ASCVD risk score Mikey Bussing DC Jr., et al., 2013) is: 38.8%   Values used to calculate the score:     Age: 75 years     Sex: Male     Is Non-Hispanic African American: Yes     Diabetic: Yes     Tobacco smoker: No     Systolic Blood Pressure: 027 mmHg     Is BP treated: Yes     HDL Cholesterol: 66 mg/dL     Total Cholesterol: 278 mg/dL    Depression screen [facility] 2/9 12/13/2020 12/13/2020 07/12/2019  Decreased Interest 0 0 0  Down, Depressed, Hopeless 0 0 0  PHQ - 2 Score 0 0 0  Altered sleeping - - 0  Tired, decreased energy - - 0  Change in appetite - - 0  Feeling bad or failure about yourself  - - 0  Trouble concentrating - - 0  Moving slowly or fidgety/restless - - 0  Suicidal thoughts - - 0  PHQ-9 Score - - 0  Difficult doing work/chores - - Not difficult at all      Social History   Tobacco Use  Smoking Status Never  Smokeless Tobacco Never   BP Readings from Last 3 Encounters:  01/14/21 136/80  12/13/20 136/82  12/13/20 136/82   Pulse Readings from Last 3 Encounters:  01/14/21 64  12/13/20 67  12/13/20 67   Wt Readings from Last 3 Encounters:  01/14/21 209 lb (94.8 kg)  12/13/20 211 lb (95.7 kg)  12/13/20 211 lb 9.6 oz (96 kg)   BMI Readings from Last 3 Encounters:  01/14/21 31.78 kg/m  12/13/20 32.08 kg/m  12/13/20 32.17 kg/m    Assessment/Interventions: Review of patient past medical history, allergies, medications, health status, including review of consultants reports, laboratory and other test data, was performed as part of comprehensive evaluation and provision of chronic care management services.   SDOH:  (Social Determinants of Health) assessments and interventions performed: Yes SDOH  Interventions    Flowsheet Row Most Recent Value  SDOH Interventions   Financial Strain Interventions --  [Patient assistance for high cost medications]      SDOH Screenings   Alcohol Screen: Not on file  Depression (PHQ2-9): Low Risk    PHQ-2 Score: 0  Financial Resource Strain: High Risk   Difficulty of Paying Living Expenses: Very hard  Food Insecurity: No Food Insecurity   Worried About Charity fundraiser in the Last Year: Never true   Ran Out of Food in the Last Year: Never true  Housing: Not  on file  Physical Activity: Inactive   Days of Exercise per Week: 0 days   Minutes of Exercise per Session: 0 min  Social Connections: Not on file  Stress: No Stress Concern Present   Feeling of Stress : Not at all  Tobacco Use: Low Risk    Smoking Tobacco Use: Never   Smokeless Tobacco Use: Never  Transportation Needs: No Transportation Needs   Lack of Transportation (Medical): No   Lack of Transportation (Non-Medical): No    CCM Care Plan  No Known Allergies  Medications Reviewed Today     Reviewed by Mayford Knife, [facility] (Pharmacist) on 01/29/21 at [phone]  Med List Status: <None>   Medication Order Taking? Sig Documenting Provider Last Dose Status Informant  amLODipine (NORVASC) 10 MG tablet [id number] Yes Take 1 tablet (10 mg total) by mouth daily. Glendale Chard, MD Taking Active   atorvastatin (LIPITOR) 40 MG tablet [id number] Yes Take 1 tablet (40 mg total) by mouth daily. Glendale Chard, MD Taking Active   Blood Glucose Monitoring Suppl (ONE TOUCH ULTRA 2) w/Device KIT [id number] Yes Use as directed to check blood sugars 1 time per day dx: e11.65 Glendale Chard, MD Taking Active   dapagliflozin propanediol (FARXIGA) 10 MG TABS tablet [id number] Yes Take 10 mg by mouth daily. [provider] Taking Active   glucose blood (ONE TOUCH ULTRA TEST) test strip [id number] Yes Use as instructed to check blood sugars 1 time per day dx: e11.65 dx: e11.22 Glendale Chard, MD  Taking Active   Magnesium 250 MG TABS [id number]  Take 1 tablet by mouth daily.  Patient not taking: Reported on 01/14/2021   [provider]  Active   sildenafil (REVATIO) 20 MG tablet [id number] Yes Take 1 tablet (20 mg total) by mouth as needed. MAX 3 TO 5 TABLETS PER DAY Glendale Chard, MD Taking Active   sitaGLIPtin-metformin (JANUMET) 50-1000 MG tablet [id number] No Take 1 tablet by mouth daily.  Patient not taking: No sig reported   [provider] Not Taking Active            Med Note Barbie Banner, [place]   Thu Feb 03, 2019  3:20 PM)    telmisartan-hydrochlorothiazide (MICARDIS HCT) 80-12.5 MG tablet [id number] Yes Take 1 tablet by mouth daily. 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    Immunization History  Administered Date(s) Administered   PFIZER(Purple Top)SARS-COV-2 Vaccination 12/01/2019, 12/26/2019, 06/29/2020   Tdap 07/19/2013    Conditions to be addressed/monitored:  Hypertension, Hyperlipidemia, and Diabetes  Care Plan : [place]  Updates made by Mayford Knife, [place] since 01/29/2021 12:00 AM     Problem: HTN, HLD, DM II   Priority: High     Long-Range Goal: Disease Management   This Visit's Progress: On track  Note:      Current Barriers:  Unable to independently afford treatment regimen Unable to independently monitor therapeutic efficacy Does not adhere to prescribed medication  regimen  Pharmacist Clinical  Goal(s):  Patient will verbalize ability to afford treatment regimen achieve adherence to monitoring guidelines and medication adherence to achieve therapeutic efficacy through collaboration with PharmD and provider.   Interventions: 1:1 collaboration with Glendale Chard, MD regarding development and update of comprehensive plan of care as evidenced by provider attestation and co-signature Inter-disciplinary care team collaboration (see longitudinal plan of care) Comprehensive medication review performed; medication list updated in electronic medical record  Hypertension (BP goal <130/80) -Controlled -Current treatment: Amlodipine 10 mg tablet once per day Telmisartan-Hydrochlorothiazide 80-12.5 mg tablet once per day -Current home readings: will discuss further during next visit  -Current dietary habits: please see diabetes for more details  -Current exercise habits: will discuss further during next visit -Denies hypotensive/hypertensive symptoms -Educated on Daily salt intake goal < 2300 mg; Exercise goal of 150 minutes per week; Importance of home blood pressure monitoring; Proper BP monitoring technique; -Patient reports that he is not adherent to his BP regimen and that he does not take his medication  -Counseled to monitor BP at home at least twice per week, document, and provide log at future appointments -Will discuss during next office visit. -Recommended to continue current medication  Hyperlipidemia: (LDL goal < 70) -Uncontrolled -Current treatment: Atorvastatin 40 mg tablet once per day.  Patient picked up medication this morning 01/29/2021 -Current dietary patterns: eating fried and fatty foods  -Current exercise habits: patient reports that he does some exercise, he is always active, helping his mother, getting dishes washed, vacuuming and mowing the lawn  -Educated on Cholesterol goals;  Benefits of statin for ASCVD risk reduction; Importance of limiting foods high  in cholesterol; Exercise goal of 150 minutes per week; -Recommended to continue current medication  Diabetes (A1c goal <8%) -Uncontrolled -Current medications: Janumet 50-1000 mg tablet once per day  Patient does not have any medication, will determine plan with Dr. Ned Card 10 mg tablet once per day Through samples by Dr. Baird Cancer (3 boxes)  -Medications previously tried:  -Current home glucose readings fasting glucose: 224 - 01/29/2021 - Patient has not been checking his BS -Denies hypoglycemic/hyperglycemic symptoms -Current meal patterns:  breakfast: oatmeal or cereal - eggs and [place] bacon  lunch: fruit, sandwich, or left over dinner  dinner: sometimes eat out, [place], japanese, egg rolls, sometimes home made food: greens, cabbage, rice, liver and or chicken snacks:potato chips, oatmeal cookies, popcorn and ice cream, peanut butter crackers  drinks: water, ginger ale, juice, fruit punch  Patient reports he drinks 4 bottles of 16 ounces of water   Patient reports that he drinks ginger ale with lunch and dinner  -Current exercise: will discuss in greater detail  -Patient reports that he stopped taking the medication because he did not know if the medication was working -He is ready to be more committed to checking his BS.  -Patient will need patient assistance for [place] on A1c and blood sugar goals; Complications of diabetes including kidney damage, retinal damage, and cardiovascular disease; Exercise goal of 150 minutes per week; Prevention and management of hypoglycemic episodes; Benefits of routine self-monitoring of blood sugar; Carbohydrate counting and/or plate method Counseled to check feet daily and get yearly eye exams -Patient to start checking his BS once a day  -Collaborate with patient to help him with his glucose monitoring technique  -We reviewed the steps in detail -Counseled to check feet daily and get yearly eye exams -Counseled on diet  and exercise extensively Recommended to continue current  medication Collaborated with PCP to determine medication regimen  Collaborate with team to have CPA call patient once a week to find out BS readings    Patient Goals/Self-Care Activities Patient will:  - take medications as prescribed  Follow Up Plan:  Follow Up Appointment Scheduled for: 02/27/2021  The patient has been provided with contact information for the care management team and has been advised to call with any health related questions or concerns.       Medication Assistance: Application for Janumet and Farxiga  medication assistance program. in process.  Anticipated assistance start date 02/2021.  See plan of care for additional detail.  Compliance/Adherence/Medication fill history: Care Gaps: Opthalmology Exam Foot Exam COVID-19 Vaccination   Star-Rating Drugs: Atorvastatin 40 mg  Telmisartan-Hydrochlorothiazide 80-12.5 mg Farxiga 10 mg Janumet 50-1000 mg  Patient's preferred pharmacy is:  [facility] [place] [address] [place] [address] Phone: [phone] Fax: [phone]  Uses pill box? No - Patient is not taking his medication on a regimen Pt endorses 70% compliance  We discussed: Benefits of medication synchronization, packaging and delivery as well as enhanced pharmacist oversight with [employer]. Patient decided to: Continue current medication management strategy  Care Plan and Follow Up Patient Decision:  Patient agrees to Care Plan and Follow-up.  Plan: Telephone follow up appointment with care management team member scheduled for:  02/27/2021 and The patient has been provided with contact information for the care management team and has been advised to call with any health related questions or concerns.   [name], PharmD Clinical Pharmacist [facility] [phone]

## 2021-02-05 NOTE — Chronic Care Management (AMB) (Signed)
    Chronic Care Management Pharmacy Assistant   Name: Warren [name]  MRN: [id number] DOB: Mar 16, 1946  Reason for Encounter: Patient Assistance Coordination- Face to Face Encounter   01/29/2021- Patient arrived to PCP office to sign applications for Janumet with [employer] Patient Assistance Program and [place] with [place] Patient Assistance program. Patient also supplied his income documentation, picture ID and insurance cards. Assisted patient in filling out and signing all patient areas and copies made of documentation. Patient aware applications will be given to PCP, Dr Baird Cancer to sign and will fax when complete.  0/14/1030- Applications received back from Provider signed, Faxed application to [place] for [place]aine Oats, placed application to [employer] for [place] in the mail. Awaiting determination for both.    Medications: Outpatient Encounter Medications as of 01/29/2021  Medication Sig   amLODipine (NORVASC) 10 MG tablet Take 1 tablet (10 mg total) by mouth daily.   atorvastatin (LIPITOR) 40 MG tablet Take 1 tablet (40 mg total) by mouth daily.   Blood Glucose Monitoring Suppl (ONE TOUCH ULTRA 2) w/Device KIT Use as directed to check blood sugars 1 time per day dx: e11.65   dapagliflozin propanediol (FARXIGA) 10 MG TABS tablet Take 10 mg by mouth daily.   glucose blood (ONE TOUCH ULTRA TEST) test strip Use as instructed to check blood sugars 1 time per day dx: e11.65 dx: e11.22   Magnesium 250 MG TABS Take 1 tablet by mouth daily.   sildenafil (REVATIO) 20 MG tablet Take 1 tablet (20 mg total) by mouth as needed. MAX 3 TO 5 TABLETS PER DAY   sitaGLIPtin-metformin (JANUMET) 50-1000 MG tablet Take 1 tablet by mouth daily. (Patient not taking: No sig reported)   telmisartan-hydrochlorothiazide (MICARDIS HCT) 80-12.5 MG tablet Take 1 tablet by mouth daily.   No facility-administered encounter medications on file as of 01/29/2021.   Star Rating Drugs: Telmisartan-HCTZ 80-12.5 mg- Last filled 05-03-2018  [address] [place] Atorvastatin 20 mg- Last filled 01-20-2019 [address] [place] Farxiga 10 mg- No fill history (PAP) Janumet 50-1000 mg- No fill history (PAP)   SIG: Pattricia Boss, [place] Pharmacist Assistant [phone]

## 2021-02-07 DIAGNOSIS — N182 Chronic kidney disease, stage 2 (mild): Secondary | ICD-10-CM

## 2021-02-07 DIAGNOSIS — E7800 Pure hypercholesterolemia, unspecified: Secondary | ICD-10-CM

## 2021-02-07 DIAGNOSIS — E1122 Type 2 diabetes mellitus with diabetic chronic kidney disease: Secondary | ICD-10-CM | POA: Diagnosis not present

## 2021-02-07 DIAGNOSIS — I129 Hypertensive chronic kidney disease with stage 1 through stage 4 chronic kidney disease, or unspecified chronic kidney disease: Secondary | ICD-10-CM

## 2021-02-07 NOTE — Patient Instructions (Signed)
Visit Information It was great speaking with you today!  Please let me know if you have any questions about our visit.   Goals Addressed             This Visit's Progress    Manage My Medicine       Timeframe:  Long-Range Goal Priority:  High Start Date:    01/29/2021                       Expected End Date:                       Follow Up Date 02/27/2021    - call for medicine refill 2 or 3 days before it runs out - call if I am sick and can't take my medicine - keep a list of all the medicines I take; vitamins and herbals too    Why is this important?   These steps will help you keep on track with your medicines.            Patient Care Plan: CCM Pharmacy Care Plan     Problem Identified: HTN, HLD, DM II   Priority: High     Long-Range Goal: Disease Management   This Visit's Progress: On track  Note:      Current Barriers:  Unable to independently afford treatment regimen Unable to independently monitor therapeutic efficacy Does not adhere to prescribed medication regimen  Pharmacist Clinical Goal(s):  Patient will verbalize ability to afford treatment regimen achieve adherence to monitoring guidelines and medication adherence to achieve therapeutic efficacy through collaboration with PharmD and provider.   Interventions: 1:1 collaboration with Dorothyann Peng, MD regarding development and update of comprehensive plan of care as evidenced by provider attestation and co-signature Inter-disciplinary care team collaboration (see longitudinal plan of care) Comprehensive medication review performed; medication list updated in electronic medical record  Hypertension (BP goal <130/80) -Controlled -Current treatment: Amlodipine 10 mg tablet once per day Telmisartan-Hydrochlorothiazide 80-12.5 mg tablet once per day -Current home readings: will discuss further during next visit  -Current dietary habits: please see diabetes for more details  -Current exercise  habits: will discuss further during next visit -Denies hypotensive/hypertensive symptoms -Educated on Daily salt intake goal < 2300 mg; Exercise goal of 150 minutes per week; Importance of home blood pressure monitoring; Proper BP monitoring technique; -Patient reports that he is not adherent to his BP regimen and that he does not take his medication  -Counseled to monitor BP at home at least twice per week, document, and provide log at future appointments -Will discuss during next office visit. -Recommended to continue current medication  Hyperlipidemia: (LDL goal < 70) -Uncontrolled -Current treatment: Atorvastatin 40 mg tablet once per day.  Patient picked up medication this morning 01/29/2021 -Current dietary patterns: eating fried and fatty foods  -Current exercise habits: patient reports that he does some exercise, he is always active, helping his mother, getting dishes washed, vacuuming and mowing the lawn  -Educated on Cholesterol goals;  Benefits of statin for ASCVD risk reduction; Importance of limiting foods high in cholesterol; Exercise goal of 150 minutes per week; -Recommended to continue current medication  Diabetes (A1c goal <8%) -Uncontrolled -Current medications: Janumet 50-1000 mg tablet once per day  Patient does not have any medication, will determine plan with Dr. Audley Hose 10 mg tablet once per day Through samples by Dr. Allyne Gee (3 boxes)  -Medications previously tried:  -Current home  glucose readings fasting glucose: 224 - 01/29/2021 - Patient has not been checking his BS -Denies hypoglycemic/hyperglycemic symptoms -Current meal patterns:  breakfast: oatmeal or cereal - eggs and [place] bacon  lunch: fruit, sandwich, or left over dinner  dinner: sometimes eat out, [place], japanese, egg rolls, sometimes home made food: greens, cabbage, rice, liver and or chicken snacks:potato chips, oatmeal cookies, popcorn and ice cream, peanut butter crackers   drinks: water, ginger ale, juice, fruit punch  Patient reports he drinks 4 bottles of 16 ounces of water   Patient reports that he drinks ginger ale with lunch and dinner  -Current exercise: will discuss in greater detail  -Patient reports that he stopped taking the medication because he did not know if the medication was working -He is ready to be more committed to checking his BS.  -Patient will need patient assistance for Farxiga  -Educated on A1c and blood sugar goals; Complications of diabetes including kidney damage, retinal damage, and cardiovascular disease; Exercise goal of 150 minutes per week; Prevention and management of hypoglycemic episodes; Benefits of routine self-monitoring of blood sugar; Carbohydrate counting and/or plate method Counseled to check feet daily and get yearly eye exams -Patient to start checking his BS once a day  -Collaborate with patient to help him with his glucose monitoring technique  -We reviewed the steps in detail -Counseled to check feet daily and get yearly eye exams -Counseled on diet and exercise extensively Recommended to continue current medication Collaborated with PCP to determine medication regimen  Collaborate with team to have CPA call patient once a week to find out BS readings    Patient Goals/Self-Care Activities Patient will:  - take medications as prescribed  Follow Up Plan:  Follow Up Appointment Scheduled for: 02/27/2021  The patient has been provided with contact information for the care management team and has been advised to call with any health related questions or concerns.       Mr. Holzhauer was given information about Chronic Care Management services today including:  CCM service includes personalized support from designated clinical staff supervised by his physician, including individualized plan of care and coordination with other care providers 24/7 contact phone numbers for assistance for urgent and routine  care needs. Standard insurance, coinsurance, copays and deductibles apply for chronic care management only during months in which we provide at least 20 minutes of these services. Most insurances cover these services at 100%, however patients may be responsible for any copay, coinsurance and/or deductible if applicable. This service may help you avoid the need for more expensive face-to-face services. Only one practitioner may furnish and bill the service in a calendar month. The patient may stop CCM services at any time (effective at the end of the month) by phone call to the office staff.  Patient agreed to services and verbal consent obtained.   The patient verbalized understanding of instructions, educational materials, and care plan provided today and agreed to receive a mailed copy of patient instructions, educational materials, and care plan.   [name], PharmD Clinical Pharmacist [facility] [phone]

## 2021-02-12 ENCOUNTER — Telehealth: Payer: Self-pay

## 2021-02-12 NOTE — Chronic Care Management (AMB) (Signed)
    Chronic Care Management Pharmacy Assistant   Name: Warren [name]  MRN: [id number] DOB: 1946-08-07   Reason for Encounter: Disease State/Diabetes    Recent office visits:  None  Recent consult visits:  None  Hospital visits:  None in previous 6 months  Medications: Outpatient Encounter Medications as of 02/12/2021  Medication Sig   amLODipine (NORVASC) 10 MG tablet Take 1 tablet (10 mg total) by mouth daily.   atorvastatin (LIPITOR) 40 MG tablet Take 1 tablet (40 mg total) by mouth daily.   Blood Glucose Monitoring Suppl (ONE TOUCH ULTRA 2) w/Device KIT Use as directed to check blood sugars 1 time per day dx: e11.65   dapagliflozin propanediol (FARXIGA) 10 MG TABS tablet Take 10 mg by mouth daily.   glucose blood (ONE TOUCH ULTRA TEST) test strip Use as instructed to check blood sugars 1 time per day dx: e11.65 dx: e11.22   Magnesium 250 MG TABS Take 1 tablet by mouth daily.   sildenafil (REVATIO) 20 MG tablet Take 1 tablet (20 mg total) by mouth as needed. MAX 3 TO 5 TABLETS PER DAY   sitaGLIPtin-metformin (JANUMET) 50-1000 MG tablet Take 1 tablet by mouth daily. (Patient not taking: No sig reported)   telmisartan-hydrochlorothiazide (MICARDIS HCT) 80-12.5 MG tablet Take 1 tablet by mouth daily.   No facility-administered encounter medications on file as of 02/12/2021.     Recent Relevant Labs: Lab Results  Component Value Date/Time   HGBA1C 11.5 (H) 12/13/2020 10:05 AM   HGBA1C 7.4 (H) 02/03/2019 05:04 PM   MICROALBUR 30 03/26/2020 03:14 PM   MICROALBUR 10 02/08/2019 03:45 PM    Kidney Function Lab Results  Component Value Date/Time   CREATININE 1.19 01/14/2021 03:57 PM   CREATININE 1.25 12/13/2020 10:05 AM   GFRNONAA 59 (L) 02/03/2019 05:04 PM   GFRAA 68 02/03/2019 05:04 PM    Current antihyperglycemic regimen:  Farxiga 10 mg daily (Samples) Janumet 50-1000 mg (Hasn't started)  What recent interventions/DTPs have been made to improve glycemic control:   Patient to start checking his BS once a day Counseled to check feet daily and get yearly eye exams Counseled on diet and exercise extensively  Have there been any recent hospitalizations or ED visits since last visit with CPP? No  Patient denies hypoglycemic symptoms  Patient denies hyperglycemic symptoms  [name] are you checking your blood sugar? twice daily  What are your blood sugars ranging?  Fasting: 165 Before meals: None After meals: None Bedtime: None  During the week, [name] does your blood glucose drop below 70? Never  Are you checking your feet daily/regularly? Yes  Adherence Review: Is the patient currently on a STATIN medication? Yes Is the patient currently on ACE/ARB medication? Yes Does the patient have >5 day gap between last estimated fill dates? Yes  NOTES: Patient states he was recently approved for farxiga and waiting on determination for Janumet. Contacted [employer] Patient assistance for [employer] and was told application has to be mailed in not faxed. Sent message to Warren [name] PTM informing her. Patient states he is not taking Telmisartan-HCTZ.   Star Rating Drugs: Atorvastatin 40 mg- Last filled 12-27-2020 [facility] 10 mg - Patient Assistance (Recently approved, receiving samples)  Janumet 50-1000 mg -Patient Assistance (Hasn't started)  Warren [name] [facility] Clinical Pharmacist Assistant [phone]

## 2021-02-14 ENCOUNTER — Ambulatory Visit: Payer: Medicare HMO | Admitting: Internal Medicine

## 2021-02-19 ENCOUNTER — Ambulatory Visit (INDEPENDENT_AMBULATORY_CARE_PROVIDER_SITE_OTHER): Payer: Medicare HMO | Admitting: Internal Medicine

## 2021-02-19 ENCOUNTER — Other Ambulatory Visit: Payer: Self-pay

## 2021-02-19 ENCOUNTER — Encounter: Payer: Self-pay | Admitting: Internal Medicine

## 2021-02-19 VITALS — BP 156/80 | HR 63 | Temp 98.3°F | Ht 68.0 in | Wt 203.2 lb

## 2021-02-19 DIAGNOSIS — E1122 Type 2 diabetes mellitus with diabetic chronic kidney disease: Secondary | ICD-10-CM

## 2021-02-19 DIAGNOSIS — I129 Hypertensive chronic kidney disease with stage 1 through stage 4 chronic kidney disease, or unspecified chronic kidney disease: Secondary | ICD-10-CM

## 2021-02-19 DIAGNOSIS — N182 Chronic kidney disease, stage 2 (mild): Secondary | ICD-10-CM | POA: Diagnosis not present

## 2021-02-19 NOTE — Patient Instructions (Signed)

## 2021-02-19 NOTE — Progress Notes (Signed)
I,[name],acting as a Education administrator for Maximino Greenland, MD.,have documented all relevant documentation on the behalf of Maximino Greenland, MD,as directed by  Maximino Greenland, MD while in the presence of Maximino Greenland, MD.  This visit occurred during the SARS-CoV-2 public health emergency.  Safety protocols were in place, including screening questions prior to the visit, additional usage of staff PPE, and extensive cleaning of exam room while observing appropriate contact time as indicated for disinfecting solutions.  Subjective:     Patient ID: Warren [name] , male    DOB: Aug 27, 1945 , 75 y.o.   MRN: [id number]   Chief Complaint  Patient presents with   Diabetes   Hypertension     HPI  The patient is here today for a follow-up on his diabetes.  Patient recently started on Farxiga. He has not had any issues. He was also started on amlodipine - unfortunately, he is not taking this. He is unable to articulate why he did not start the medication.   Diabetes He presents for his follow-up diabetic visit. He has type 2 diabetes mellitus. There are no hypoglycemic associated symptoms. Pertinent negatives for diabetes include no blurred vision and no chest pain. There are no hypoglycemic complications. Diabetic complications include nephropathy. Risk factors for coronary artery disease include diabetes mellitus, dyslipidemia, hypertension, male sex and sedentary lifestyle. He never participates in exercise. An ACE inhibitor/angiotensin II receptor blocker is being taken. He does not see a podiatrist.Eye exam is current.  Hypertension This is a chronic problem. The current episode started more than 1 year ago. The problem has been gradually improving since onset. The problem is controlled. Pertinent negatives include no blurred vision, chest pain, palpitations or shortness of breath. Risk factors for coronary artery disease include diabetes mellitus, dyslipidemia, male gender and sedentary lifestyle.  Past treatments include angiotensin blockers and diuretics. The current treatment provides moderate improvement. Compliance problems include exercise.  Hypertensive end-organ damage includes kidney disease.    Past Medical History:  Diagnosis Date   Chronic kidney disease    stage 2   Diabetes mellitus without complication (HCC)    Hypertension    Malaise and fatigue      Family History  Problem Relation Age of Onset   Hypertension Mother    Hypothyroidism Mother    Alzheimer's disease Father    Prostate cancer Father    Prostate cancer Brother      Current Outpatient Medications:    amLODipine (NORVASC) 10 MG tablet, Take 1 tablet (10 mg total) by mouth daily. (Patient not taking: Reported on 02/27/2021), Disp: 90 tablet, Rfl: 2   atorvastatin (LIPITOR) 40 MG tablet, Take 1 tablet (40 mg total) by mouth daily., Disp: 90 tablet, Rfl: 1   Blood Glucose Monitoring Suppl (ONE TOUCH ULTRA 2) w/Device KIT, Use as directed to check blood sugars 1 time per day dx: e11.65, Disp: 1 kit, Rfl: 1   dapagliflozin propanediol (FARXIGA) 10 MG TABS tablet, Take 10 mg by mouth daily., Disp: , Rfl:    glucose blood (ONE TOUCH ULTRA TEST) test strip, Use as instructed to check blood sugars 1 time per day dx: e11.65 dx: e11.22, Disp: 50 each, Rfl: 11   Magnesium 250 MG TABS, Take 1 tablet by mouth daily., Disp: , Rfl:    sildenafil (REVATIO) 20 MG tablet, Take 1 tablet (20 mg total) by mouth as needed. MAX 3 TO 5 TABLETS PER DAY, Disp: 60 tablet, Rfl: 0   telmisartan-hydrochlorothiazide (MICARDIS HCT) 80-12.5  MG tablet, Take 1 tablet by mouth daily. (Patient not taking: Reported on 02/27/2021), Disp: , Rfl:    No Known Allergies   Review of Systems  Constitutional: Negative.   Eyes:  Negative for blurred vision.  Respiratory: Negative.  Negative for shortness of breath.   Cardiovascular: Negative.  Negative for chest pain and palpitations.  Gastrointestinal: Negative.   Psychiatric/Behavioral:  Negative.    All other systems reviewed and are negative.   Today's Vitals   02/19/21 1002  BP: (!) 156/80  Pulse: 63  Temp: 98.3 F (36.8 C)  TempSrc: Oral  Weight: 203 lb 3.2 oz (92.2 kg)  Height: $Remove'5\' 8"'IEUTpth$  (1.727 m)  PainSc: 0-No pain   Body mass index is 30.9 kg/m.  Wt Readings from Last 3 Encounters:  02/19/21 203 lb 3.2 oz (92.2 kg)  01/14/21 209 lb (94.8 kg)  12/13/20 211 lb (95.7 kg)   .las Objective:  Physical Exam Vitals and nursing note reviewed.  Constitutional:      Appearance: Normal appearance.  HENT:     Head: Normocephalic and atraumatic.     Nose:     Comments: Masked     Mouth/Throat:     Comments: Masked  Cardiovascular:     Rate and Rhythm: Normal rate and regular rhythm.     Heart sounds: Normal heart sounds.  Pulmonary:     Effort: Pulmonary effort is normal.     Breath sounds: Normal breath sounds.  Musculoskeletal:     Cervical back: Normal range of motion.  Skin:    General: Skin is warm.  Neurological:     General: No focal deficit present.     Mental Status: He is alert.  Psychiatric:        Mood and Affect: Mood normal.        Assessment And Plan:     1. Type 2 diabetes mellitus with stage 2 chronic kidney disease, without long-term current use of insulin ([place]) Comments: I will check labs as listed below, including his renal function. Advised to take Farxiga in am and to stay well hydrated. I also brought in CCM Pharmacist, Coralyn Helling so he could meet her in person. He was appreciative of her help in getting his meds through the PA Program. Complications related to long-term uncontrolled DM was discussed with pt in full detail. Importance of dietary compliance was d/w pt.  - Hemoglobin A1c - BMP8+EGFR  2. Hypertensive nephropathy Comments: Uncontrolled, advised to start amlodipine tonight. He will rto in 2 weeks for a nurse visit. I will adjust meds as needed. Advised to follow low sodium diet.     I personally spent 25 minutes  face-to-face and non-face-to-face in the care of this patient, which includes all pre-, intra-, and post visit time on the date of service.  Patient was given opportunity to ask questions. Patient verbalized understanding of the plan and was able to repeat key elements of the plan. All questions were answered to their satisfaction.   I, Maximino Greenland, MD, have reviewed all documentation for this visit. The documentation on 02/19/21 for the exam, diagnosis, procedures, and orders are all accurate and complete.   IF YOU HAVE BEEN REFERRED TO A SPECIALIST, IT MAY TAKE 1-2 WEEKS TO SCHEDULE/PROCESS THE REFERRAL. IF YOU HAVE NOT HEARD FROM US/SPECIALIST IN TWO WEEKS, PLEASE GIVE [place] A CALL AT [phone] X [phone].   THE PATIENT IS ENCOURAGED TO PRACTICE SOCIAL DISTANCING DUE TO THE COVID-19 PANDEMIC.

## 2021-02-19 NOTE — Telephone Encounter (Signed)
The pt was asked how he is feeling and the pt said he is feeling fine.  The pt was asked to confirm the meds he is taking he said he is taking atorvastatin 40 mg, farxga 10mg  daily, and sidenafil 20 mg.

## 2021-02-20 LAB — BMP8+EGFR
BUN/Creatinine Ratio: 11 (ref 10–24)
BUN: 16 mg/dL (ref 8–27)
CO2: 22 mmol/L (ref 20–29)
Calcium: 10.3 mg/dL — ABNORMAL HIGH (ref 8.6–10.2)
Chloride: 105 mmol/L (ref 96–106)
Creatinine, Ser: 1.41 mg/dL — ABNORMAL HIGH (ref 0.76–1.27)
Glucose: 120 mg/dL — ABNORMAL HIGH (ref 65–99)
Potassium: 5 mmol/L (ref 3.5–5.2)
Sodium: 142 mmol/L (ref 134–144)
eGFR: 53 mL/min/{1.73_m2} — ABNORMAL LOW (ref >59)

## 2021-02-20 LAB — HEMOGLOBIN A1C
Est. average glucose Bld gHb Est-mCnc: 220 mg/dL
Hgb A1c MFr Bld: 9.3 % — ABNORMAL HIGH (ref 4.8–5.6)

## 2021-02-26 ENCOUNTER — Telehealth: Payer: Self-pay

## 2021-02-26 NOTE — Chronic Care Management (AMB) (Signed)
  Patient aware of telephone appointment with [name] CPP on 02-27-2021 at 8:00. Patient aware to have/bring all medications, supplements, blood pressure and/or blood sugar logs to visit.  Questions: Have you had any recent office visit or specialist visit outside of [facility]? Patient stated no  Are there any concerns you would like to discuss during your office visit? Patient stated no  Are you having any problems obtaining your medications? (Whether it pharmacy issues or cost) Patient stated no  If patient has any PAP medications ask if they are having any problems getting their PAP medication or refill? Patient stated no  Care Gaps: Yearly ophthalmology exam overdue Foot exam overdue Covid booster overdue  Star Rating Drug: Atorvastatin 40 mg- Last filled 12-27-2020 [facility] Farxiga 10 mg- Patient assistance Januvia- Samples   Any gaps in medications fill history? No  NOTES: Patient stated Dr. Allyne Gee discontinued Janumet due to upset stomach and started Januvia. Unsure about the dose but patient was given samples of Januvia.  Huey Romans [facility] Clinical Pharmacist Assistant [phone]

## 2021-02-27 ENCOUNTER — Ambulatory Visit (INDEPENDENT_AMBULATORY_CARE_PROVIDER_SITE_OTHER): Payer: Medicare HMO

## 2021-02-27 DIAGNOSIS — N182 Chronic kidney disease, stage 2 (mild): Secondary | ICD-10-CM

## 2021-02-27 DIAGNOSIS — E1122 Type 2 diabetes mellitus with diabetic chronic kidney disease: Secondary | ICD-10-CM | POA: Diagnosis not present

## 2021-02-27 DIAGNOSIS — E7800 Pure hypercholesterolemia, unspecified: Secondary | ICD-10-CM | POA: Diagnosis not present

## 2021-02-27 DIAGNOSIS — I129 Hypertensive chronic kidney disease with stage 1 through stage 4 chronic kidney disease, or unspecified chronic kidney disease: Secondary | ICD-10-CM

## 2021-02-27 NOTE — Progress Notes (Signed)
Chronic Care Management Pharmacy Note  03/05/2021 Name:  Warren [name] MRN:  [id number] DOB:  [birth year]  Summary: Patient is adherent to diabetes medication regimen.   Recommendations/Changes made from today's visit: Recommend patient start taking Amlodipine for BP.  Plan: Patient to begin taking Amlodipine and start recording and log BP readings.    Subjective: Warren [name] is an 75 y.o. year old male who is a primary patient of Glendale Chard, MD.  The CCM team was consulted for assistance with disease management and care coordination needs.    Engaged with patient by telephone for follow up visit in response to provider referral for pharmacy case management and/or care coordination services.  He has three daughter and a son who are very close to him. This weekend they had a family reunion and it was a lot of fun. He has a huge family and he met family members who are apart of there family. His mother is the oldest living grandchild of the family tree at 84. He reports that it was educational and fun. He goes to his mothers house everyday and takes care of her and then also takes care of his house. He is going to mow two yards, and a field today. He is making changes to become more healthy and he is noticing the difference.   Consent to Services:  The patient was given the following information about Chronic Care Management services today, agreed to services, and gave verbal consent: 1. CCM service includes personalized support from designated clinical staff supervised by the primary care provider, including individualized plan of care and coordination with other care providers 2. 24/7 contact phone numbers for assistance for urgent and routine care needs. 3. Service will only be billed when office clinical staff spend 20 minutes or more in a month to coordinate care. 4. Only one practitioner may furnish and bill the service in a calendar month. 5.The patient may stop CCM services at any  time (effective at the end of the month) by phone call to the office staff. 6. The patient will be responsible for cost sharing (co-pay) of up to 20% of the service fee (after annual deductible is met). Patient agreed to services and consent obtained.  Patient Care Team: Glendale Chard, MD as PCP - General (Internal Medicine) Mayford Knife, [facility] (Pharmacist)  Recent office visits: 02/19/2021 PCP OV 01/14/2021 PCP OV 12/13/2020 PCP OV  Recent consult visits: None noted   Hospital visits: None in previous 6 months   Objective:  Lab Results  Component Value Date   CREATININE 1.41 (H) 02/19/2021   BUN 16 02/19/2021   GFRNONAA 59 (L) 02/03/2019   GFRAA 68 02/03/2019   NA 142 02/19/2021   K 5.0 02/19/2021   CALCIUM 10.3 (H) 02/19/2021   CO2 22 02/19/2021   GLUCOSE 120 (H) 02/19/2021    Lab Results  Component Value Date/Time   HGBA1C 9.3 (H) 02/19/2021 10:35 AM   HGBA1C 11.5 (H) 12/13/2020 10:05 AM   MICROALBUR 30 03/26/2020 03:14 PM   MICROALBUR 10 02/08/2019 03:45 PM    Last diabetic Eye exam: No results found for: HMDIABEYEEXA  Last diabetic Foot exam: No results found for: HMDIABFOOTEX   Lab Results  Component Value Date   CHOL 278 (H) 12/13/2020   HDL 66 12/13/2020   LDLCALC 179 (H) 12/13/2020   TRIG 182 (H) 12/13/2020   CHOLHDL 4.2 12/13/2020    Hepatic Function Latest Ref Rng & Units 12/13/2020 02/03/2019 10/13/2018  Total Protein  6.0 - 8.5 g/dL 6.8 7.2 7.2  Albumin 3.7 - 4.7 g/dL 4.2 4.5 4.4  AST 0 - 40 IU/L $Remov'15 15 15  'nrgfyg$ ALT 0 - 44 IU/L $Remov'12 18 16  'nKQZZE$ Alk Phosphatase 44 - 121 IU/L 68 59 63  Total Bilirubin 0.0 - 1.2 mg/dL 0.7 0.5 0.8    Lab Results  Component Value Date/Time   TSH 1.60 10/01/2017 12:00 AM    CBC Latest Ref Rng & Units 12/13/2020 10/13/2018  WBC 3.4 - 10.8 x10E3/uL 6.9 8.9  Hemoglobin 13.0 - 17.7 g/dL 15.2 14.6  Hematocrit 37.5 - 51.0 % 43.2 42.6  Platelets 150 - 450 x10E3/uL 186 186    Lab Results  Component Value Date/Time   VD25OH 36.1  02/03/2018 12:00 AM    Clinical ASCVD: No  The 10-year ASCVD risk score Mikey Bussing DC Jr., et al., 2013) is: 47.1%   Values used to calculate the score:     Age: 62 years     Sex: Male     Is Non-Hispanic African American: Yes     Diabetic: Yes     Tobacco smoker: No     Systolic Blood Pressure: 993 mmHg     Is BP treated: Yes     HDL Cholesterol: 66 mg/dL     Total Cholesterol: 278 mg/dL    Depression screen [facility] 2/9 12/13/2020 12/13/2020 07/12/2019  Decreased Interest 0 0 0  Down, Depressed, Hopeless 0 0 0  PHQ - 2 Score 0 0 0  Altered sleeping - - 0  Tired, decreased energy - - 0  Change in appetite - - 0  Feeling bad or failure about yourself  - - 0  Trouble concentrating - - 0  Moving slowly or fidgety/restless - - 0  Suicidal thoughts - - 0  PHQ-9 Score - - 0  Difficult doing work/chores - - Not difficult at all     Social History   Tobacco Use  Smoking Status Never  Smokeless Tobacco Never   BP Readings from Last 3 Encounters:  02/19/21 (!) 156/80  01/14/21 136/80  12/13/20 136/82   Pulse Readings from Last 3 Encounters:  02/19/21 63  01/14/21 64  12/13/20 67   Wt Readings from Last 3 Encounters:  02/19/21 203 lb 3.2 oz (92.2 kg)  01/14/21 209 lb (94.8 kg)  12/13/20 211 lb (95.7 kg)   BMI Readings from Last 3 Encounters:  02/19/21 30.90 kg/m  01/14/21 31.78 kg/m  12/13/20 32.08 kg/m    Assessment/Interventions: Review of patient past medical history, allergies, medications, health status, including review of consultants reports, laboratory and other test data, was performed as part of comprehensive evaluation and provision of chronic care management services.   SDOH:  (Social Determinants of Health) assessments and interventions performed: Yes  SDOH Screenings   Alcohol Screen: Not on file  Depression (PHQ2-9): Low Risk    PHQ-2 Score: 0  Financial Resource Strain: High Risk   Difficulty of Paying Living Expenses: Very hard  Food Insecurity: No Food  Insecurity   Worried About Charity fundraiser in the Last Year: Never true   Ran Out of Food in the Last Year: Never true  Housing: Not on file  Physical Activity: Inactive   Days of Exercise per Week: 0 days   Minutes of Exercise per Session: 0 min  Social Connections: Not on file  Stress: No Stress Concern Present   Feeling of Stress : Not at all  Tobacco Use: Low Risk  Smoking Tobacco Use: Never   Smokeless Tobacco Use: Never  Transportation Needs: No Transportation Needs   Lack of Transportation (Medical): No   Lack of Transportation (Non-Medical): No    CCM Care Plan  No Known Allergies  Medications Reviewed Today     Reviewed by Mayford Knife, [facility] (Pharmacist) on 02/27/21 at 0826  Med List Status: <None>   Medication Order Taking? Sig Documenting Provider Last Dose Status Informant  amLODipine (NORVASC) 10 MG tablet [id number] Yes Take 1 tablet (10 mg total) by mouth daily. Glendale Chard, MD Taking Active   atorvastatin (LIPITOR) 40 MG tablet [id number] Yes Take 1 tablet (40 mg total) by mouth daily. Glendale Chard, MD Taking Active   Blood Glucose Monitoring Suppl (ONE TOUCH ULTRA 2) w/Device KIT [id number] Yes Use as directed to check blood sugars 1 time per day dx: e11.65 Glendale Chard, MD Taking Active   dapagliflozin propanediol (FARXIGA) 10 MG TABS tablet [id number] Yes Take 10 mg by mouth daily. [provider] Taking Active   glucose blood (ONE TOUCH ULTRA TEST) test strip [id number] Yes Use as instructed to check blood sugars 1 time per day dx: e11.65 dx: e11.22 Glendale Chard, MD Taking Active   Magnesium 250 MG TABS [id number] Yes Take 1 tablet by mouth daily. [provider] Taking Active   sildenafil (REVATIO) 20 MG tablet [id number] Yes Take 1 tablet (20 mg total) by mouth as needed. MAX 3 TO 5 TABLETS PER DAY Glendale Chard, MD Taking Active   telmisartan-hydrochlorothiazide (MICARDIS HCT) 80-12.5 MG tablet [id number] Yes Take 1 tablet by  mouth daily. 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    Immunization History  Administered Date(s) Administered   PFIZER(Purple Top)SARS-COV-2 Vaccination 12/01/2019, 12/26/2019, 06/29/2020   Tdap 07/19/2013    Conditions to be addressed/monitored:  Hypertension and Diabetes  Care Plan : [place]  Updates made by Mayford Knife, [place] since 03/05/2021 12:00 AM     Problem: HTN, HLD, DM II   Priority: High     Long-Range Goal: Disease Management   Recent Progress: On track  Priority: High  Note:     Current Barriers:  Unable to independently monitor therapeutic efficacy Unable to self administer medications as prescribed  Pharmacist Clinical Goal(s):  Patient will achieve adherence to monitoring guidelines and medication adherence to achieve therapeutic efficacy through collaboration with PharmD and provider.   Interventions: 1:1 collaboration with Glendale Chard, MD regarding development and update of comprehensive plan of care as evidenced by provider attestation and co-signature Inter-disciplinary care team collaboration (see longitudinal plan of care) Comprehensive medication review performed; medication list updated in electronic medical record  Hypertension (BP goal <130/80) -Uncontrolled -Current treatment: Amlodipine 10 mg tablet once per day -Current home readings: 138/84  -Current dietary habits: He is not eating any foods with  salt -Current exercise habits: please see hyperlipidemia -Reports hypotensive/hypertensive symptoms -Educated on Importance of home blood pressure monitoring; Proper BP monitoring  technique; -Counseled to monitor BP at home at least once per day, document, and provide log at future appointments -Recommended to continue current medication  Hyperlipidemia: (LDL goal < 70) -Uncontrolled -Current treatment: Atorvastatin 40 mg tablet once per day  -Current dietary patterns: He is no longer eating as many fried foods, and he has decreased the amount of bread that he is eating, he is eating more vegetables and fruit -Current exercise habits: he is very active, he does not sit around and just watch TV. He knows that he needs to exercise and he is always moving around.  -Educated on Cholesterol goals;  Benefits of statin for ASCVD risk reduction; Exercise goal of 150 minutes per week; -Counseled on diet and exercise extensively Recommended to continue current medication  Diabetes (A1c goal <7%) -Controlled -Current medications: Farxiga 10 mg tablet once per day Three bottles of medication were delivered this weekend.  Januvia 100 mg tablet once per day.  -Medications previously tried: Janumet - nausea headache   -Current home glucose readings fasting glucose: 117 -123, He notices that his glucose is better since he started taking his medication -Denies hypoglycemic/hyperglycemic symptoms -Current meal patterns:  breakfast: cheerios and fruit with breakfast lunch: no changes  dinner: patient reports no changes to his diet drinks: His current goal is to increase his water intake. He is going to try drink more water and possibly use drink to go packets.  -Current exercise: please see hyperlipidemia -He has increased the amount of water he is doing.  -Educated on A1c and blood sugar goals; Prevention and management of hypoglycemic episodes; -Counseled to check feet daily and get yearly eye  exams -Recommended to continue current medication  Patient Goals/Self-Care Activities Patient will:  - take medications as prescribed  Follow Up Plan: The patient has been provided with contact information for the care management team and has been advised to call with any health related questions or concerns.        Medication Assistance:  Wilder Glade obtained through [place] medication assistance program.  Enrollment ends 07/2021  Compliance/Adherence/Medication fill history: Care Gaps: Opthamology Exam   Star-Rating Drugs: Atorvastatin 40 mg  Telmisartan-Hydrochlorthiazide 80-12.5 mg  Patient's preferred pharmacy is:  [facility], [place] [address] [place] [address] Phone: [phone] Fax: [phone]  Uses pill box? No - will discuss further during the next office visit Pt endorses 70% compliance  We discussed: Benefits of medication synchronization, packaging and delivery as well as enhanced pharmacist oversight with [employer]. Patient decided to: Continue current medication management strategy  Care Plan and Follow Up Patient Decision:  Patient agrees to Care Plan and Follow-up.  Plan: The patient has been provided with contact information for the care management team and has been advised to call with any health related questions or concerns.   [name], PharmD Clinical Pharmacist [facility] [phone]

## 2021-03-05 NOTE — Patient Instructions (Signed)
Visit Information It was great speaking with you today!  Please let me know if you have any questions about our visit.   Goals Addressed             This Visit's Progress    Manage My Medicine       Timeframe:  Long-Range Goal Priority:  High Start Date:    01/29/2021                       Expected End Date:                       Follow Up Date 04/09/2021  In progress:   - call for medicine refill 2 or 3 days before it runs out - call if I am sick and can't take my medicine - keep a list of all the medicines I take; vitamins and herbals too    Why is this important?   These steps will help you keep on track with your medicines.          Patient Care Plan: CCM Pharmacy Care Plan     Problem Identified: HTN, HLD, DM II   Priority: High     Long-Range Goal: Disease Management   Recent Progress: On track  Priority: High  Note:     Current Barriers:  Unable to independently monitor therapeutic efficacy Unable to self administer medications as prescribed  Pharmacist Clinical Goal(s):  Patient will achieve adherence to monitoring guidelines and medication adherence to achieve therapeutic efficacy through collaboration with PharmD and provider.   Interventions: 1:1 collaboration with Dorothyann Peng, MD regarding development and update of comprehensive plan of care as evidenced by provider attestation and co-signature Inter-disciplinary care team collaboration (see longitudinal plan of care) Comprehensive medication review performed; medication list updated in electronic medical record  Hypertension (BP goal <130/80) -Uncontrolled -Current treatment: Amlodipine 10 mg tablet once per day -Current home readings: 138/84  -Current dietary habits: He is not eating any foods with salt -Current exercise habits: please see hyperlipidemia -Reports hypotensive/hypertensive symptoms -Educated on Importance of home blood pressure monitoring; Proper BP monitoring  technique; -Counseled to monitor BP at home at least once per day, document, and provide log at future appointments -Recommended to continue current medication  Hyperlipidemia: (LDL goal < 70) -Uncontrolled -Current treatment: Atorvastatin 40 mg tablet once per day  -Current dietary patterns: He is no longer eating as many fried foods, and he has decreased the amount of bread that he is eating, he is eating more vegetables and fruit -Current exercise habits: he is very active, he does not sit around and just watch TV. He knows that he needs to exercise and he is always moving around.  -Educated on Cholesterol goals;  Benefits of statin for ASCVD risk reduction; Exercise goal of 150 minutes per week; -Counseled on diet and exercise extensively Recommended to continue current medication  Diabetes (A1c goal <7%) -Controlled -Current medications: Farxiga 10 mg tablet once per day Three bottles of medication were delivered this weekend.  Januvia 100 mg tablet once per day.  -Medications previously tried: Janumet - nausea headache   -Current home glucose readings fasting glucose: 117 -123, He notices that his glucose is better since he started taking his medication -Denies hypoglycemic/hyperglycemic symptoms -Current meal patterns:  breakfast: cheerios and fruit with breakfast lunch: no changes  dinner: patient reports no changes to his diet drinks: His current goal is to increase his water intake.  He is going to try drink more water and possibly use drink to go packets.  -Current exercise: please see hyperlipidemia -He has increased the amount of water he is doing.  -Educated on A1c and blood sugar goals; Prevention and management of hypoglycemic episodes; -Counseled to check feet daily and get yearly eye exams -Recommended to continue current medication  Patient Goals/Self-Care Activities Patient will:  - take medications as prescribed  Follow Up Plan: The patient has been  provided with contact information for the care management team and has been advised to call with any health related questions or concerns.        Patient agreed to services and verbal consent obtained.   The patient verbalized understanding of instructions, educational materials, and care plan provided today and agreed to receive a mailed copy of patient instructions, educational materials, and care plan.   [name], PharmD Clinical Pharmacist [facility] [phone]

## 2021-03-10 DIAGNOSIS — E1122 Type 2 diabetes mellitus with diabetic chronic kidney disease: Secondary | ICD-10-CM

## 2021-03-10 DIAGNOSIS — I129 Hypertensive chronic kidney disease with stage 1 through stage 4 chronic kidney disease, or unspecified chronic kidney disease: Secondary | ICD-10-CM

## 2021-03-10 DIAGNOSIS — E7800 Pure hypercholesterolemia, unspecified: Secondary | ICD-10-CM

## 2021-03-10 DIAGNOSIS — N182 Chronic kidney disease, stage 2 (mild): Secondary | ICD-10-CM | POA: Diagnosis not present

## 2021-03-11 ENCOUNTER — Ambulatory Visit: Payer: Medicare HMO | Admitting: Internal Medicine

## 2021-03-14 ENCOUNTER — Ambulatory Visit: Payer: Medicare HMO | Admitting: Internal Medicine

## 2021-03-25 ENCOUNTER — Other Ambulatory Visit: Payer: Self-pay | Admitting: Internal Medicine

## 2021-03-27 ENCOUNTER — Telehealth: Payer: Self-pay

## 2021-03-27 NOTE — Chronic Care Management (AMB) (Signed)
Chronic Care Management Pharmacy Assistant   Name: Warren [name]  MRN: [id number] DOB: 08/24/1945   Reason for Encounter: Disease State/ Diabetes  Recent office visits:  None  Recent consult visits:  None  Hospital visits:  None in previous 6 months  Medications: Outpatient Encounter Medications as of 03/27/2021  Medication Sig   amLODipine (NORVASC) 10 MG tablet Take 1 tablet (10 mg total) by mouth daily. (Patient not taking: Reported on 02/27/2021)   atorvastatin (LIPITOR) 40 MG tablet Take 1 tablet (40 mg total) by mouth daily.   Blood Glucose Monitoring Suppl (ONE TOUCH ULTRA 2) w/Device KIT Use as directed to check blood sugars 1 time per day dx: e11.65   dapagliflozin propanediol (FARXIGA) 10 MG TABS tablet Take 10 mg by mouth daily.   glucose blood (ONE TOUCH ULTRA TEST) test strip Use as instructed to check blood sugars 1 time per day dx: e11.65 dx: e11.22   Magnesium 250 MG TABS Take 1 tablet by mouth daily.   sildenafil (REVATIO) 20 MG tablet Take 1 tablet (20 mg total) by mouth as needed. MAX 3 TO 5 TABLETS PER DAY   telmisartan-hydrochlorothiazide (MICARDIS HCT) 80-12.5 MG tablet Take 1 tablet by mouth daily. (Patient not taking: Reported on 02/27/2021)   No facility-administered encounter medications on file as of 03/27/2021.   Recent Relevant Labs: Lab Results  Component Value Date/Time   HGBA1C 9.3 (H) 02/19/2021 10:35 AM   HGBA1C 11.5 (H) 12/13/2020 10:05 AM   MICROALBUR 30 03/26/2020 03:14 PM   MICROALBUR 10 02/08/2019 03:45 PM    Kidney Function Lab Results  Component Value Date/Time   CREATININE 1.41 (H) 02/19/2021 10:35 AM   CREATININE 1.19 01/14/2021 03:57 PM   GFRNONAA 59 (L) 02/03/2019 05:04 PM   GFRAA 68 02/03/2019 05:04 PM    Current antihyperglycemic regimen:  Farxiga 10 mg daily Januvia 100 mg daily  What recent interventions/DTPs have been made to improve glycemic control:  Educated on A1c and blood sugar goals; Prevention and  management of hypoglycemic episodes Counseled to check feet daily and get yearly eye exams  Have there been any recent hospitalizations or ED visits since last visit with CPP? No  Patient denies hypoglycemic symptoms  Patient denies hyperglycemic symptoms  How often are you checking your blood sugar? twice daily  What are your blood sugars ranging?  Fasting: 139 Before meals: None After meals: None Bedtime: None  During the week, how often does your blood glucose drop below 70? Never  Are you checking your feet daily/regularly? Patient stated daily  Adherence Review: Is the patient currently on a STATIN medication? Yes Is the patient currently on ACE/ARB medication? No Does the patient have >5 day gap between last estimated fill dates? No  NOTES: Rescheduled 04-09-2021 to September per [name] CPP.  Care Gaps: Yearly ophthalmology exam overdue Foot exam overdue Covid booster overdue Medicare wellness 12-19-2021  Star Rating Drugs: Atorvastatin 40 mg- Last filled 03-25-2021 [name] Farxiga 10 mg- Patient assistance Januvia- Samples  Warren [name] CMA Clinical Pharmacist Assistant [phone]  

## 2021-04-09 ENCOUNTER — Telehealth: Payer: Self-pay

## 2021-04-22 ENCOUNTER — Ambulatory Visit (INDEPENDENT_AMBULATORY_CARE_PROVIDER_SITE_OTHER): Payer: Medicare HMO | Admitting: Internal Medicine

## 2021-04-22 ENCOUNTER — Other Ambulatory Visit: Payer: Self-pay

## 2021-04-22 ENCOUNTER — Encounter: Payer: Self-pay | Admitting: Internal Medicine

## 2021-04-22 VITALS — BP 142/82 | HR 55 | Temp 98.5°F

## 2021-04-22 DIAGNOSIS — R059 Cough, unspecified: Secondary | ICD-10-CM | POA: Diagnosis not present

## 2021-04-22 DIAGNOSIS — N182 Chronic kidney disease, stage 2 (mild): Secondary | ICD-10-CM | POA: Diagnosis not present

## 2021-04-22 DIAGNOSIS — I129 Hypertensive chronic kidney disease with stage 1 through stage 4 chronic kidney disease, or unspecified chronic kidney disease: Secondary | ICD-10-CM | POA: Diagnosis not present

## 2021-04-22 LAB — POC COVID19 BINAXNOW: SARS Coronavirus 2 Ag: NEGATIVE

## 2021-04-22 MED ORDER — CETIRIZINE HCL 10 MG PO TABS: 10.0000 mg | ORAL_TABLET | Freq: Every day | ORAL | 2 refills | Status: DC

## 2021-04-22 NOTE — Progress Notes (Signed)
I,Yamilka Roman [employer] as a Education administrator for Maximino Greenland, MD.,have documented all relevant documentation on the behalf of Maximino Greenland, MD,as directed by  Maximino Greenland, MD while in the presence of Maximino Greenland, MD.  This visit occurred during the SARS-CoV-2 public health emergency.  Safety protocols were in place, including screening questions prior to the visit, additional usage of staff PPE, and extensive cleaning of exam room while observing appropriate contact time as indicated for disinfecting solutions.  Subjective:     Patient ID: Warren [name] , male    DOB: [birth year] , 75 y.o.   MRN: [id number]   Chief Complaint  Patient presents with   URI    HPI  He presents today for further evaluation of a cough. He states his sx started Saturday. He denies fever/chills. Thinks he has a "cold".  He has taken a home COVID test which was negative. He is concerned because he is the primary caregiver for his 69+year old mother. He does not want to unknowingly expose her to [place].   URI  This is a new problem. The current episode started in the past 7 days. The problem has been gradually improving. There has been no fever. Associated symptoms include coughing. Pertinent negatives include no congestion or headaches. He has tried nothing for the symptoms.    Past Medical History:  Diagnosis Date   Chronic kidney disease    stage 2   Diabetes mellitus without complication (HCC)    Hypertension    Malaise and fatigue      Family History  Problem Relation Age of Onset   Hypertension Mother    Hypothyroidism Mother    Alzheimer's disease Father    Prostate cancer Father    Prostate cancer Brother      Current Outpatient Medications:    atorvastatin (LIPITOR) 40 MG tablet, Take 1 tablet (40 mg total) by mouth daily., Disp: 90 tablet, Rfl: 1   Blood Glucose Monitoring Suppl (ONE TOUCH ULTRA 2) w/Device KIT, Use as directed to check blood sugars 1 time per day dx: e11.65, Disp: 1  kit, Rfl: 1   cetirizine (ZYRTEC ALLERGY) 10 MG tablet, Take 1 tablet (10 mg total) by mouth daily., Disp: 30 tablet, Rfl: 2   dapagliflozin propanediol (FARXIGA) 10 MG TABS tablet, Take 10 mg by mouth daily., Disp: , Rfl:    glucose blood (ONE TOUCH ULTRA TEST) test strip, Use as instructed to check blood sugars 1 time per day dx: e11.65 dx: e11.22, Disp: 50 each, Rfl: 11   Magnesium 250 MG TABS, Take 1 tablet by mouth daily., Disp: , Rfl:    sildenafil (REVATIO) 20 MG tablet, Take 1 tablet (20 mg total) by mouth as needed. MAX 3 TO 5 TABLETS PER DAY, Disp: 60 tablet, Rfl: 0   amLODipine (NORVASC) 10 MG tablet, Take 1 tablet (10 mg total) by mouth daily. (Patient not taking: No sig reported), Disp: 90 tablet, Rfl: 2   telmisartan-hydrochlorothiazide (MICARDIS HCT) 80-12.5 MG tablet, Take 1 tablet by mouth daily. (Patient not taking: No sig reported), Disp: , Rfl:    No Known Allergies   Review of Systems  Constitutional: Negative.   HENT:  Positive for postnasal drip. Negative for congestion.   Respiratory:  Positive for cough.   Cardiovascular: Negative.   Gastrointestinal: Negative.   Neurological: Negative.  Negative for headaches.  Psychiatric/Behavioral: Negative.      Today's Vitals   04/22/21 1551  BP: (!) 142/82  Pulse: Marland Kitchen)  55  Temp: 98.5 F (36.9 C)  SpO2: 97%   There is no height or weight on file to calculate BMI.   Objective:  Physical Exam Vitals and nursing note reviewed.  Constitutional:      Appearance: Normal appearance.  HENT:     Head: Normocephalic and atraumatic.     Right Ear: Tympanic membrane, ear canal and external ear normal. There is no impacted cerumen.     Left Ear: Tympanic membrane, ear canal and external ear normal. There is no impacted cerumen.     Nose:     Comments: Masked     Mouth/Throat:     Comments: Masked  Eyes:     Extraocular Movements: Extraocular movements intact.  Cardiovascular:     Rate and Rhythm: Normal rate and regular  rhythm.     Heart sounds: Normal heart sounds.  Pulmonary:     Effort: Pulmonary effort is normal.     Breath sounds: Normal breath sounds.  Musculoskeletal:     Cervical back: Normal range of motion.  Skin:    General: Skin is warm.  Neurological:     General: No focal deficit present.     Mental Status: He is alert.  Psychiatric:        Mood and Affect: Mood normal.        Assessment And Plan:     1. Cough Comments: Rapid neg. HE agrees to PCR testing. I will send in rx cetirizine nightly for PND. He is encouraged to mask around others. Advised patient to take Vitamin C, D, Zinc.  Keep yourself hydrated with a lot of water and rest. Take Delsym for cough and Mucinex as need. Take Tylenol or pain reliever every 4-6 hours as needed for pain/fever/body ache. If you have elevated blood pressure, you can take OTC Coricidin-HBP. You can also take OTC Oscillococcinum to help with your symptoms.  Educated patient if symptoms get worse or if he experiences any SOB, chest pain or leg pain to seek immediate emergency care. Continue to monitor your oxygen levels. Call us if you have any questions. Quarantine for 5 days if tested positive and no symptoms or 10 days if tested positive and have symptoms.  - POC COVID-19 - Novel Coronavirus, NAA ([facility])  2. Hypertensive nephropathy Comments: Chronic, fair control. I will not make any med changes today. He is encouraged to avoid OTC decongestants.   3. Chronic kidney disease, stage II (mild) Comments: Chronic, he is encouraged to stay well hydrated.    Patient was given opportunity to ask questions. Patient verbalized understanding of the plan and was able to repeat key elements of the plan. All questions were answered to their satisfaction.   I, Maximino Greenland, MD, have reviewed all documentation for this visit. The documentation on 04/22/21 for the exam, diagnosis, procedures, and orders are all accurate and complete.   IF YOU HAVE BEEN  REFERRED TO A SPECIALIST, IT MAY TAKE 1-2 WEEKS TO SCHEDULE/PROCESS THE REFERRAL. IF YOU HAVE NOT HEARD FROM US/SPECIALIST IN TWO WEEKS, PLEASE GIVE [place] A CALL AT [phone] X [phone].   THE PATIENT IS ENCOURAGED TO PRACTICE SOCIAL DISTANCING DUE TO THE COVID-19 PANDEMIC.

## 2021-04-22 NOTE — Patient Instructions (Signed)

## 2021-04-23 ENCOUNTER — Ambulatory Visit: Payer: Medicare HMO | Admitting: Internal Medicine

## 2021-04-23 LAB — NOVEL CORONAVIRUS, NAA: SARS-CoV-2, NAA: NOT DETECTED

## 2021-04-23 LAB — SARS-COV-2, NAA 2 DAY TAT

## 2021-05-01 ENCOUNTER — Telehealth: Payer: Self-pay

## 2021-05-01 NOTE — Chronic Care Management (AMB) (Signed)
  Camdin Hegner was reminded to have all medications, supplements and any blood glucose and blood pressure readings available for review with [name], Pharm. D, at his telephone visit on 05-02-2021 at 9:30.   Questions: Have you had any recent office visit or specialist visit outside of [facility]? Patient stated no  Are there any concerns you would like to discuss during your office visit? Patient stated no  Are you having any problems obtaining your medications? (Whether it pharmacy issues or cost) Patient stated no  If patient has any PAP medications ask if they are having any problems getting their PAP medication or refill? Patient stated he needs to drop off patient assistance form for Januvia because he was side tracked with other things going on. Patient states he hasn't been taking the samples daily and may need more samples until form gets dropped off.  Care Gaps: Yearly ophthalmology exam overdue Foot exam overdue Covid booster overdue Medicare wellness 12-19-2021  Star Rating Drug: Atorvastatin 40 mg- Last filled 03-25-2021 90 DS [facility] Farxiga 10 mg- Patient assistance Januvia- Samples  Any gaps in medications fill history? No  Huey Romans [facility] Clinical Pharmacist Assistant [phone]

## 2021-05-02 ENCOUNTER — Ambulatory Visit (INDEPENDENT_AMBULATORY_CARE_PROVIDER_SITE_OTHER): Payer: Medicare HMO

## 2021-05-02 DIAGNOSIS — I129 Hypertensive chronic kidney disease with stage 1 through stage 4 chronic kidney disease, or unspecified chronic kidney disease: Secondary | ICD-10-CM

## 2021-05-02 DIAGNOSIS — E1122 Type 2 diabetes mellitus with diabetic chronic kidney disease: Secondary | ICD-10-CM

## 2021-05-02 NOTE — Progress Notes (Signed)
Chronic Care Management Pharmacy Note  05/08/2021 Name:  Warren [name] MRN:  [id number] DOB:  Jun 16, 1946  Summary: Patient reports that he had a great time for his birthday and he has not turned in his patient assistance paperwork.   Recommendations/Changes made from today's visit: Recommend patient start taking Amlodipine 10 mg tablet once per day.  Recommended patient receive COVID-19 Booster vaccine.   - Confirmed patient received COVID -19 Booster vaccine on 05/03/2021   Plan: Patient to start taking Amlodipine 10 mg tablet once per day and continue to log his BP readings.  Patient agreed to have me schedule COVID-19 booster vaccine.   Subjective: Warren [name] is an 75 y.o. year old male who is a primary patient of Glendale Chard, MD.  The CCM team was consulted for assistance with disease management and care coordination needs.    Engaged with patient by telephone for follow up visit in response to provider referral for pharmacy case management and/or care coordination services.   Consent to Services:  The patient was given information about Chronic Care Management services, agreed to services, and gave verbal consent prior to initiation of services.  Please see initial visit note for detailed documentation.   Patient Care Team: Glendale Chard, MD as PCP - General (Internal Medicine) Mayford Knife, [facility] (Pharmacist)  Recent office visits: 04/22/2021 PCP OV 02/19/2021 PCP OV  Recent consult visits: None noted in the past 6 months   Hospital visits: None in previous 6 months   Objective:  Lab Results  Component Value Date   CREATININE 1.41 (H) 02/19/2021   BUN 16 02/19/2021   GFRNONAA 59 (L) 02/03/2019   GFRAA 68 02/03/2019   NA 142 02/19/2021   K 5.0 02/19/2021   CALCIUM 10.3 (H) 02/19/2021   CO2 22 02/19/2021   GLUCOSE 120 (H) 02/19/2021    Lab Results  Component Value Date/Time   HGBA1C 9.3 (H) 02/19/2021 10:35 AM   HGBA1C 11.5 (H) 12/13/2020 10:05 AM    MICROALBUR 30 03/26/2020 03:14 PM   MICROALBUR 10 02/08/2019 03:45 PM    Last diabetic Eye exam: No results found for: HMDIABEYEEXA  Last diabetic Foot exam: No results found for: HMDIABFOOTEX   Lab Results  Component Value Date   CHOL 278 (H) 12/13/2020   HDL 66 12/13/2020   LDLCALC 179 (H) 12/13/2020   TRIG 182 (H) 12/13/2020   CHOLHDL 4.2 12/13/2020    Hepatic Function Latest Ref Rng & Units 12/13/2020 02/03/2019 10/13/2018  Total Protein 6.0 - 8.5 g/dL 6.8 7.2 7.2  Albumin 3.7 - 4.7 g/dL 4.2 4.5 4.4  AST 0 - 40 IU/L $Remov'15 15 15  'veUIxA$ ALT 0 - 44 IU/L $Remov'12 18 16  'ThZQCB$ Alk Phosphatase 44 - 121 IU/L 68 59 63  Total Bilirubin 0.0 - 1.2 mg/dL 0.7 0.5 0.8    Lab Results  Component Value Date/Time   TSH 1.60 10/01/2017 12:00 AM    CBC Latest Ref Rng & Units 12/13/2020 10/13/2018  WBC 3.4 - 10.8 x10E3/uL 6.9 8.9  Hemoglobin 13.0 - 17.7 g/dL 15.2 14.6  Hematocrit 37.5 - 51.0 % 43.2 42.6  Platelets 150 - 450 x10E3/uL 186 186    Lab Results  Component Value Date/Time   VD25OH 36.1 02/03/2018 12:00 AM    Clinical ASCVD: No  The 10-year ASCVD risk score (Arnett DK, et al., 2019) is: 42.4%   Values used to calculate the score:     Age: 75 years     Sex: Male  Is Non-Hispanic African American: Yes     Diabetic: Yes     Tobacco smoker: No     Systolic Blood Pressure: 741 mmHg     Is BP treated: Yes     HDL Cholesterol: 66 mg/dL     Total Cholesterol: 278 mg/dL    Depression screen [facility] 2/9 12/13/2020 12/13/2020 07/12/2019  Decreased Interest 0 0 0  Down, Depressed, Hopeless 0 0 0  PHQ - 2 Score 0 0 0  Altered sleeping - - 0  Tired, decreased energy - - 0  Change in appetite - - 0  Feeling bad or failure about yourself  - - 0  Trouble concentrating - - 0  Moving slowly or fidgety/restless - - 0  Suicidal thoughts - - 0  PHQ-9 Score - - 0  Difficult doing work/chores - - Not difficult at all     Social History   Tobacco Use  Smoking Status Never  Smokeless Tobacco Never   BP  Readings from Last 3 Encounters:  04/22/21 (!) 142/82  02/19/21 (!) 156/80  01/14/21 136/80   Pulse Readings from Last 3 Encounters:  04/22/21 (!) 55  02/19/21 63  01/14/21 64   Wt Readings from Last 3 Encounters:  02/19/21 203 lb 3.2 oz (92.2 kg)  01/14/21 209 lb (94.8 kg)  12/13/20 211 lb (95.7 kg)   BMI Readings from Last 3 Encounters:  02/19/21 30.90 kg/m  01/14/21 31.78 kg/m  12/13/20 32.08 kg/m    Assessment/Interventions: Review of patient past medical history, allergies, medications, health status, including review of consultants reports, laboratory and other test data, was performed as part of comprehensive evaluation and provision of chronic care management services.   SDOH:  (Social Determinants of Health) assessments and interventions performed: No  SDOH Screenings   Alcohol Screen: Not on file  Depression (PHQ2-9): Low Risk    PHQ-2 Score: 0  Financial Resource Strain: High Risk   Difficulty of Paying Living Expenses: Very hard  Food Insecurity: No Food Insecurity   Worried About Charity fundraiser in the Last Year: Never true   Ran Out of Food in the Last Year: Never true  Housing: Not on file  Physical Activity: Inactive   Days of Exercise per Week: 0 days   Minutes of Exercise per Session: 0 min  Social Connections: Not on file  Stress: No Stress Concern Present   Feeling of Stress : Not at all  Tobacco Use: Low Risk    Smoking Tobacco Use: Never   Smokeless Tobacco Use: Never  Transportation Needs: No Transportation Needs   Lack of Transportation (Medical): No   Lack of Transportation (Non-Medical): No    CCM Care Plan  No Known Allergies  Medications Reviewed Today     Reviewed by Glendale Chard, MD (Physician) on 04/22/21 at [place] List Status: <None>   Medication Order Taking? Sig Documenting Provider Last Dose Status Informant  amLODipine (NORVASC) 10 MG tablet [id number] No Take 1 tablet (10 mg total) by mouth daily.  Patient not  taking: No sig reported   Glendale Chard, MD Not Taking Active   atorvastatin (LIPITOR) 40 MG tablet [id number] Yes Take 1 tablet (40 mg total) by mouth daily. Glendale Chard, MD Taking Active   Blood Glucose Monitoring Suppl (ONE TOUCH ULTRA 2) w/Device KIT [id number] Yes Use as directed to check blood sugars 1 time per day dx: e11.65 Glendale Chard, MD Taking Active   dapagliflozin propanediol (FARXIGA) 10 MG TABS tablet  [id number] Yes Take 10 mg by mouth daily. [provider] Taking Active   glucose blood (ONE TOUCH ULTRA TEST) test strip [id number] Yes Use as instructed to check blood sugars 1 time per day dx: e11.65 dx: e11.22 Glendale Chard, MD Taking Active   Magnesium 250 MG TABS [id number] Yes Take 1 tablet by mouth daily. [provider] Taking Active   sildenafil (REVATIO) 20 MG tablet [id number] Yes Take 1 tablet (20 mg total) by mouth as needed. MAX 3 TO 5 TABLETS PER DAY Glendale Chard, MD Taking Active   telmisartan-hydrochlorothiazide (MICARDIS HCT) 80-12.5 MG tablet [id number] No Take 1 tablet by mouth daily.  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    Immunization History  Administered Date(s) Administered   PFIZER(Purple Top)SARS-COV-2 Vaccination 12/01/2019, 12/26/2019, 06/29/2020   Tdap 07/19/2013    Conditions to be addressed/monitored:  Hypertension and Diabetes  Care Plan : [place]   Updates made by Mayford Knife, [place] since 05/08/2021 12:00 AM     Problem: HTN, HLD, DM II   Priority: High     Long-Range Goal: Disease Management   Recent Progress: On track  Priority: High  Note:   Current Barriers:  Unable to independently monitor therapeutic efficacy  Pharmacist Clinical Goal(s):  Patient will achieve adherence to monitoring guidelines and medication adherence to achieve therapeutic efficacy through collaboration with PharmD and provider.   Interventions: 1:1 collaboration with Glendale Chard, MD regarding development and update of comprehensive plan of care as evidenced by provider attestation and co-signature Inter-disciplinary care team collaboration (see longitudinal plan of care) Comprehensive medication review performed; medication list updated in electronic medical record  Hypertension (BP goal <130/80) -Controlled -Current treatment: Amlodipine 10 mg tablet once per day  Telmisartan-Hydrcholorothiazide 80-12.5 mg taking 1 tablet by mouth daily  Patient reports at this time he is not taking this medication  -Medications previously tried: none noted  -Current home readings: 140/84, 142/82 -Current dietary habits: patient is eating plenty of vegetables  -Current exercise habits: patient is very active taking care of his mother  -Denies hypotensive/hypertensive symptoms -Patient is open to starting  -Educated on BP goals and benefits of medications for prevention of heart attack, stroke and kidney damage; Importance of home blood pressure monitoring; -Counseled to monitor BP at home once per day, document, and provide log at future appointments -Recommended to continue current medication  Diabetes (A1c goal <7%) -Uncontrolled -Current medications: Farxiga 10 mg tablet once per day  One bottle left - he will start taking  Januvia 100 mg tablet once per day  Patient reports he has not gotten any more samples  He has the patient assistance  application, and he is going to bring it to Dr. Baird Cancer office, to continue the program.  He is going to drop byr the office on Monday.  -Current home glucose readings fasting glucose: 162,183,178,144, 118, 126, 124,  -  Denies hypoglycemic/hyperglycemic symptoms -Current meal patterns: he enjoyed is birthday but is now back on track breakfast: cheerios and fruit with breakfast lunch: no changes  dinner: patient reports no changes to his diet drinks: He has increased the amount of water he is drinking  -Current exercise: he takes care of his mother and stays very active -Educated on A1c and blood sugar goals; Complications of diabetes including kidney damage, retinal damage, and cardiovascular disease; -Counseled to check feet daily and get yearly eye exams -Recommended to continue current medication  Patient Goals/Self-Care Activities Patient will:  - take medications as prescribed  Patient Goals/Self-Care Activities Patient will:  - take medications as prescribed  Follow Up Plan: The patient has been provided with contact information for the care management team and has been advised to call with any health related questions or concerns.        Medication Assistance: Application for [place]  medication assistance program. in process.  Anticipated assistance start date 05/2021.  See plan of care for additional detail.  Compliance/Adherence/Medication fill history: Care Gaps: Shingrix Vaccine Ophthalmology Exam Foot Exam COVID-19 Vaccine Booster Influenza Vaccine  Star-Rating Drugs: Atorvastatin 40 mg tablet Telmisartan - Hydrochlorothiazide 80-12.5 mg tablet  Farxiga 10 mg tablet   Patient's preferred pharmacy is:  [facility], [place] [address] [place] [address] Phone: [phone] Fax: [phone]  Uses pill box? No - patient has medication organized in bottles  Pt endorses 80% compliance  We discussed:  Benefits of medication synchronization, packaging and delivery as well as enhanced pharmacist oversight with [employer]. Patient decided to: Continue current medication management strategy  Care Plan and Follow Up Patient Decision:  Patient agrees to Care Plan and Follow-up.  Plan: The patient has been provided with contact information for the care management team and has been advised to call with any health related questions or concerns.   [name], PharmD Clinical Pharmacist [facility] [phone]

## 2021-05-08 ENCOUNTER — Other Ambulatory Visit: Payer: Self-pay | Admitting: Internal Medicine

## 2021-05-08 MED ORDER — AMLODIPINE BESYLATE 5 MG PO TABS: 5.0000 mg | ORAL_TABLET | Freq: Every day | ORAL | 11 refills | Status: DC

## 2021-05-08 NOTE — Patient Instructions (Signed)
Visit Information It was great speaking with you today!  Please let me know if you have any questions about our visit.   Goals Addressed             This Visit's Progress    Manage My Medicine       Timeframe:  Long-Range Goal Priority:  High             Expected End Date:                       Follow Up Date 06/04/2021  In progress:   - call for medicine refill 2 or 3 days before it runs out - call if I am sick and can't take my medicine - keep a list of all the medicines I take; vitamins and herbals too    Why is this important?   These steps will help you keep on track with your medicines.          Patient Care Plan: CCM Pharmacy Care Plan     Problem Identified: HTN, HLD, DM II   Priority: High     Long-Range Goal: Disease Management   Recent Progress: On track  Priority: High  Note:   Current Barriers:  Unable to independently monitor therapeutic efficacy  Pharmacist Clinical Goal(s):  Patient will achieve adherence to monitoring guidelines and medication adherence to achieve therapeutic efficacy through collaboration with PharmD and provider.   Interventions: 1:1 collaboration with Dorothyann Peng, MD regarding development and update of comprehensive plan of care as evidenced by provider attestation and co-signature Inter-disciplinary care team collaboration (see longitudinal plan of care) Comprehensive medication review performed; medication list updated in electronic medical record  Hypertension (BP goal <130/80) -Controlled -Current treatment: Amlodipine 10 mg tablet once per day  Telmisartan-Hydrcholorothiazide 80-12.5 mg taking 1 tablet by mouth daily  Patient reports at this time he is not taking this medication  -Medications previously tried: none noted  -Current home readings: 140/84, 142/82 -Current dietary habits: patient is eating plenty of vegetables  -Current exercise habits: patient is very active taking care of his mother  -Denies  hypotensive/hypertensive symptoms -Patient is open to starting  -Educated on BP goals and benefits of medications for prevention of heart attack, stroke and kidney damage; Importance of home blood pressure monitoring; -Counseled to monitor BP at home once per day, document, and provide log at future appointments -Recommended to continue current medication  Diabetes (A1c goal <7%) -Uncontrolled -Current medications: Farxiga 10 mg tablet once per day  One bottle left - he will start taking  Januvia 100 mg tablet once per day  Patient reports he has not gotten any more samples  He has the patient assistance application, and he is going to bring it to Dr. Allyne Gee office, to continue the program.  He is going to drop byr the office on Monday.  -Current home glucose readings fasting glucose: 162,183,178,144, 118, 126, 124,  -Denies hypoglycemic/hyperglycemic symptoms -Current meal patterns: he enjoyed is birthday but is now back on track breakfast: cheerios and fruit with breakfast lunch: no changes  dinner: patient reports no changes to his diet drinks: He has increased the amount of water he is drinking  -Current exercise: he takes care of his mother and stays very active -Educated on A1c and blood sugar goals; Complications of diabetes including kidney damage, retinal damage, and cardiovascular disease; -Counseled to check feet daily and get yearly eye exams -Recommended to continue current medication  Patient  Goals/Self-Care Activities Patient will:  - take medications as prescribed  Patient Goals/Self-Care Activities Patient will:  - take medications as prescribed  Follow Up Plan: The patient has been provided with contact information for the care management team and has been advised to call with any health related questions or concerns.        Patient agreed to services and verbal consent obtained.   The patient verbalized understanding of instructions, educational  materials, and care plan provided today and agreed to receive a mailed copy of patient instructions, educational materials, and care plan.   [name], PharmD Clinical Pharmacist [facility] [phone]

## 2021-05-10 DIAGNOSIS — I129 Hypertensive chronic kidney disease with stage 1 through stage 4 chronic kidney disease, or unspecified chronic kidney disease: Secondary | ICD-10-CM | POA: Diagnosis not present

## 2021-05-10 DIAGNOSIS — E1122 Type 2 diabetes mellitus with diabetic chronic kidney disease: Secondary | ICD-10-CM | POA: Diagnosis not present

## 2021-05-10 DIAGNOSIS — N182 Chronic kidney disease, stage 2 (mild): Secondary | ICD-10-CM

## 2021-05-22 ENCOUNTER — Telehealth: Payer: Self-pay

## 2021-05-22 NOTE — Chronic Care Management (AMB) (Addendum)
    Chronic Care Management Pharmacy Assistant   Name: Dixie Jafri  MRN: [id number] DOB: 1946/03/16   Reason for Encounter: Patient assistance coordination     Medications: Outpatient Encounter Medications as of 05/22/2021  Medication Sig   amLODipine (NORVASC) 5 MG tablet Take 1 tablet (5 mg total) by mouth daily.   atorvastatin (LIPITOR) 40 MG tablet Take 1 tablet (40 mg total) by mouth daily.   Blood Glucose Monitoring Suppl (ONE TOUCH ULTRA 2) w/Device KIT Use as directed to check blood sugars 1 time per day dx: e11.65   cetirizine (ZYRTEC ALLERGY) 10 MG tablet Take 1 tablet (10 mg total) by mouth daily.   dapagliflozin propanediol (FARXIGA) 10 MG TABS tablet Take 10 mg by mouth daily.   glucose blood (ONE TOUCH ULTRA TEST) test strip Use as instructed to check blood sugars 1 time per day dx: e11.65 dx: e11.22   Magnesium 250 MG TABS Take 1 tablet by mouth daily. (Patient not taking: Reported on 05/02/2021)   sildenafil (REVATIO) 20 MG tablet Take 1 tablet (20 mg total) by mouth as needed. MAX 3 TO 5 TABLETS PER DAY   telmisartan-hydrochlorothiazide (MICARDIS HCT) 80-12.5 MG tablet Take 1 tablet by mouth daily.   No facility-administered encounter medications on file as of 05/22/2021.   05-22-2021: Patient stated he never received attestation form from [place] for [facility] and he is aware he needs to bring application for Januvia. Updated Pattricia Boss.  [place] Pharmacist Assistant [phone]

## 2021-05-29 ENCOUNTER — Ambulatory Visit: Payer: Medicare HMO | Admitting: Internal Medicine

## 2021-06-03 ENCOUNTER — Encounter: Payer: Self-pay | Admitting: Internal Medicine

## 2021-06-03 ENCOUNTER — Other Ambulatory Visit: Payer: Self-pay

## 2021-06-03 ENCOUNTER — Ambulatory Visit (INDEPENDENT_AMBULATORY_CARE_PROVIDER_SITE_OTHER): Payer: Medicare HMO | Admitting: Internal Medicine

## 2021-06-03 VITALS — BP 168/84 | HR 60 | Temp 98.4°F | Ht 68.0 in | Wt 203.4 lb

## 2021-06-03 DIAGNOSIS — E6609 Other obesity due to excess calories: Secondary | ICD-10-CM | POA: Diagnosis not present

## 2021-06-03 DIAGNOSIS — E1122 Type 2 diabetes mellitus with diabetic chronic kidney disease: Secondary | ICD-10-CM | POA: Diagnosis not present

## 2021-06-03 DIAGNOSIS — N182 Chronic kidney disease, stage 2 (mild): Secondary | ICD-10-CM

## 2021-06-03 DIAGNOSIS — Z6830 Body mass index (BMI) 30.0-30.9, adult: Secondary | ICD-10-CM

## 2021-06-03 DIAGNOSIS — I129 Hypertensive chronic kidney disease with stage 1 through stage 4 chronic kidney disease, or unspecified chronic kidney disease: Secondary | ICD-10-CM | POA: Diagnosis not present

## 2021-06-03 LAB — CMP14+EGFR
ALT: 18 IU/L (ref 0–44)
AST: 18 IU/L (ref 0–40)
Albumin/Globulin Ratio: 1.4 (ref 1.2–2.2)
Albumin: 4.2 g/dL (ref 3.7–4.7)
Alkaline Phosphatase: 68 IU/L (ref 44–121)
BUN/Creatinine Ratio: 13 (ref 10–24)
BUN: 18 mg/dL (ref 8–27)
Bilirubin Total: 0.7 mg/dL (ref 0.0–1.2)
CO2: 23 mmol/L (ref 20–29)
Calcium: 9.9 mg/dL (ref 8.6–10.2)
Chloride: 105 mmol/L (ref 96–106)
Creatinine, Ser: 1.35 mg/dL — ABNORMAL HIGH (ref 0.76–1.27)
Globulin, Total: 3 g/dL (ref 1.5–4.5)
Glucose: 186 mg/dL — ABNORMAL HIGH (ref 70–99)
Potassium: 5.3 mmol/L — ABNORMAL HIGH (ref 3.5–5.2)
Sodium: 141 mmol/L (ref 134–144)
Total Protein: 7.2 g/dL (ref 6.0–8.5)
eGFR: 55 mL/min/{1.73_m2} — ABNORMAL LOW (ref >59)

## 2021-06-03 LAB — HEMOGLOBIN A1C
Est. average glucose Bld gHb Est-mCnc: 214 mg/dL
Hgb A1c MFr Bld: 9.1 % — ABNORMAL HIGH (ref 4.8–5.6)

## 2021-06-03 MED ORDER — SITAGLIPTIN PHOSPHATE 100 MG PO TABS: 100.0000 mg | ORAL_TABLET | Freq: Every day | ORAL | 1 refills | Status: DC

## 2021-06-03 MED ORDER — AMLODIPINE BESYLATE 5 MG PO TABS: 5.0000 mg | ORAL_TABLET | Freq: Every day | ORAL | 1 refills | Status: DC

## 2021-06-03 NOTE — Progress Notes (Signed)
I,[name],acting as a Education administrator for Maximino Greenland, MD.,have documented all relevant documentation on the behalf of Maximino Greenland, MD,as directed by  Maximino Greenland, MD while in the presence of Maximino Greenland, MD.  This visit occurred during the SARS-CoV-2 public health emergency.  Safety protocols were in place, including screening questions prior to the visit, additional usage of staff PPE, and extensive cleaning of exam room while observing appropriate contact time as indicated for disinfecting solutions.  Subjective:     Patient ID: Warren [name] , male    DOB: February 03, 1946 , 75 y.o.   MRN: [id number]   Chief Complaint  Patient presents with   Diabetes   Hypertension    HPI  The patient is here today for a follow-up on his diabetes/htn. Unfortunately, he has yet to start amlodipine. States pharmacy never received the rx. He admits he did not call the office to notify anyone.   Diabetes He presents for his follow-up diabetic visit. He has type 2 diabetes mellitus. There are no hypoglycemic associated symptoms. Pertinent negatives for diabetes include no blurred vision and no chest pain. There are no hypoglycemic complications. Diabetic complications include nephropathy. Risk factors for coronary artery disease include diabetes mellitus, dyslipidemia, hypertension, male sex and sedentary lifestyle. He never participates in exercise. An ACE inhibitor/angiotensin II receptor blocker is being taken. He does not see a podiatrist.Eye exam is current.  Hypertension This is a chronic problem. The current episode started more than 1 year ago. The problem has been gradually improving since onset. The problem is controlled. Pertinent negatives include no blurred vision, chest pain, palpitations or shortness of breath. Risk factors for coronary artery disease include diabetes mellitus, dyslipidemia, male gender and sedentary lifestyle. Past treatments include angiotensin blockers and diuretics.  The current treatment provides moderate improvement. Compliance problems include exercise.  Hypertensive end-organ damage includes kidney disease.    Past Medical History:  Diagnosis Date   Chronic kidney disease    stage 2   Diabetes mellitus without complication (HCC)    Hypertension    Malaise and fatigue      Family History  Problem Relation Age of Onset   Hypertension Mother    Hypothyroidism Mother    Alzheimer's disease Father    Prostate cancer Father    Prostate cancer Brother      Current Outpatient Medications:    atorvastatin (LIPITOR) 40 MG tablet, Take 1 tablet (40 mg total) by mouth daily., Disp: 90 tablet, Rfl: 1   Blood Glucose Monitoring Suppl (ONE TOUCH ULTRA 2) w/Device KIT, Use as directed to check blood sugars 1 time per day dx: e11.65, Disp: 1 kit, Rfl: 1   dapagliflozin propanediol (FARXIGA) 10 MG TABS tablet, Take 10 mg by mouth daily., Disp: , Rfl:    sildenafil (REVATIO) 20 MG tablet, Take 1 tablet (20 mg total) by mouth as needed. MAX 3 TO 5 TABLETS PER DAY, Disp: 60 tablet, Rfl: 0   sitaGLIPtin (JANUVIA) 100 MG tablet, Take 1 tablet (100 mg total) by mouth daily., Disp: 90 tablet, Rfl: 1   amLODipine (NORVASC) 5 MG tablet, Take 1 tablet (5 mg total) by mouth daily., Disp: 90 tablet, Rfl: 1   cetirizine (ZYRTEC ALLERGY) 10 MG tablet, Take 1 tablet (10 mg total) by mouth daily. (Patient not taking: Reported on 06/03/2021), Disp: 30 tablet, Rfl: 2   glucose blood (ONE TOUCH ULTRA TEST) test strip, Use as instructed to check blood sugars 1 time per day dx: e11.65  dx: e11.22, Disp: 50 each, Rfl: 11   Magnesium 250 MG TABS, Take 1 tablet by mouth daily. (Patient not taking: No sig reported), Disp: , Rfl:    telmisartan-hydrochlorothiazide (MICARDIS HCT) 80-12.5 MG tablet, Take 1 tablet by mouth daily. (Patient not taking: Reported on 06/03/2021), Disp: , Rfl:    No Known Allergies   Review of Systems  Constitutional: Negative.   Eyes:  Negative for blurred  vision.  Respiratory: Negative.  Negative for shortness of breath.   Cardiovascular: Negative.  Negative for chest pain and palpitations.  Gastrointestinal: Negative.   Psychiatric/Behavioral: Negative.    All other systems reviewed and are negative.   Today's Vitals   06/03/21 0910  BP: (!) 168/84  Pulse: 60  Temp: 98.4 F (36.9 C)  Weight: 203 lb 6.4 oz (92.3 kg)  Height: $Remove'5\' 8"'DRHKcqi$  (1.727 m)  PainSc: 0-No pain   Body mass index is 30.93 kg/m.  Wt Readings from Last 3 Encounters:  06/03/21 203 lb 6.4 oz (92.3 kg)  02/19/21 203 lb 3.2 oz (92.2 kg)  01/14/21 209 lb (94.8 kg)    BP Readings from Last 3 Encounters:  06/03/21 (!) 168/84  04/22/21 (!) 142/82  02/19/21 (!) 156/80    Objective:  Physical Exam Vitals and nursing note reviewed.  Constitutional:      Appearance: Normal appearance.  HENT:     Head: Normocephalic and atraumatic.     Nose:     Comments: Masked     Mouth/Throat:     Comments: Masked  Eyes:     Extraocular Movements: Extraocular movements intact.  Cardiovascular:     Rate and Rhythm: Normal rate and regular rhythm.     Heart sounds: Normal heart sounds.  Pulmonary:     Effort: Pulmonary effort is normal.     Breath sounds: Normal breath sounds.  Musculoskeletal:     Cervical back: Normal range of motion.  Skin:    General: Skin is warm.  Neurological:     General: No focal deficit present.     Mental Status: He is alert.  Psychiatric:        Mood and Affect: Mood normal.        Assessment And Plan:     1. Type 2 diabetes mellitus with stage 2 chronic kidney disease, without long-term current use of insulin (HCC) Comments: Chronic, I will check labs as listed below. Importance of dietary and medication compliance was discussed with the patient. - Hemoglobin A1c - CMP14+EGFR  2. Hypertensive nephropathy Comments: Uncontrolled. Has yet to start amlodipine. A new rx was sent to his pharmacy, encouraged to start immediately. He will f/u in  3 wks for nurse visit.   3. Class 1 obesity due to excess calories with serious comorbidity and body mass index (BMI) of 30.0 to 30.9 in adult Comments: His BMI is acceptable for his demographic. He is encouraged to aim for at least 150 minutes of exercise per week.    Patient was given opportunity to ask questions. Patient verbalized understanding of the plan and was able to repeat key elements of the plan. All questions were answered to their satisfaction.   I, Maximino Greenland, MD, have reviewed all documentation for this visit. The documentation on 06/03/21 for the exam, diagnosis, procedures, and orders are all accurate and complete.   IF YOU HAVE BEEN REFERRED TO A SPECIALIST, IT MAY TAKE 1-2 WEEKS TO SCHEDULE/PROCESS THE REFERRAL. IF YOU HAVE NOT HEARD FROM US/SPECIALIST IN TWO WEEKS,  PLEASE GIVE [place] A CALL AT [phone] X [phone].   THE PATIENT IS ENCOURAGED TO PRACTICE SOCIAL DISTANCING DUE TO THE COVID-19 PANDEMIC.

## 2021-06-03 NOTE — Patient Instructions (Signed)

## 2021-06-04 ENCOUNTER — Ambulatory Visit (INDEPENDENT_AMBULATORY_CARE_PROVIDER_SITE_OTHER): Payer: Medicare HMO

## 2021-06-04 DIAGNOSIS — Z6830 Body mass index (BMI) 30.0-30.9, adult: Secondary | ICD-10-CM | POA: Insufficient documentation

## 2021-06-04 DIAGNOSIS — E6609 Other obesity due to excess calories: Secondary | ICD-10-CM | POA: Insufficient documentation

## 2021-06-04 NOTE — Patient Instructions (Addendum)
Visit Information It was great speaking with you today!  Please let me know if you have any questions about our visit.   Goals Addressed             This Visit's Progress    Manage My Medicine       Timeframe:  Long-Range Goal Priority:  High             Expected End Date:                       Follow Up Date 06/27/2021  In progress: - call for medicine refill 2 or 3 days before it runs out - call if I am sick and can't take my medicine - keep a list of all the medicines I take; vitamins and herbals too    Why is this important?   These steps will help you keep on track with your medicines.          Patient Care Plan: CCM Pharmacy Care Plan     Problem Identified: HTN, DM II   Priority: High     Long-Range Goal: Disease Management   This Visit's Progress: On track  Recent Progress: On track  Priority: High  Note:   Current Barriers:  Unable to independently afford treatment regimen Unable to independently monitor therapeutic efficacy  Pharmacist Clinical Goal(s):  Patient will achieve adherence to monitoring guidelines and medication adherence to achieve therapeutic efficacy through collaboration with PharmD and provider.   Interventions: 1:1 collaboration with Dorothyann Peng, MD regarding development and update of comprehensive plan of care as evidenced by provider attestation and co-signature Inter-disciplinary care team collaboration (see longitudinal plan of care) Comprehensive medication review performed; medication list updated in electronic medical record  Hypertension (BP goal <130/80) -Uncontrolled -Current treatment: Amlodipine 5 mg tablet- take one daily  -Current home readings:  -Current dietary habits: patient reports that he loves salt, he reports it being his achilles heel. He is working on using less salt -Denies hypotensive/hypertensive symptoms -He reports checking his BP all the time, but it has never been "normal"  -Educated on BP  goals and benefits of medications for prevention of heart attack, stroke and kidney damage; Daily salt intake goal < 2300 mg; Importance of home blood pressure monitoring; Proper BP monitoring technique; -Patient reports [facility] never filled prescription for for Amlodipine 5 mg tablet because they said they did not receive it.  -Counseled to monitor BP at home at least once per day, document, and provide log at future appointments -His goal is to cut out salt and he is going to try salt free options.  -Recommended to continue current medication  Diabetes (A1c goal <8%) -Uncontrolled -Current medications: Januvia 100 mg tablet once per day.  He received samples from PCP team on yesterday Patient was out of Januvia 100 mg for 2-3 weeks  Farxiga 10 mg tablet - taking 1 tablet daily Patient automatically re-enrolled in patient assistance program for 2023 -Current home glucose readings fasting glucose: 153  post prandial glucose: 173 -Denies hypoglycemic/hyperglycemic symptoms -Patient reports that he ran out of medicine.  -Current meal patterns: there have been no changes from his current  -Educated on A1c and blood sugar goals; -Counseled to check feet daily and get yearly eye exams -I contacted the [facility] program who let me know the patient attestation form needs to be sent to via mail. The representative reports the form was sent to Mr. Ponds on 02/15/2021. They  are going to determine whether the form can be sent to the office versus the patients home. Per Karena Addison, the representative the patient should receive medication within 7-10 business days from this approval.  I explained this information to the patient and he voiced understanding.  -Counseled on diet and exercise extensively  Patient Goals/Self-Care Activities Patient will:  - take medications as prescribed  Follow Up Plan: The patient has been provided with contact information for the care management team and has been  advised to call with any health related questions or concerns.       Patient agreed to services and verbal consent obtained.   The patient verbalized understanding of instructions, educational materials, and care plan provided today and agreed to receive a mailed copy of patient instructions, educational materials, and care plan.   [name], PharmD Clinical Pharmacist [facility] [phone]

## 2021-06-04 NOTE — Progress Notes (Signed)
Chronic Care Management Pharmacy Note  06/04/2021 Name:  Warren [name] MRN:  [id number] DOB:  1946-08-11  Summary: Patient reports that he was out of medication fo  Recommendations/Changes made from today's visit: Recommend patient contact us if he every needs more medication or has questions. Recommended check to see if he still has the attestation form that we can utilize it to be sent in to the patient.   Plan: I contacted the [facility] program who let me know the patient attestation form needs to be sent to via mail. The representative reports the form was sent to [name] on 02/15/2021. They are going to determine whether the form can be sent to the office versus the patients home. Per Murray Hodgkins, the representative the patient should receive medication within 7-10 business days from this approval.  I explained this information to the patient and he voiced understanding.   Subjective: Warren [name] is an 75 y.o. year old male who is a primary patient of Warren Chard, MD.  The CCM team was consulted for assistance with disease management and care coordination needs.    Engaged with patient by telephone for follow up visit in response to provider referral for pharmacy case management and/or care coordination services.   Consent to Services:  The patient was given information about Chronic Care Management services, agreed to services, and gave verbal consent prior to initiation of services.  Please see initial visit note for detailed documentation.   Patient Care Team: Warren Chard, MD as PCP - General (Internal Medicine) Mayford Knife, [facility] (Pharmacist)  Recent office visits: 06/03/2021 PCP OV 04/22/2021 PCP [facility] visits: None in previous 6 months   Objective:  Lab Results  Component Value Date   CREATININE 1.35 (H) 06/03/2021   BUN 18 06/03/2021   GFRNONAA 59 (L) 02/03/2019   GFRAA 68 02/03/2019   NA 141 06/03/2021   K 5.3 (H) 06/03/2021   CALCIUM 9.9  06/03/2021   CO2 23 06/03/2021   GLUCOSE 186 (H) 06/03/2021    Lab Results  Component Value Date/Time   HGBA1C 9.1 (H) 06/03/2021 10:09 AM   HGBA1C 9.3 (H) 02/19/2021 10:35 AM   MICROALBUR 30 03/26/2020 03:14 PM   MICROALBUR 10 02/08/2019 03:45 PM    Last diabetic Eye exam: No results found for: HMDIABEYEEXA  Last diabetic Foot exam: No results found for: HMDIABFOOTEX   Lab Results  Component Value Date   CHOL 278 (H) 12/13/2020   HDL 66 12/13/2020   LDLCALC 179 (H) 12/13/2020   TRIG 182 (H) 12/13/2020   CHOLHDL 4.2 12/13/2020    Hepatic Function Latest Ref Rng & Units 06/03/2021 12/13/2020 02/03/2019  Total Protein 6.0 - 8.5 g/dL 7.2 6.8 7.2  Albumin 3.7 - 4.7 g/dL 4.2 4.2 4.5  AST 0 - 40 IU/L $Remov'18 15 15  'WiIPDI$ ALT 0 - 44 IU/L $Remov'18 12 18  'cMggJh$ Alk Phosphatase 44 - 121 IU/L 68 68 59  Total Bilirubin 0.0 - 1.2 mg/dL 0.7 0.7 0.5    Lab Results  Component Value Date/Time   TSH 1.60 10/01/2017 12:00 AM    CBC Latest Ref Rng & Units 12/13/2020 10/13/2018  WBC 3.4 - 10.8 x10E3/uL 6.9 8.9  Hemoglobin 13.0 - 17.7 g/dL 15.2 14.6  Hematocrit 37.5 - 51.0 % 43.2 42.6  Platelets 150 - 450 x10E3/uL 186 186    Lab Results  Component Value Date/Time   VD25OH 36.1 02/03/2018 12:00 AM    Clinical ASCVD: No  The 10-year ASCVD  risk score (Arnett DK, et al., 2019) is: 53.3%   Values used to calculate the score:     Age: 75 years     Sex: Male     Is Non-Hispanic African American: Yes     Diabetic: Yes     Tobacco smoker: No     Systolic Blood Pressure: 327 mmHg     Is BP treated: Yes     HDL Cholesterol: 66 mg/dL     Total Cholesterol: 278 mg/dL    Depression screen [facility] 2/9 12/13/2020 12/13/2020 07/12/2019  Decreased Interest 0 0 0  Down, Depressed, Hopeless 0 0 0  PHQ - 2 Score 0 0 0  Altered sleeping - - 0  Tired, decreased energy - - 0  Change in appetite - - 0  Feeling bad or failure about yourself  - - 0  Trouble concentrating - - 0  Moving slowly or fidgety/restless - - 0  Suicidal  thoughts - - 0  PHQ-9 Score - - 0  Difficult doing work/chores - - Not difficult at all     Social History   Tobacco Use  Smoking Status Never  Smokeless Tobacco Never   BP Readings from Last 3 Encounters:  06/03/21 (!) 168/84  04/22/21 (!) 142/82  02/19/21 (!) 156/80   Pulse Readings from Last 3 Encounters:  06/03/21 60  04/22/21 (!) 55  02/19/21 63   Wt Readings from Last 3 Encounters:  06/03/21 203 lb 6.4 oz (92.3 kg)  02/19/21 203 lb 3.2 oz (92.2 kg)  01/14/21 209 lb (94.8 kg)   BMI Readings from Last 3 Encounters:  06/03/21 30.93 kg/m  02/19/21 30.90 kg/m  01/14/21 31.78 kg/m    Assessment/Interventions: Review of patient past medical history, allergies, medications, health status, including review of consultants reports, laboratory and other test data, was performed as part of comprehensive evaluation and provision of chronic care management services.   SDOH:  (Social Determinants of Health) assessments and interventions performed: No  SDOH Screenings   Alcohol Screen: Not on file  Depression (PHQ2-9): Low Risk    PHQ-2 Score: 0  Financial Resource Strain: High Risk   Difficulty of Paying Living Expenses: Very hard  Food Insecurity: No Food Insecurity   Worried About Charity fundraiser in the Last Year: Never true   Ran Out of Food in the Last Year: Never true  Housing: Not on file  Physical Activity: Inactive   Days of Exercise per Week: 0 days   Minutes of Exercise per Session: 0 min  Social Connections: Not on file  Stress: No Stress Concern Present   Feeling of Stress : Not at all  Tobacco Use: Low Risk    Smoking Tobacco Use: Never   Smokeless Tobacco Use: Never   Passive Exposure: Not on file  Transportation Needs: No Transportation Needs   Lack of Transportation (Medical): No   Lack of Transportation (Non-Medical): No    CCM Care Plan  No Known Allergies  Medications Reviewed Today     Reviewed by Mayford Knife, [facility] (Pharmacist)  on 06/04/21 at 0926  Med List Status: <None>   Medication Order Taking? Sig Documenting Provider Last Dose Status Informant  amLODipine (NORVASC) 5 MG tablet [id number]  Take 1 tablet (5 mg total) by mouth daily. Warren Chard, MD  Active   atorvastatin (LIPITOR) 40 MG tablet [id number] No Take 1 tablet (40 mg total) by mouth daily. Warren Chard, MD Taking Active   Blood Glucose Monitoring Suppl (  ONE TOUCH ULTRA 2) w/Device KIT [id number] No Use as directed to check blood sugars 1 time per day dx: e11.65 Warren Chard, MD Taking Active   cetirizine (ZYRTEC ALLERGY) 10 MG tablet [id number] No Take 1 tablet (10 mg total) by mouth daily.  Patient not taking: Reported on 06/03/2021   Warren Chard, MD Not Taking Active   dapagliflozin propanediol (FARXIGA) 10 MG TABS tablet [id number] No Take 10 mg by mouth daily. [provider] Taking Active   glucose blood (ONE TOUCH ULTRA TEST) test strip [id number] No Use as instructed to check blood sugars 1 time per day dx: e11.65 dx: e11.22 Warren Chard, MD Taking Active   Magnesium 250 MG TABS [id number] No Take 1 tablet by mouth daily.  Patient not taking: No sig reported   [provider] Not Taking Active   sildenafil (REVATIO) 20 MG tablet [id number] No Take 1 tablet (20 mg total) by mouth as needed. MAX 3 TO 5 TABLETS PER DAY Warren Chard, MD Taking Active   sitaGLIPtin (JANUVIA) 100 MG tablet [id number]  Take 1 tablet (100 mg total) by mouth daily. Warren Chard, MD  Active   telmisartan-hydrochlorothiazide (MICARDIS HCT) 80-12.5 MG tablet [id number] No Take 1 tablet by mouth daily.  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    Immunization History  Administered Date(s) Administered   PFIZER(Purple Top)SARS-COV-2 Vaccination 12/01/2019, 12/26/2019, 06/29/2020   Tdap 07/19/2013    Conditions to be addressed/monitored:  Hypertension and Diabetes  Care Plan : [place]  Updates made by Mayford Knife, [place] since 06/04/2021 12:00 AM     Problem: HTN, DM II   Priority: High     Long-Range Goal: Disease Management   This Visit's Progress: On track  Recent Progress: On track  Priority: High  Note:   Current Barriers:  Unable to independently afford treatment regimen Unable to independently monitor therapeutic efficacy  Pharmacist Clinical Goal(s):  Patient will achieve adherence to monitoring guidelines and medication adherence to achieve therapeutic efficacy through collaboration with PharmD and provider.   Interventions: 1:1 collaboration with Warren Chard, MD regarding development and update of comprehensive plan of care as evidenced by provider attestation and co-signature Inter-disciplinary care team collaboration (see longitudinal plan of care) Comprehensive medication review performed; medication list updated in electronic medical record  Hypertension (BP goal <130/80) -Uncontrolled -Current treatment: Amlodipine 5 mg tablet- take one daily  -Current home readings:  -Current dietary habits: patient reports that he loves salt, he reports it being his achilles heel. He is working on using less salt -Denies hypotensive/hypertensive symptoms -He reports checking his BP all the time, but it has never been "normal"  -Educated on BP goals and benefits of medications for prevention of heart attack, stroke and kidney  damage; Daily salt intake goal < 2300 mg; Importance of home blood pressure monitoring; Proper BP monitoring technique; -Patient reports [employer]  never filled prescription for for Amlodipine 5 mg tablet because they said they did not receive it.  -Counseled to monitor BP at home at least once per day, document, and provide log at future appointments -His goal is to cut out salt and he is going to try salt free options.  -Recommended to continue current medication  Diabetes (A1c goal <8%) -Uncontrolled -Current medications: Januvia 100 mg tablet once per day.  He received samples from PCP team on yesterday Patient was out of Januvia 100 mg for 2-3 weeks  Farxiga 10 mg tablet - taking 1 tablet daily Patient automatically re-enrolled in patient assistance program for 2023 -Current home glucose readings fasting glucose: 153  post prandial glucose: 173 -Denies hypoglycemic/hyperglycemic symptoms -Patient reports that he ran out of medicine.  -Current meal patterns: there have been no changes from his current  -Educated on A1c and blood sugar goals; -Counseled to check feet daily and get yearly eye exams -Counseled on diet and exercise extensively  Patient Goals/Self-Care Activities Patient will:  - take medications as prescribed  Follow Up Plan: The patient has been provided with contact information for the care management team and has been advised to call with any health related questions or concerns.       Medication Assistance:  Wilder Glade obtained through [employer] medication assistance program.  Enrollment ends 07/2021.  Application for Januvia pending through [employer] expected start date: 06/2021.   Compliance/Adherence/Medication fill history: Care Gaps: Opthamology Exam Foot Exam COVID-19 Vaccine - 05/03/2021 - [facility], Moderna Bivalent   Star-Rating Drugs: Atorvastatin 40 mg Farxiga 10 mg  Januvia 100 mg  Telmisartan-Hydrochlorothiazide 80-12.5  mg  Patient's preferred pharmacy is:  [facility], [place] [address] [place] [address] Phone: [phone] Fax: [phone]  Uses pill box? Yes Pt endorses 85% compliance  We discussed: Benefits of medication synchronization, packaging and delivery as well as enhanced pharmacist oversight with [employer]. Patient decided to: Continue current medication management strategy  Care Plan and Follow Up Patient Decision:  Patient agrees to Care Plan and Follow-up.  Plan: The patient has been provided with contact information for the care management team and has been advised to call with any health related questions or concerns.   [name], PharmD Clinical Pharmacist [facility] [phone]

## 2021-06-10 DIAGNOSIS — N182 Chronic kidney disease, stage 2 (mild): Secondary | ICD-10-CM | POA: Diagnosis not present

## 2021-06-10 DIAGNOSIS — E1122 Type 2 diabetes mellitus with diabetic chronic kidney disease: Secondary | ICD-10-CM | POA: Diagnosis not present

## 2021-06-10 DIAGNOSIS — I129 Hypertensive chronic kidney disease with stage 1 through stage 4 chronic kidney disease, or unspecified chronic kidney disease: Secondary | ICD-10-CM

## 2021-06-13 ENCOUNTER — Telehealth: Payer: Self-pay

## 2021-06-13 NOTE — Telephone Encounter (Signed)
  Chronic Care Management   Pharmacist Patient Assistance Follow Up Note   06/13/2021 Name: Warren [name] MRN: [id number] DOB: [birth year]  Referred by: Dorothyann Peng, MD Reason for referral : CCM services   CARE PLAN ENTRY (see longitudinal plan of care for additional care plan information)  Current Barriers:  Financial Barriers: patient has Medicare  insurance and reports copay for Farxiga & [name] is cost prohibitive at this time Over 100 dollars for Farxiga: in a 30 day supply  Over 300 dollars for Janumet: in a 30 day supply   Pharmacist Clinical Goal(s):  Over the next 30 days, patient will work with PharmD and providers to relieve medication access concerns  Interventions: Comprehensive medication review completed; medication list updated in electronic medical record.  Inter-disciplinary care team collaboration (see longitudinal plan of care) Patient is automatically enrolled for next year Farxiga patient assistance program Patient has eligible prescription for Farxiga, that has now been placed on auto-refill, is it a 90 day supply with three more refills - he will receive in 7-10 business days Patient assistance representative is going to send PAP refill request reform to [facility] so that we will have readily available copy, [employer] [phone] is number for verbal refill We will proactively send over a new prescription in January to cover [name] for next year, since there is not a requirement for paperwork to be completed again.  Patients attestation form signed today for [employer]  Patient Self Care Activities:  Patient will provide necessary portions of application   Please see past updates related to this goal by clicking on the "Past Updates" button in the selected goal   [name], PharmD Clinical Pharmacist [facility] [phone]

## 2021-06-18 ENCOUNTER — Other Ambulatory Visit: Payer: Self-pay

## 2021-06-18 ENCOUNTER — Ambulatory Visit: Payer: Medicare HMO

## 2021-06-18 VITALS — BP 122/74 | HR 61 | Temp 98.1°F | Ht 68.0 in | Wt 200.8 lb

## 2021-06-18 DIAGNOSIS — I129 Hypertensive chronic kidney disease with stage 1 through stage 4 chronic kidney disease, or unspecified chronic kidney disease: Secondary | ICD-10-CM

## 2021-06-18 NOTE — Progress Notes (Signed)
The pt was instructed to continue with his current medications.

## 2021-06-18 NOTE — Patient Instructions (Signed)

## 2021-06-26 ENCOUNTER — Telehealth: Payer: Self-pay

## 2021-06-26 ENCOUNTER — Other Ambulatory Visit: Payer: Self-pay

## 2021-06-26 MED ORDER — DAPAGLIFLOZIN PROPANEDIOL 10 MG PO TABS: 10.0000 mg | ORAL_TABLET | Freq: Every day | ORAL | 3 refills | Status: DC

## 2021-06-26 NOTE — Chronic Care Management (AMB) (Signed)
    Chronic Care Management Pharmacy Assistant   Name: Warren [name]  MRN: [id number] DOB: 1946-05-08   Reason for Encounter: Reschedule appointment    Medications: Outpatient Encounter Medications as of 06/26/2021  Medication Sig   amLODipine (NORVASC) 5 MG tablet Take 1 tablet (5 mg total) by mouth daily.   atorvastatin (LIPITOR) 40 MG tablet Take 1 tablet (40 mg total) by mouth daily.   Blood Glucose Monitoring Suppl (ONE TOUCH ULTRA 2) w/Device KIT Use as directed to check blood sugars 1 time per day dx: e11.65   cetirizine (ZYRTEC ALLERGY) 10 MG tablet Take 1 tablet (10 mg total) by mouth daily.   dapagliflozin propanediol (FARXIGA) 10 MG TABS tablet Take 10 mg by mouth daily.   glucose blood (ONE TOUCH ULTRA TEST) test strip Use as instructed to check blood sugars 1 time per day dx: e11.65 dx: e11.22   Magnesium 250 MG TABS Take 1 tablet by mouth daily.   sildenafil (REVATIO) 20 MG tablet Take 1 tablet (20 mg total) by mouth as needed. MAX 3 TO 5 TABLETS PER DAY   sitaGLIPtin (JANUVIA) 100 MG tablet Take 1 tablet (100 mg total) by mouth daily.   telmisartan-hydrochlorothiazide (MICARDIS HCT) 80-12.5 MG tablet Take 1 tablet by mouth daily.   No facility-administered encounter medications on file as of 06/26/2021.   06-26-2021: Rescheduled patient's appointment to January since he just had an appointment in October. Patient stated he received Janumet but wasn't sure why because he is taking Januvia. Application was processed for Janumet instead of Januvia. Patient states he has a month supply of samples for Januvia. Contacted [employer] patient assistance and was told patient would need to submit a new application for Januvia. Initiated application and Once application is received, an attestation form will be mailed to sign.  [place] Pharmacist Assistant [phone]

## 2021-06-27 ENCOUNTER — Telehealth: Payer: Medicare HMO

## 2021-07-10 ENCOUNTER — Ambulatory Visit (INDEPENDENT_AMBULATORY_CARE_PROVIDER_SITE_OTHER): Payer: Medicare HMO | Admitting: Internal Medicine

## 2021-07-10 ENCOUNTER — Encounter: Payer: Self-pay | Admitting: Internal Medicine

## 2021-07-10 ENCOUNTER — Other Ambulatory Visit: Payer: Self-pay

## 2021-07-10 VITALS — BP 134/72 | HR 65 | Temp 98.5°F | Ht 67.6 in | Wt 203.6 lb

## 2021-07-10 DIAGNOSIS — E6609 Other obesity due to excess calories: Secondary | ICD-10-CM

## 2021-07-10 DIAGNOSIS — Z6831 Body mass index (BMI) 31.0-31.9, adult: Secondary | ICD-10-CM

## 2021-07-10 DIAGNOSIS — Z0000 Encounter for general adult medical examination without abnormal findings: Secondary | ICD-10-CM

## 2021-07-10 DIAGNOSIS — N182 Chronic kidney disease, stage 2 (mild): Secondary | ICD-10-CM

## 2021-07-10 DIAGNOSIS — E1122 Type 2 diabetes mellitus with diabetic chronic kidney disease: Secondary | ICD-10-CM | POA: Diagnosis not present

## 2021-07-10 DIAGNOSIS — E7800 Pure hypercholesterolemia, unspecified: Secondary | ICD-10-CM | POA: Diagnosis not present

## 2021-07-10 DIAGNOSIS — I129 Hypertensive chronic kidney disease with stage 1 through stage 4 chronic kidney disease, or unspecified chronic kidney disease: Secondary | ICD-10-CM | POA: Diagnosis not present

## 2021-07-10 LAB — POCT UA - MICROALBUMIN
Albumin/Creatinine Ratio, Urine, POC: 30
Creatinine, POC: 100 mg/dL
Microalbumin Ur, POC: 10 mg/L

## 2021-07-10 LAB — POCT URINALYSIS DIPSTICK
Bilirubin, UA: NEGATIVE
Blood, UA: NEGATIVE
Glucose, UA: POSITIVE — AB
Ketones, UA: NEGATIVE
Leukocytes, UA: NEGATIVE
Nitrite, UA: NEGATIVE
Protein, UA: NEGATIVE
Spec Grav, UA: 1.01 (ref 1.010–1.025)
Urobilinogen, UA: 0.2 E.U./dL
pH, UA: 5 (ref 5.0–8.0)

## 2021-07-10 LAB — POC HEMOCCULT BLD/STL (OFFICE/1-CARD/DIAGNOSTIC)
Card #1 Date: 11302022
Fecal Occult Blood, POC: NEGATIVE

## 2021-07-10 NOTE — Patient Instructions (Signed)

## 2021-07-10 NOTE — Progress Notes (Signed)
I,Tianna Badgett,acting as a Education administrator for Maximino Greenland, MD.,have documented all relevant documentation on the behalf of Maximino Greenland, MD,as directed by  Maximino Greenland, MD while in the presence of Maximino Greenland, MD.  This visit occurred during the SARS-CoV-2 public health emergency.  Safety protocols were in place, including screening questions prior to the visit, additional usage of staff PPE, and extensive cleaning of exam room while observing appropriate contact time as indicated for disinfecting solutions.  Subjective:     Patient ID: Warren [name] , male    DOB: 1946-05-18 , 75 y.o.   MRN: [id number]   Chief Complaint  Patient presents with   Annual Exam   Diabetes   Hypertension    HPI  The patient is here today for a physical exam.  The patient reports compliance with meds. He denies having headaches, chest pain and shortness of breath.   Diabetes He presents for his follow-up diabetic visit. He has type 2 diabetes mellitus. There are no hypoglycemic associated symptoms. Pertinent negatives for diabetes include no blurred vision and no chest pain. There are no hypoglycemic complications. Diabetic complications include nephropathy. Risk factors for coronary artery disease include diabetes mellitus, dyslipidemia, hypertension, male sex and sedentary lifestyle. He never participates in exercise. An ACE inhibitor/angiotensin II receptor blocker is being taken. He does not see a podiatrist.Eye exam is current.  Hypertension This is a chronic problem. The current episode started more than 1 year ago. The problem has been gradually improving since onset. The problem is controlled. Pertinent negatives include no blurred vision, chest pain, palpitations or shortness of breath. Risk factors for coronary artery disease include diabetes mellitus, dyslipidemia, male gender and sedentary lifestyle. Past treatments include angiotensin blockers and diuretics. The current treatment provides  moderate improvement. Compliance problems include exercise.  Hypertensive end-organ damage includes kidney disease.    Past Medical History:  Diagnosis Date   Chronic kidney disease    stage 2   Diabetes mellitus without complication (HCC)    Hypertension    Malaise and fatigue      Family History  Problem Relation Age of Onset   Hypertension Mother    Hypothyroidism Mother    Alzheimer's disease Father    Prostate cancer Father    Prostate cancer Brother      Current Outpatient Medications:    amLODipine (NORVASC) 5 MG tablet, Take 1 tablet (5 mg total) by mouth daily., Disp: 90 tablet, Rfl: 1   atorvastatin (LIPITOR) 40 MG tablet, Take 1 tablet (40 mg total) by mouth daily., Disp: 90 tablet, Rfl: 1   Blood Glucose Monitoring Suppl (ONE TOUCH ULTRA 2) w/Device KIT, Use as directed to check blood sugars 1 time per day dx: e11.65, Disp: 1 kit, Rfl: 1   dapagliflozin propanediol (FARXIGA) 10 MG TABS tablet, Take 1 tablet (10 mg total) by mouth daily., Disp: 90 tablet, Rfl: 3   glucose blood (ONE TOUCH ULTRA TEST) test strip, Use as instructed to check blood sugars 1 time per day dx: e11.65 dx: e11.22, Disp: 50 each, Rfl: 11   sildenafil (REVATIO) 20 MG tablet, Take 1 tablet (20 mg total) by mouth as needed. MAX 3 TO 5 TABLETS PER DAY, Disp: 60 tablet, Rfl: 0   sitaGLIPtin (JANUVIA) 100 MG tablet, Take 1 tablet (100 mg total) by mouth daily., Disp: 90 tablet, Rfl: 1   telmisartan-hydrochlorothiazide (MICARDIS HCT) 80-12.5 MG tablet, Take 1 tablet by mouth daily., Disp: , Rfl:    No Known  Allergies   Men's preventive visit. Patient Health Questionnaire (PHQ-2) is  Flowsheet Row Clinical Support from 12/13/2020 in [facility]  PHQ-2 Total Score 0     . Patient is on a "healthy" diet. Marital status: Single. Relevant history for alcohol use is:  Social History   Substance and Sexual Activity  Alcohol Use Yes   Alcohol/week: 3.0 standard drinks   Types: 3  Cans of beer per week   Comment: daily  . Relevant history for tobacco use is:  Social History   Tobacco Use  Smoking Status Never  Smokeless Tobacco Never  .   Review of Systems  Constitutional: Negative.   HENT: Negative.    Eyes: Negative.  Negative for blurred vision.  Respiratory: Negative.  Negative for shortness of breath.   Cardiovascular: Negative.  Negative for chest pain and palpitations.  Gastrointestinal: Negative.   Endocrine: Negative.   Genitourinary: Negative.   Musculoskeletal: Negative.   Skin: Negative.   Allergic/Immunologic: Negative.   Neurological: Negative.   Hematological: Negative.   Psychiatric/Behavioral: Negative.      Today's Vitals   07/10/21 0942 07/10/21 1035  BP: (!) 146/80 134/72  Pulse: 65   Temp: 98.5 F (36.9 C)   TempSrc: Oral   Weight: 203 lb 9.6 oz (92.4 kg)   Height: 5' 7.6" (1.717 m)    Body mass index is 31.32 kg/m.  Wt Readings from Last 3 Encounters:  07/10/21 203 lb 9.6 oz (92.4 kg)  06/18/21 200 lb 12.8 oz (91.1 kg)  06/03/21 203 lb 6.4 oz (92.3 kg)    Objective:  Physical Exam Vitals and nursing note reviewed.  Constitutional:      Appearance: Normal appearance.  HENT:     Head: Normocephalic and atraumatic.     Right Ear: Tympanic membrane, ear canal and external ear normal.     Left Ear: Tympanic membrane, ear canal and external ear normal.     Nose:     Comments: Masked     Mouth/Throat:     Comments: Masked  Eyes:     Extraocular Movements: Extraocular movements intact.     Conjunctiva/sclera: Conjunctivae normal.     Pupils: Pupils are equal, round, and reactive to light.  Cardiovascular:     Rate and Rhythm: Normal rate and regular rhythm.     Pulses: Normal pulses.          Dorsalis pedis pulses are 2+ on the right side and 2+ on the left side.     Heart sounds: Normal heart sounds.  Pulmonary:     Effort: Pulmonary effort is normal.     Breath sounds: Normal breath sounds.  Chest:   Breasts:    Right: Normal. No swelling, bleeding, inverted nipple, mass or nipple discharge.     Left: Normal. No swelling, bleeding, inverted nipple, mass or nipple discharge.  Abdominal:     General: Bowel sounds are normal.     Palpations: Abdomen is soft.  Genitourinary:    Prostate: Normal.     Rectum: Normal. Guaiac result negative.  Musculoskeletal:        General: Normal range of motion.     Cervical back: Normal range of motion and neck supple.  Feet:     Right foot:     Protective Sensation: 5 sites tested.  5 sites sensed.     Skin integrity: Callus and dry skin present.     Toenail Condition: Right toenails are abnormally thick.  Left foot:     Protective Sensation: 5 sites tested.  5 sites sensed.     Skin integrity: Callus and dry skin present.     Toenail Condition: Left toenails are abnormally thick.  Skin:    General: Skin is warm.  Neurological:     General: No focal deficit present.     Mental Status: He is alert.  Psychiatric:        Mood and Affect: Mood normal.        Behavior: Behavior normal.        Assessment And Plan:    1. Encounter for annual physical exam Comments: A full exam was performed. DRE performed, stool is heme negative. PATIENT IS ADVISED TO GET 30-45 MINUTES REGULAR EXERCISE NO LESS THAN FOUR TO FIVE DAYS PER WEEK - BOTH WEIGHTBEARING EXERCISES AND AEROBIC ARE RECOMMENDED.  PATIENT IS ADVISED TO FOLLOW A HEALTHY DIET WITH AT LEAST SIX FRUITS/VEGGIES PER DAY, DECREASE INTAKE OF RED MEAT, AND TO INCREASE FISH INTAKE TO TWO DAYS PER WEEK.  MEATS/FISH SHOULD NOT BE FRIED, BAKED OR BROILED IS PREFERABLE.  IT IS ALSO IMPORTANT TO CUT BACK ON YOUR SUGAR INTAKE. PLEASE AVOID ANYTHING WITH ADDED SUGAR, CORN SYRUP OR OTHER SWEETENERS. IF YOU MUST USE A SWEETENER, YOU CAN TRY STEVIA. IT IS ALSO IMPORTANT TO AVOID ARTIFICIALLY SWEETENERS AND DIET BEVERAGES. LASTLY, I SUGGEST WEARING SPF 50 SUNSCREEN ON EXPOSED PARTS AND ESPECIALLY WHEN IN THE DIRECT  SUNLIGHT FOR AN EXTENDED PERIOD OF TIME.  PLEASE AVOID FAST FOOD RESTAURANTS AND INCREASE YOUR WATER INTAKE.  - POC Hemoccult Bld/Stl (1-Cd Office Dx)  2. Type 2 diabetes mellitus with stage 2 chronic kidney disease, without long-term current use of insulin (HCC) Comments: Diabetic foot exam was performed. I will refer him for diabetic eye exam. He will rto in 3 months for re-evaluation. I DISCUSSED WITH THE PATIENT AT LENGTH REGARDING THE GOALS OF GLYCEMIC CONTROL AND POSSIBLE LONG-TERM COMPLICATIONS.  I  ALSO STRESSED THE IMPORTANCE OF COMPLIANCE WITH HOME GLUCOSE MONITORING, DIETARY RESTRICTIONS INCLUDING AVOIDANCE OF SUGARY DRINKS/PROCESSED FOODS,  ALONG WITH REGULAR EXERCISE.  I  ALSO STRESSED THE IMPORTANCE OF ANNUAL EYE EXAMS, SELF FOOT CARE AND COMPLIANCE WITH OFFICE VISITS.  - CBC no Diff - Lipid panel - Ambulatory referral to Ophthalmology - BMP8+eGFR - Hemoglobin A1c  3. Hypertensive nephropathy Comments: Chronic, uncontrolled. Repeat BP 134/72. Goal BP <130/80. EKG performed, NSR w/o acute changes.  He is encouraged to follow low sodium diet. HE will rto in 4-6 months for re-evaluation.  - POCT Urinalysis Dipstick (81002) - POCT UA - Microalbumin - EKG 12-Lead  4. Pure hypercholesterolemia Comments: Chronic, goal LDL <70. He is encouraged to live a heart healthy lifestyle.  He is currently taking atorvastatin 4m w/o any issues. I will check lipid panel today and make further recommendations as needed.   5. Class 1 obesity due to excess calories with serious comorbidity and body mass index (BMI) of 31.0 to 31.9 in adult He is encouraged to strive for BMI less than 30 to decrease cardiac risk. Advised to aim for at least 150 minutes of exercise per week.  Patient was given opportunity to ask questions. Patient verbalized understanding of the plan and was able to repeat key elements of the plan. All questions were answered to their satisfaction.   I, RMaximino Greenland MD, have  reviewed all documentation for this visit. The documentation on 07/10/21 for the exam, diagnosis, procedures, and orders are all accurate and complete.  THE PATIENT IS ENCOURAGED TO PRACTICE SOCIAL DISTANCING DUE TO THE COVID-19 PANDEMIC.

## 2021-07-11 LAB — CBC
Hematocrit: 44.1 % (ref 37.5–51.0)
Hemoglobin: 15.5 g/dL (ref 13.0–17.7)
MCH: 29.9 pg (ref 26.6–33.0)
MCHC: 35.1 g/dL (ref 31.5–35.7)
MCV: 85 fL (ref 79–97)
Platelets: 183 10*3/uL (ref 150–450)
RBC: 5.19 x10E6/uL (ref 4.14–5.80)
RDW: 12.8 % (ref 11.6–15.4)
WBC: 7.5 10*3/uL (ref 3.4–10.8)

## 2021-07-11 LAB — LIPID PANEL
Chol/HDL Ratio: 2.7 ratio (ref 0.0–5.0)
Cholesterol, Total: 197 mg/dL (ref 100–199)
HDL: 72 mg/dL (ref >39)
LDL Chol Calc (NIH): 98 mg/dL (ref 0–99)
Triglycerides: 157 mg/dL — ABNORMAL HIGH (ref 0–149)
VLDL Cholesterol Cal: 27 mg/dL (ref 5–40)

## 2021-07-11 LAB — BMP8+EGFR
BUN/Creatinine Ratio: 14 (ref 10–24)
BUN: 18 mg/dL (ref 8–27)
CO2: 23 mmol/L (ref 20–29)
Calcium: 9.8 mg/dL (ref 8.6–10.2)
Chloride: 106 mmol/L (ref 96–106)
Creatinine, Ser: 1.3 mg/dL — ABNORMAL HIGH (ref 0.76–1.27)
Glucose: 174 mg/dL — ABNORMAL HIGH (ref 70–99)
Potassium: 4.6 mmol/L (ref 3.5–5.2)
Sodium: 143 mmol/L (ref 134–144)
eGFR: 58 mL/min/{1.73_m2} — ABNORMAL LOW (ref >59)

## 2021-07-11 LAB — HEMOGLOBIN A1C
Est. average glucose Bld gHb Est-mCnc: 212 mg/dL
Hgb A1c MFr Bld: 9 % — ABNORMAL HIGH (ref 4.8–5.6)

## 2021-08-01 ENCOUNTER — Other Ambulatory Visit: Payer: Self-pay | Admitting: Internal Medicine

## 2021-08-13 ENCOUNTER — Ambulatory Visit (INDEPENDENT_AMBULATORY_CARE_PROVIDER_SITE_OTHER): Payer: Medicare HMO

## 2021-08-13 DIAGNOSIS — I129 Hypertensive chronic kidney disease with stage 1 through stage 4 chronic kidney disease, or unspecified chronic kidney disease: Secondary | ICD-10-CM

## 2021-08-13 DIAGNOSIS — N182 Chronic kidney disease, stage 2 (mild): Secondary | ICD-10-CM

## 2021-08-13 DIAGNOSIS — E1122 Type 2 diabetes mellitus with diabetic chronic kidney disease: Secondary | ICD-10-CM

## 2021-08-13 NOTE — Progress Notes (Addendum)
Chronic Care Management Pharmacy Note  08/20/2021 Name:  Warren [name] MRN:  [id number] DOB:  June 01, 1946  Summary: Patient reports that he is feeling better now.   Recommendations/Changes made from today's visit: Recommend patient continue to taking medication daily, and drink plenty of water.   Plan: Health concierge to follow up with patient in regards to patient assistance application pending approval.    Subjective: Warren [name] is an 76 y.o. year old male who is a primary patient of Glendale Chard, MD.  The CCM team was consulted for assistance with disease management and care coordination needs.    Engaged with patient by telephone for follow up visit in response to provider referral for pharmacy case management and/or care coordination services. [name] reports that his friend went to the hospital and had to have a pacemaker placed for his heart. He is doing well and enjoyed his holidays he understands the importance of his medications.   Consent to Services:  The patient was given information about Chronic Care Management services, agreed to services, and gave verbal consent prior to initiation of services.  Please see initial visit note for detailed documentation.   Patient Care Team: Glendale Chard, MD as PCP - General (Internal Medicine) Mayford Knife, [facility] (Pharmacist)  Recent office visits: 07/10/2021 PCP OV 06/03/2021 PCP OV  Recent consult visits: None noted in the past 6 months   Hospital visits: None in previous 6 months   Objective:  Lab Results  Component Value Date   CREATININE 1.30 (H) 07/10/2021   BUN 18 07/10/2021   GFRNONAA 59 (L) 02/03/2019   GFRAA 68 02/03/2019   NA 143 07/10/2021   K 4.6 07/10/2021   CALCIUM 9.8 07/10/2021   CO2 23 07/10/2021   GLUCOSE 174 (H) 07/10/2021    Lab Results  Component Value Date/Time   HGBA1C 9.0 (H) 07/10/2021 10:39 AM   HGBA1C 9.1 (H) 06/03/2021 10:09 AM   MICROALBUR 10 07/10/2021 10:17 AM    MICROALBUR 30 03/26/2020 03:14 PM    Last diabetic Eye exam: No results found for: HMDIABEYEEXA  Last diabetic Foot exam: No results found for: HMDIABFOOTEX   Lab Results  Component Value Date   CHOL 197 07/10/2021   HDL 72 07/10/2021   LDLCALC 98 07/10/2021   TRIG 157 (H) 07/10/2021   CHOLHDL 2.7 07/10/2021    Hepatic Function Latest Ref Rng & Units 06/03/2021 12/13/2020 02/03/2019  Total Protein 6.0 - 8.5 g/dL 7.2 6.8 7.2  Albumin 3.7 - 4.7 g/dL 4.2 4.2 4.5  AST 0 - 40 IU/L _0 ALT 0 - 44 IU/L _1 Alk Phosphatase 44 - 121 IU/L 68 68 59  Total Bilirubin 0.0 - 1.2 mg/dL 0.7 0.7 0.5    Lab Results  Component Value Date/Time   TSH 1.60 10/01/2017 12:00 AM    CBC Latest Ref Rng & Units 07/10/2021 12/13/2020 10/13/2018  WBC 3.4 - 10.8 x10E3/uL 7.5 6.9 8.9  Hemoglobin 13.0 - 17.7 g/dL 15.5 15.2 14.6  Hematocrit 37.5 - 51.0 % 44.1 43.2 42.6  Platelets 150 - 450 x10E3/uL 183 186 186    Lab Results  Component Value Date/Time   VD25OH 36.1 02/03/2018 12:00 AM    Clinical ASCVD: No  The 10-year ASCVD risk score (Arnett DK, et al., 2019) is: 52.7%   Values used to calculate the score:     Age: 19 years     Sex: Male     Is Non-Hispanic African American:  Yes     Diabetic: Yes     Tobacco smoker: Yes     Systolic Blood Pressure: 818 mmHg     Is BP treated: Yes     HDL Cholesterol: 72 mg/dL     Total Cholesterol: 197 mg/dL    Depression screen [facility] 2/9 12/13/2020 12/13/2020 07/12/2019  Decreased Interest 0 0 0  Down, Depressed, Hopeless 0 0 0  PHQ - 2 Score 0 0 0  Altered sleeping - - 0  Tired, decreased energy - - 0  Change in appetite - - 0  Feeling bad or failure about yourself  - - 0  Trouble concentrating - - 0  Moving slowly or fidgety/restless - - 0  Suicidal thoughts - - 0  PHQ-9 Score - - 0  Difficult doing work/chores - - Not difficult at all     Social History   Tobacco Use  Smoking Status Never  Smokeless Tobacco Never   BP Readings from Last 3  Encounters:  07/10/21 134/72  06/18/21 122/74  06/03/21 (!) 168/84   Pulse Readings from Last 3 Encounters:  07/10/21 65  06/18/21 61  06/03/21 60   Wt Readings from Last 3 Encounters:  07/10/21 203 lb 9.6 oz (92.4 kg)  06/18/21 200 lb 12.8 oz (91.1 kg)  06/03/21 203 lb 6.4 oz (92.3 kg)   BMI Readings from Last 3 Encounters:  07/10/21 31.32 kg/m  06/18/21 30.53 kg/m  06/03/21 30.93 kg/m    Assessment/Interventions: Review of patient past medical history, allergies, medications, health status, including review of consultants reports, laboratory and other test data, was performed as part of comprehensive evaluation and provision of chronic care management services.   SDOH:  (Social Determinants of Health) assessments and interventions performed: No  SDOH Screenings   Alcohol Screen: Not on file  Depression (PHQ2-9): Low Risk    PHQ-2 Score: 0  Financial Resource Strain: High Risk   Difficulty of Paying Living Expenses: Very hard  Food Insecurity: No Food Insecurity   Worried About Charity fundraiser in the Last Year: Never true   Ran Out of Food in the Last Year: Never true  Housing: Not on file  Physical Activity: Inactive   Days of Exercise per Week: 0 days   Minutes of Exercise per Session: 0 min  Social Connections: Not on file  Stress: No Stress Concern Present   Feeling of Stress : Not at all  Tobacco Use: Low Risk    Smoking Tobacco Use: Never   Smokeless Tobacco Use: Never   Passive Exposure: Not on file  Transportation Needs: No Transportation Needs   Lack of Transportation (Medical): No   Lack of Transportation (Non-Medical): No    CCM Care Plan  No Known Allergies  Medications Reviewed Today     Reviewed by Mayford Knife, RPH (Pharmacist) on 08/13/21 at [place] List Status: <None>   Medication Order Taking? Sig Documenting Provider Last Dose Status Informant  amLODipine (NORVASC) 5 MG tablet [id number] No Take 1 tablet (5 mg total) by  mouth daily. Glendale Chard, MD Taking Active   atorvastatin (LIPITOR) 40 MG tablet [id number] No Take 1 tablet (40 mg total) by mouth daily. Glendale Chard, MD Taking Active   Blood Glucose Monitoring Suppl (ONE TOUCH ULTRA 2) w/Device KIT [id number] No Use as directed to check blood sugars 1 time per day dx: e11.65 Glendale Chard, MD Taking Active   dapagliflozin propanediol (FARXIGA) 10 MG TABS tablet [id number] No Take 1  tablet (10 mg total) by mouth daily. Glendale Chard, MD Taking Active   glucose blood (ONE TOUCH ULTRA TEST) test strip [id number] No Use as instructed to check blood sugars 1 time per day dx: e11.65 dx: e11.22 Glendale Chard, MD Taking Active   sildenafil (REVATIO) 20 MG tablet [id number]  Take 1 tablet (20 mg total) by mouth as needed. MAX 3 TO 5 TABLETS PER DAY Glendale Chard, MD  Active   sitaGLIPtin (JANUVIA) 100 MG tablet [id number] No Take 1 tablet (100 mg total) by mouth daily. Glendale Chard, MD Taking Active   telmisartan-hydrochlorothiazide (MICARDIS HCT) 80-12.5 MG tablet [id number] No Take 1 tablet by mouth daily. 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   Snoring 10/14/2017   Nocturia more than twice per night 10/14/2017   CKD (chronic kidney disease) stage 2, GFR 60-89 ml/min 10/14/2017   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    Immunization History  Administered Date(s) Administered   PFIZER(Purple Top)SARS-COV-2 Vaccination 12/01/2019, 12/26/2019, 06/29/2020   Tdap 07/19/2013    Conditions to be  addressed/monitored:  Hypertension and Diabetes  Care Plan : [place]  Updates made by Mayford Knife, [place] since 08/20/2021 12:00 AM     Problem: HTN, DM II   Priority: High     Long-Range Goal: Disease Management   Recent Progress: On track  Priority: High  Note:   Current Barriers:  Unable to independently afford treatment regimen Unable to independently monitor therapeutic efficacy  Pharmacist Clinical Goal(s):  Patient will achieve adherence to monitoring guidelines and medication adherence to achieve therapeutic efficacy through collaboration with PharmD and provider.   Interventions: 1:1 collaboration with Glendale Chard, MD regarding development and update of comprehensive plan of care as evidenced by provider attestation and co-signature Inter-disciplinary care team collaboration (see longitudinal plan of care) Comprehensive medication review performed; medication list updated in electronic medical record  Hypertension (BP goal <130/80) -Controlled -Current treatment: Amlodipine 5 mg tablet once per day Appropriate, Effective, Safe, Accessible Telmisartan-HCTZ 80 - 12.5 mg tablet once per day Appropriate, Effective, Safe, Accessible -Current home readings: 118/70, 121/82 - everyday  -Current dietary habits: he has stopped eating as much fried chicken, and fried foods due to his significant other diet habits. He is eating more vegetables and high fiber foods.  -Current exercise habits: he is still taking care of his mother on a daily basis  -Denies hypotensive/hypertensive symptoms -Educated on BP goals and benefits of medications for prevention of heart attack, stroke and kidney damage; Importance of home blood pressure monitoring; -Counseled to monitor BP at home at least twice per week, document, and provide log at future appointments -Recommended to continue current medication  Diabetes (A1c goal <8%) -Controlled -Current medications: Januvia 100  mg tablet once per day Appropriate, Effective, Safe, Accessible Farxiga 10 mg tablet once per day Appropriate, Effective, Safe, Accessible -Current home glucose readings Glucose readings: 12/21-01/10/2020 - averaging around 161 the highest number was 229-12/30 161, 163, 171, 133  -Denies hypoglycemic/hyperglycemic symptoms  -  Current meal patterns: his friend is a vegetarian and so his diet has changed  drinks: he is drinking more water, not drinking 3 bottles of 15 ounce bottles per day  -Current exercise: please see hypertension -Educated on A1c and blood sugar goals; Complications of diabetes including kidney damage, retinal damage, and cardiovascular disease; Prevention and management of hypoglycemic episodes; Benefits of routine self-monitoring of blood sugar;  -Counseled to check feet daily and get yearly eye exams  -He has an appointment with Dr. Katy Fitch on Thursday at 2:30 PM  -[place] to follow up on [employer] to order refills  -Januvia pending approval for patient assistance, patient reports having a month worth of medication left, will also follow up on patients Farxiga, that is already approved  -Recommended to continue current medication  Patient Goals/Self-Care Activities Patient will:  - take medications as prescribed as evidenced by patient report and record review  Follow Up Plan: The patient has been provided with contact information for the care management team and has been advised to call with any health related questions or concerns.       Medication Assistance: Application for Farxiga  medication assistance program. in process.  Anticipated assistance start date 08/2021.  See plan of care for additional detail.  Compliance/Adherence/Medication fill history: Care Gaps: COVID-19 Booster Ophthalmology Exam  Star-Rating Drugs: Atorvastatin 40 mg tablet  Januvia 100 mg tablet Farxiga 10 mg tablet  Telmisartan-Hydrochlorothiazide 80-12.5 mg tablet    Patient's preferred pharmacy is:  [place], [place] [address] [place] [address] Phone: [phone] Fax: [place], [place] [address] [place] [address] Phone: [phone] Fax: [phone]  Uses pill box? Yes Pt endorses 95% compliance  We discussed: Benefits of medication synchronization, packaging and delivery as well as enhanced pharmacist oversight with [employer]. Patient decided to: Continue current medication management strategy  Care Plan and Follow Up Patient Decision:  Patient agrees to Care Plan and Follow-up.  Plan: The patient has been provided with contact information for the care management team and has been advised to call with any health related questions or concerns.   [name], CPP, PharmD Clinical Pharmacist Practitioner [facility] [phone]

## 2021-08-15 DIAGNOSIS — H2513 Age-related nuclear cataract, bilateral: Secondary | ICD-10-CM | POA: Diagnosis not present

## 2021-08-15 DIAGNOSIS — H43812 Vitreous degeneration, left eye: Secondary | ICD-10-CM | POA: Diagnosis not present

## 2021-08-15 DIAGNOSIS — E119 Type 2 diabetes mellitus without complications: Secondary | ICD-10-CM | POA: Diagnosis not present

## 2021-08-20 NOTE — Patient Instructions (Signed)
Visit Information It was great speaking with you today!  Please let me know if you have any questions about our visit.   Goals Addressed             This Visit's Progress    Manage My Medicine       Timeframe:  Long-Range Goal Priority:  High             Expected End Date:                       Follow Up Date 11/15/2021  In progress: - call for medicine refill 2 or 3 days before it runs out - call if I am sick and can't take my medicine - keep a list of all the medicines I take; vitamins and herbals too    Why is this important?   These steps will help you keep on track with your medicines.       Track and Manage My Blood Pressure-Hypertension       Timeframe:  Long-Range Goal Priority:  High Start Date:                             Expected End Date:                       Follow Up Date 11/14/2021    In Progress: - check blood pressure 3 times per week - choose a place to take my blood pressure (home, clinic or office, retail store) - write blood pressure results in a log or diary    Why is this important?   You won't feel high blood pressure, but it can still hurt your blood vessels.  High blood pressure can cause heart or kidney problems. It can also cause a stroke.  Making lifestyle changes like losing a little weight or eating less salt will help.  Checking your blood pressure at home and at different times of the day can help to control blood pressure.  If the doctor prescribes medicine remember to take it the way the doctor ordered.  Call the office if you cannot afford the medicine or if there are questions about it.     Notes:  -Congratulations on making checking your BP a habit for healthy living!         Patient Care Plan: CCM Pharmacy Care Plan     Problem Identified: HTN, DM II   Priority: High     Long-Range Goal: Disease Management   Recent Progress: On track  Priority: High  Note:   Current Barriers:  Unable to independently afford  treatment regimen Unable to independently monitor therapeutic efficacy  Pharmacist Clinical Goal(s):  Patient will achieve adherence to monitoring guidelines and medication adherence to achieve therapeutic efficacy through collaboration with PharmD and provider.   Interventions: 1:1 collaboration with Dorothyann Peng, MD regarding development and update of comprehensive plan of care as evidenced by provider attestation and co-signature Inter-disciplinary care team collaboration (see longitudinal plan of care) Comprehensive medication review performed; medication list updated in electronic medical record  Hypertension (BP goal <130/80) -Controlled -Current treatment: Amlodipine 5 mg tablet once per day Telmisartan-HCTZ 80 - 12.5 mg tablet once per day  -Current home readings: 118/70, 121/82 - everyday  -Current dietary habits: he has stopped eating as much fried chicken, and fried foods due to his significant other diet habits. He is eating more  vegetables and high fiber foods.  -Current exercise habits: he is still taking care of his mother on a daily basis  -Denies hypotensive/hypertensive symptoms -Educated on BP goals and benefits of medications for prevention of heart attack, stroke and kidney damage; Importance of home blood pressure monitoring; -Counseled to monitor BP at home at least twice per week, document, and provide log at future appointments -Recommended to continue current medication  Diabetes (A1c goal <8%) -Controlled -Current medications: Januvia 100 mg tablet once per day  Farxiga 10 mg tablet once per day   -Current home glucose readings Glucose readings: 12/21-01/10/2020 - averaging around 161 the highest number was 229-12/30 161, 163, 171, 133  -Denies hypoglycemic/hyperglycemic symptoms  -Current meal patterns: his friend is a vegetarian and so his diet has changed  drinks: he is drinking more water, not drinking 3 bottles of 15 ounce bottles per day  -Current  exercise: please see hypertension -Educated on A1c and blood sugar goals; Complications of diabetes including kidney damage, retinal damage, and cardiovascular disease; Prevention and management of hypoglycemic episodes; Benefits of routine self-monitoring of blood sugar;  -Counseled to check feet daily and get yearly eye exams  -He has an appointment with Dr. Dione Booze on Thursday at 2:30 PM  -Health Concierge to follow up on [employer] to order refills  -Januvia pending approval for patient assistance, patient reports having a month work for, will also follow up with Jardiance -Recommended to continue current medication  Patient Goals/Self-Care Activities Patient will:  - take medications as prescribed as evidenced by patient report and record review  Follow Up Plan: The patient has been provided with contact information for the care management team and has been advised to call with any health related questions or concerns.       Patient agreed to services and verbal consent obtained.   The patient verbalized understanding of instructions, educational materials, and care plan provided today and agreed to receive a mailed copy of patient instructions, educational materials, and care plan.   [name], PharmD Clinical Pharmacist [facility] [phone]

## 2021-09-10 DIAGNOSIS — E1122 Type 2 diabetes mellitus with diabetic chronic kidney disease: Secondary | ICD-10-CM | POA: Diagnosis not present

## 2021-09-10 DIAGNOSIS — I129 Hypertensive chronic kidney disease with stage 1 through stage 4 chronic kidney disease, or unspecified chronic kidney disease: Secondary | ICD-10-CM | POA: Diagnosis not present

## 2021-09-10 DIAGNOSIS — N182 Chronic kidney disease, stage 2 (mild): Secondary | ICD-10-CM | POA: Diagnosis not present

## 2021-09-16 ENCOUNTER — Other Ambulatory Visit: Payer: Self-pay | Admitting: Internal Medicine

## 2021-09-20 ENCOUNTER — Telehealth: Payer: Self-pay

## 2021-09-20 NOTE — Chronic Care Management (AMB) (Signed)
Chronic Care Management Pharmacy Assistant   Name: Jadore Veals  MRN: [id number] DOB: Mar 25, 1946  Reason for Encounter: Disease State/ General  Recent office visits:  None  Recent consult visits:  None  Hospital visits:  None in previous 6 months  Medications: Outpatient Encounter Medications as of 09/20/2021  Medication Sig   amLODipine (NORVASC) 5 MG tablet Take 1 tablet (5 mg total) by mouth daily.   atorvastatin (LIPITOR) 40 MG tablet Take 1 tablet (40 mg total) by mouth daily.   Blood Glucose Monitoring Suppl (ONE TOUCH ULTRA 2) w/Device KIT Use as directed to check blood sugars 1 time per day dx: e11.65   dapagliflozin propanediol (FARXIGA) 10 MG TABS tablet Take 1 tablet (10 mg total) by mouth daily.   glucose blood (ONE TOUCH ULTRA TEST) test strip Use as instructed to check blood sugars 1 time per day dx: e11.65 dx: e11.22   sildenafil (REVATIO) 20 MG tablet Take 1 tablet (20 mg total) by mouth as needed. MAX 3 TO 5 TABLETS PER DAY   sitaGLIPtin (JANUVIA) 100 MG tablet Take 1 tablet (100 mg total) by mouth daily.   telmisartan-hydrochlorothiazide (MICARDIS HCT) 80-12.5 MG tablet Take 1 tablet by mouth daily.   No facility-administered encounter medications on file as of 09/20/2021.   Recent Relevant Labs: Lab Results  Component Value Date/Time   HGBA1C 9.0 (H) 07/10/2021 10:39 AM   HGBA1C 9.1 (H) 06/03/2021 10:09 AM   MICROALBUR 10 07/10/2021 10:17 AM   MICROALBUR 30 03/26/2020 03:14 PM    Kidney Function Lab Results  Component Value Date/Time   CREATININE 1.30 (H) 07/10/2021 10:39 AM   CREATININE 1.35 (H) 06/03/2021 10:09 AM   GFRNONAA 59 (L) 02/03/2019 05:04 PM   GFRAA 68 02/03/2019 05:04 PM    Current antihyperglycemic regimen:  Januvia 100 mg daily Farxiga 10 mg daily  What recent interventions/DTPs have been made to improve glycemic control:  Educated on A1c and blood sugar goals; Complications of diabetes including kidney damage, retinal damage,  and cardiovascular disease; Prevention and management of hypoglycemic episodes; Benefits of routine self-monitoring of blood sugar;  -Counseled to check feet daily and get yearly eye exams  Have there been any recent hospitalizations or ED visits since last visit with CPP? No  Patient denies hypoglycemic symptoms  Patient denies hyperglycemic symptoms  How often are you checking your blood sugar? once daily  What are your blood sugars ranging?  Fasting: 150-160 Before meals: None After meals: None Bedtime: None  During the week, how often does your blood glucose drop below 70? Never  Are you checking your feet daily/regularly? Daily  Adherence Review: Is the patient currently on a STATIN medication? Yes Is the patient currently on ACE/ARB medication? No Does the patient have >5 day gap between last estimated fill dates? No  Reviewed chart prior to disease state call. Spoke with patient regarding BP  Recent Office Vitals: BP Readings from Last 3 Encounters:  07/10/21 134/72  06/18/21 122/74  06/03/21 (!) 168/84   Pulse Readings from Last 3 Encounters:  07/10/21 65  06/18/21 61  06/03/21 60    Wt Readings from Last 3 Encounters:  07/10/21 203 lb 9.6 oz (92.4 kg)  06/18/21 200 lb 12.8 oz (91.1 kg)  06/03/21 203 lb 6.4 oz (92.3 kg)     Kidney Function Lab Results  Component Value Date/Time   CREATININE 1.30 (H) 07/10/2021 10:39 AM   CREATININE 1.35 (H) 06/03/2021 10:09 AM   GFRNONAA 59 (L)  02/03/2019 05:04 PM   GFRAA 68 02/03/2019 05:04 PM    BMP Latest Ref Rng & Units 07/10/2021 06/03/2021 02/19/2021  Glucose 70 - 99 mg/dL 174(H) 186(H) 120(H)  BUN 8 - 27 mg/dL _0 Creatinine 0.76 - 1.27 mg/dL 1.30(H) 1.35(H) 1.41(H)  BUN/Creat Ratio 10 - _1 Sodium 134 - 144 mmol/L 143 141 142  Potassium 3.5 - 5.2 mmol/L 4.6 5.3(H) 5.0  Chloride 96 - 106 mmol/L 106 105 105  CO2 20 - 29 mmol/L _2 Calcium 8.6 - 10.2 mg/dL 9.8 9.9 10.3(H)    Current  antihypertensive regimen:  Amlodipine 5 mg daily Telmisartan-HCTZ 80 - 12.5 mg daily  How often are you checking your Blood Pressure? daily  Current home BP readings: 124/80  What recent interventions/DTPs have been made by any provider to improve Blood Pressure control since last CPP Visit:  Educated on BP goals and benefits of medications for prevention of heart attack, stroke and kidney damage; Importance of home blood pressure monitoring; -Counseled to monitor BP at home at least twice per week, document, and provide log at future appointments -Recommended to continue current medication  Any recent hospitalizations or ED visits since last visit with CPP? No  What diet changes have been made to improve Blood Pressure Control?  Patient states he has limited his salt intake and tries to drink water daily.  What exercise is being done to improve your Blood Pressure Control?  Patient stated he walks daily.  Adherence Review: Is the patient currently on ACE/ARB medication? Yes Does the patient have >5 day gap between last estimated fill dates? No  NOTES Care Gaps: Shingrix overdue Covid booster overdue  Star Rating Drugs: Atorvastatin 40 mg- Last filled 09-16-2021 90 DS [place] 10 mg- Samples Januvia 100 mg- Samples (PAP in process) Telmisartan-HCTZ 80 -12.5 mg- Last filled  [place] Pharmacist Assistant [phone]

## 2021-10-02 ENCOUNTER — Other Ambulatory Visit: Payer: Self-pay | Admitting: Internal Medicine

## 2021-10-03 ENCOUNTER — Encounter: Payer: Self-pay | Admitting: Internal Medicine

## 2021-10-03 ENCOUNTER — Ambulatory Visit (INDEPENDENT_AMBULATORY_CARE_PROVIDER_SITE_OTHER): Payer: Medicare HMO | Admitting: Internal Medicine

## 2021-10-03 ENCOUNTER — Other Ambulatory Visit: Payer: Self-pay

## 2021-10-03 VITALS — BP 122/70 | HR 59 | Temp 98.0°F | Ht 68.0 in | Wt 205.2 lb

## 2021-10-03 DIAGNOSIS — E6609 Other obesity due to excess calories: Secondary | ICD-10-CM

## 2021-10-03 DIAGNOSIS — E1122 Type 2 diabetes mellitus with diabetic chronic kidney disease: Secondary | ICD-10-CM | POA: Diagnosis not present

## 2021-10-03 DIAGNOSIS — Z6831 Body mass index (BMI) 31.0-31.9, adult: Secondary | ICD-10-CM | POA: Diagnosis not present

## 2021-10-03 DIAGNOSIS — N182 Chronic kidney disease, stage 2 (mild): Secondary | ICD-10-CM | POA: Diagnosis not present

## 2021-10-03 DIAGNOSIS — I129 Hypertensive chronic kidney disease with stage 1 through stage 4 chronic kidney disease, or unspecified chronic kidney disease: Secondary | ICD-10-CM

## 2021-10-03 DIAGNOSIS — Z2821 Immunization not carried out because of patient refusal: Secondary | ICD-10-CM

## 2021-10-03 LAB — CMP14+EGFR
ALT: 15 IU/L (ref 0–44)
AST: 15 IU/L (ref 0–40)
Albumin/Globulin Ratio: 1.9 (ref 1.2–2.2)
Albumin: 4.3 g/dL (ref 3.7–4.7)
Alkaline Phosphatase: 61 IU/L (ref 44–121)
BUN/Creatinine Ratio: 11 (ref 10–24)
BUN: 12 mg/dL (ref 8–27)
Bilirubin Total: 0.9 mg/dL (ref 0.0–1.2)
CO2: 24 mmol/L (ref 20–29)
Calcium: 9.9 mg/dL (ref 8.6–10.2)
Chloride: 102 mmol/L (ref 96–106)
Creatinine, Ser: 1.11 mg/dL (ref 0.76–1.27)
Globulin, Total: 2.3 g/dL (ref 1.5–4.5)
Glucose: 204 mg/dL — ABNORMAL HIGH (ref 70–99)
Potassium: 5.1 mmol/L (ref 3.5–5.2)
Sodium: 138 mmol/L (ref 134–144)
Total Protein: 6.6 g/dL (ref 6.0–8.5)
eGFR: 70 mL/min/{1.73_m2} (ref >59)

## 2021-10-03 LAB — HEMOGLOBIN A1C
Est. average glucose Bld gHb Est-mCnc: 206 mg/dL
Hgb A1c MFr Bld: 8.8 % — ABNORMAL HIGH (ref 4.8–5.6)

## 2021-10-03 NOTE — Patient Instructions (Signed)

## 2021-10-03 NOTE — Progress Notes (Signed)
Rich Brave [place],acting as a Education administrator for Maximino Greenland, MD.,have documented all relevant documentation on the behalf of Maximino Greenland, MD,as directed by  Maximino Greenland, MD while in the presence of Maximino Greenland, MD.  This visit occurred during the SARS-CoV-2 public health emergency.  Safety protocols were in place, including screening questions prior to the visit, additional usage of staff PPE, and extensive cleaning of exam room while observing appropriate contact time as indicated for disinfecting solutions.  Subjective:     Patient ID: Warren [name] , male    DOB: 03-14-1946 , 76 y.o.   MRN: [id number]   Chief Complaint  Patient presents with   Diabetes   Hypertension    HPI  The patient is here today for a follow-up on his diabetes/htn. Patient reports he has not been able to get in touch with anyone regarding his [place]. He states he has been out of medication for 3 weeks.   Diabetes He presents for his follow-up diabetic visit. He has type 2 diabetes mellitus. There are no hypoglycemic associated symptoms. Pertinent negatives for diabetes include no blurred vision, no chest pain, no polydipsia, no polyphagia and no polyuria. There are no hypoglycemic complications. Diabetic complications include nephropathy. Risk factors for coronary artery disease include diabetes mellitus, dyslipidemia, hypertension, male sex and sedentary lifestyle. He never participates in exercise. An ACE inhibitor/angiotensin II receptor blocker is being taken. He does not see a podiatrist.Eye exam is current.  Hypertension This is a chronic problem. The current episode started more than 1 year ago. The problem has been gradually improving since onset. The problem is controlled. Pertinent negatives include no blurred vision, chest pain, palpitations or shortness of breath. Risk factors for coronary artery disease include diabetes mellitus, dyslipidemia, male gender and sedentary lifestyle.  Past treatments include angiotensin blockers and diuretics. The current treatment provides moderate improvement. Compliance problems include exercise.  Hypertensive end-organ damage includes kidney disease.    Past Medical History:  Diagnosis Date   Chronic kidney disease    stage 2   Diabetes mellitus without complication (HCC)    Hypertension    Malaise and fatigue      Family History  Problem Relation Age of Onset   Hypertension Mother    Hypothyroidism Mother    Alzheimer's disease Father    Prostate cancer Father    Prostate cancer Brother      Current Outpatient Medications:    amLODipine (NORVASC) 5 MG tablet, Take 1 tablet (5 mg total) by mouth daily., Disp: 90 tablet, Rfl: 1   Blood Glucose Monitoring Suppl (ONE TOUCH ULTRA 2) w/Device KIT, Use as directed to check blood sugars 1 time per day dx: e11.65, Disp: 1 kit, Rfl: 1   dapagliflozin propanediol (FARXIGA) 10 MG TABS tablet, Take 1 tablet (10 mg total) by mouth daily., Disp: 90 tablet, Rfl: 3   glucose blood (ONE TOUCH ULTRA TEST) test strip, Use as instructed to check blood sugars 1 time per day dx: e11.65 dx: e11.22, Disp: 50 each, Rfl: 11   sildenafil (REVATIO) 20 MG tablet, Take 1 tablet (20 mg total) by mouth as needed. MAX 3 TO 5 TABLETS PER DAY, Disp: 60 tablet, Rfl: 1   sitaGLIPtin (JANUVIA) 100 MG tablet, Take 1 tablet (100 mg total) by mouth daily., Disp: 90 tablet, Rfl: 1   telmisartan-hydrochlorothiazide (MICARDIS HCT) 80-12.5 MG tablet, Take 1 tablet by mouth daily., Disp: , Rfl:    No Known Allergies   Review  of Systems  Constitutional: Negative.   Eyes: Negative.  Negative for blurred vision.  Respiratory: Negative.  Negative for shortness of breath.   Cardiovascular: Negative.  Negative for chest pain and palpitations.  Gastrointestinal: Negative.   Endocrine: Negative for polydipsia, polyphagia and polyuria.  Musculoskeletal: Negative.   Skin: Negative.   Neurological: Negative.    Psychiatric/Behavioral: Negative.      Today's Vitals   10/03/21 0856  BP: 122/70  Pulse: (!) 59  Temp: 98 F (36.7 C)  Weight: 205 lb 3.2 oz (93.1 kg)  Height: $Remove'5\' 8"'xyhaWaa$  (1.727 m)  PainSc: 0-No pain   Body mass index is 31.2 kg/m.  Wt Readings from Last 3 Encounters:  10/03/21 205 lb 3.2 oz (93.1 kg)  07/10/21 203 lb 9.6 oz (92.4 kg)  06/18/21 200 lb 12.8 oz (91.1 kg)     Objective:  Physical Exam Vitals and nursing note reviewed.  Constitutional:      Appearance: Normal appearance.  HENT:     Head: Normocephalic and atraumatic.     Nose:     Comments: Masked     Mouth/Throat:     Comments: Masked  Eyes:     Extraocular Movements: Extraocular movements intact.  Cardiovascular:     Rate and Rhythm: Normal rate and regular rhythm.     Heart sounds: Normal heart sounds.  Pulmonary:     Effort: Pulmonary effort is normal.     Breath sounds: Normal breath sounds.  Abdominal:     General: Bowel sounds are normal.     Palpations: Abdomen is soft.  Musculoskeletal:     Cervical back: Normal range of motion.  Skin:    General: Skin is warm.  Neurological:     General: No focal deficit present.     Mental Status: He is alert.  Psychiatric:        Mood and Affect: Mood normal.     Assessment And Plan:     1. Type 2 diabetes mellitus with stage 2 chronic kidney disease, without long-term current use of insulin (HCC) Comments: Chronic, I will check labs as below. I have reached out to CCM pharmacist, Coralyn Helling to have her check on PA program.  - CMP14+EGFR - Hemoglobin A1c  2. Hypertensive nephropathy Comments: Chronic, well controlled. No med changes. I will check renal function.  - CMP14+EGFR  3. Herpes zoster vaccination declined  4. Class 1 obesity due to excess calories with serious comorbidity and body mass index (BMI) of 31.0 to 31.9 in adult  He is encouraged to initially strive for BMI less than 30 to decrease cardiac risk. He is advised to exercise no less  than 150 minutes per week.     Patient was given opportunity to ask questions. Patient verbalized understanding of the plan and was able to repeat key elements of the plan. All questions were answered to their satisfaction.    I, Maximino Greenland, MD, have reviewed all documentation for this visit. The documentation on 10/03/21 for the exam, diagnosis, procedures, and orders are all accurate and complete.   IF YOU HAVE BEEN REFERRED TO A SPECIALIST, IT MAY TAKE 1-2 WEEKS TO SCHEDULE/PROCESS THE REFERRAL. IF YOU HAVE NOT HEARD FROM US/SPECIALIST IN TWO WEEKS, PLEASE GIVE [place] A CALL AT [phone].   THE PATIENT IS ENCOURAGED TO PRACTICE SOCIAL DISTANCING DUE TO THE COVID-19 PANDEMIC.

## 2021-10-18 ENCOUNTER — Telehealth: Payer: Self-pay

## 2021-10-18 NOTE — Chronic Care Management (AMB) (Signed)
    Chronic Care Management Pharmacy Assistant   Name: Warren [name]  MRN: [id number] DOB: 1945/12/23  Reason for Encounter: Disease State/ General  Recent office visits:  10-03-2021 [place] Chard, MD. Glucose= 204. A1C= 8.8. STOP Atorvastatin  Recent consult visits:  None  Hospital visits:  None in previous 6 months  Medications: Outpatient Encounter Medications as of 10/18/2021  Medication Sig   amLODipine (NORVASC) 5 MG tablet Take 1 tablet (5 mg total) by mouth daily.   Blood Glucose Monitoring Suppl (ONE TOUCH ULTRA 2) w/Device KIT Use as directed to check blood sugars 1 time per day dx: e11.65   dapagliflozin propanediol (FARXIGA) 10 MG TABS tablet Take 1 tablet (10 mg total) by mouth daily.   glucose blood (ONE TOUCH ULTRA TEST) test strip Use as instructed to check blood sugars 1 time per day dx: e11.65 dx: e11.22   sildenafil (REVATIO) 20 MG tablet Take 1 tablet (20 mg total) by mouth as needed. MAX 3 TO 5 TABLETS PER DAY   sitaGLIPtin (JANUVIA) 100 MG tablet Take 1 tablet (100 mg total) by mouth daily.   telmisartan-hydrochlorothiazide (MICARDIS HCT) 80-12.5 MG tablet Take 1 tablet by mouth daily.   No facility-administered encounter medications on file as of 10/18/2021.  Contacted [name] for general disease state and medication adherence call.   Patient is not > 5 days past due for refill on the following medications per chart history:  What concerns do you have about your medications? Patient stated no  The patient denies side effects with his medications.   How often do you forget or accidentally miss a dose? Never  Do you use a pillbox? Yes  Are you having any problems getting your medications from your pharmacy? No  Has the cost of your medications been a concern? No If yes, what medication and is patient assistance available or has it been applied for?  Since last visit with CPP, the following interventions have been made:  Educated on A1c and blood  sugar goals; Complications of diabetes including kidney damage, retinal damage, and cardiovascular disease; Prevention and management of hypoglycemic episodes; Benefits of routine self-monitoring of blood sugar;  -Counseled to check feet daily and get yearly eye exams  The patient has not had an ED visit since last contact.   The patient denies problems with their health.   he denies  concerns or questions for [name], at this time.   Patient states BP and BG readings are as follows: 112/79, 114/80  Fasting: 137, 155, 138 Before meals: None After meals: None Bedtime: None  NOTES: Patient stated he was approved for janumet 2023 instead of januvia. Januvia application was supposed to be faxed. Called [employer] and [employer] application was canceled but [place] application was not received. Application started and uploaded to mail.  Care Gaps: Covid booster AWV 12-19-2021  Star Rating Drugs: Farxiga 10 mg- Patient assistance Januvia 100 mg- Samples (PAP in process) Atorvastatin 40 mg- Last filled 09-16-2021 90 DS [place] Clinical Pharmacist Assistant [phone]

## 2021-11-13 ENCOUNTER — Telehealth: Payer: Self-pay

## 2021-11-13 NOTE — Chronic Care Management (AMB) (Signed)
    Chanel Mckesson was reminded to have all medications, supplements and any blood glucose and blood pressure readings available for review with [name], Pharm. D, at his telephone visit on 11-15-2021 at 9:00.   Huey Romans [facility] Clinical Pharmacist Assistant [phone]

## 2021-11-15 ENCOUNTER — Ambulatory Visit (INDEPENDENT_AMBULATORY_CARE_PROVIDER_SITE_OTHER): Payer: Medicare HMO

## 2021-11-15 DIAGNOSIS — E782 Mixed hyperlipidemia: Secondary | ICD-10-CM

## 2021-11-15 DIAGNOSIS — E1122 Type 2 diabetes mellitus with diabetic chronic kidney disease: Secondary | ICD-10-CM

## 2021-11-15 MED ORDER — ATORVASTATIN CALCIUM 80 MG PO TABS: 80.0000 mg | ORAL_TABLET | Freq: Every day | ORAL | 3 refills | Status: DC

## 2021-11-15 NOTE — Progress Notes (Signed)
Chronic Care Management Pharmacy Note  11/20/2021 Name:  Warren [name] MRN:  [id number] DOB:  04-22-1946  Summary: Patient reports that he is taking care of his mother.   Recommendations/Changes made from today's visit: Recommend patient be started on Atorvastatin 80 mg tablet, due to goal LDL <55.   Plan: PCP agreed with patients change in medication regimen, patient to have complete chemistry labs and lipid panel done around 8 weeks after starting his new regimen.    Subjective: Warren [name] is an 76 y.o. year old male who is a primary patient of Glendale Chard, MD.  The CCM team was consulted for assistance with disease management and care coordination needs.    Engaged with patient by telephone for follow up visit in response to provider referral for pharmacy case management and/or care coordination services. Patient reports that Ms. Lal had a stroke on the 23rd, she lost the use of her right side. When she went in last week she could not move anything, but now she is able to wiggle her toes and right  side. Her discharge date is the 19th. Some nights his daughter will stay with his mother, and most days he stays with his mother. He is concerned about next steps to take care of his mother, and trying to get her home. He does not want to think about putting her in a home, but he wants to make sure that he is taken care of.   Consent to Services:  The patient was given information about Chronic Care Management services, agreed to services, and gave verbal consent prior to initiation of services.  Please see initial visit note for detailed documentation.   Patient Care Team: Glendale Chard, MD as PCP - General (Internal Medicine) Mayford Knife, [facility] (Pharmacist)  Recent office visits: 10/03/2021 PCP OV 07/10/2021 PCP OV  Recent consult visits: None noted  Hospital visits: None in previous 6 months   Objective:  Lab Results  Component Value Date   CREATININE 1.11  10/03/2021   BUN 12 10/03/2021   EGFR 70 10/03/2021   GFRNONAA 59 (L) 02/03/2019   GFRAA 68 02/03/2019   NA 138 10/03/2021   K 5.1 10/03/2021   CALCIUM 9.9 10/03/2021   CO2 24 10/03/2021   GLUCOSE 204 (H) 10/03/2021    Lab Results  Component Value Date/Time   HGBA1C 8.8 (H) 10/03/2021 09:38 AM   HGBA1C 9.0 (H) 07/10/2021 10:39 AM   MICROALBUR 10 07/10/2021 10:17 AM   MICROALBUR 30 03/26/2020 03:14 PM    Last diabetic Eye exam: No results found for: HMDIABEYEEXA  Last diabetic Foot exam: No results found for: HMDIABFOOTEX   Lab Results  Component Value Date   CHOL 197 07/10/2021   HDL 72 07/10/2021   LDLCALC 98 07/10/2021   TRIG 157 (H) 07/10/2021   CHOLHDL 2.7 07/10/2021       Latest Ref Rng & Units 10/03/2021    9:38 AM 06/03/2021   10:09 AM 12/13/2020   10:05 AM  Hepatic Function  Total Protein 6.0 - 8.5 g/dL 6.6   7.2   6.8    Albumin 3.7 - 4.7 g/dL 4.3   4.2   4.2    AST 0 - 40 IU/L _0 ALT 0 - 44 IU/L _1 Alk Phosphatase 44 - 121 IU/L 61   68   68    Total Bilirubin 0.0 -  1.2 mg/dL 0.9   0.7   0.7      Lab Results  Component Value Date/Time   TSH 1.60 10/01/2017 12:00 AM       Latest Ref Rng & Units 07/10/2021   10:39 AM 12/13/2020   10:05 AM 10/13/2018   12:47 PM  CBC  WBC 3.4 - 10.8 x10E3/uL 7.5   6.9   8.9    Hemoglobin 13.0 - 17.7 g/dL 15.5   15.2   14.6    Hematocrit 37.5 - 51.0 % 44.1   43.2   42.6    Platelets 150 - 450 x10E3/uL 183   186   186      Lab Results  Component Value Date/Time   VD25OH 36.1 02/03/2018 12:00 AM    Clinical ASCVD: No  The 10-year ASCVD risk score (Arnett DK, et al., 2019) is: 46.5%   Values used to calculate the score:     Age: 59 years     Sex: Male     Is Non-Hispanic African American: Yes     Diabetic: Yes     Tobacco smoker: Yes     Systolic Blood Pressure: 425 mmHg     Is BP treated: Yes     HDL Cholesterol: 72 mg/dL     Total Cholesterol: 197 mg/dL       12/13/2020    9:09 AM  12/13/2020    8:36 AM 07/12/2019    8:54 AM  Depression screen PHQ 2/9  Decreased Interest 0 0 0  Down, Depressed, Hopeless 0 0 0  PHQ - 2 Score 0 0 0  Altered sleeping   0  Tired, decreased energy   0  Change in appetite   0  Feeling bad or failure about yourself    0  Trouble concentrating   0  Moving slowly or fidgety/restless   0  Suicidal thoughts   0  PHQ-9 Score   0  Difficult doing work/chores   Not difficult at all      Social History   Tobacco Use  Smoking Status Never  Smokeless Tobacco Never   BP Readings from Last 3 Encounters:  10/03/21 122/70  07/10/21 134/72  06/18/21 122/74   Pulse Readings from Last 3 Encounters:  10/03/21 (!) 59  07/10/21 65  06/18/21 61   Wt Readings from Last 3 Encounters:  10/03/21 205 lb 3.2 oz (93.1 kg)  07/10/21 203 lb 9.6 oz (92.4 kg)  06/18/21 200 lb 12.8 oz (91.1 kg)   BMI Readings from Last 3 Encounters:  10/03/21 31.20 kg/m  07/10/21 31.32 kg/m  06/18/21 30.53 kg/m    Assessment/Interventions: Review of patient past medical history, allergies, medications, health status, including review of consultants reports, laboratory and other test data, was performed as part of comprehensive evaluation and provision of chronic care management services.   SDOH:  (Social Determinants of Health) assessments and interventions performed: Yes SDOH Interventions    Flowsheet Row Most Recent Value  SDOH Interventions   Financial Strain Interventions --  [Patient assistance application completed]      SDOH Screenings   Alcohol Screen: Not on file  Depression (PHQ2-9): Low Risk    PHQ-2 Score: 0  Financial Resource Strain: High Risk   Difficulty of Paying Living Expenses: Hard  Food Insecurity: No Food Insecurity   Worried About Charity fundraiser in the Last Year: Never true   Ran Out of Food in the Last Year: Never true  Housing: Not on  file  Physical Activity: Inactive   Days of Exercise per Week: 0 days   Minutes of  Exercise per Session: 0 min  Social Connections: Not on file  Stress: No Stress Concern Present   Feeling of Stress : Not at all  Tobacco Use: Low Risk    Smoking Tobacco Use: Never   Smokeless Tobacco Use: Never   Passive Exposure: Not on file  Transportation Needs: No Transportation Needs   Lack of Transportation (Medical): No   Lack of Transportation (Non-Medical): No    CCM Care Plan  No Known Allergies  Medications Reviewed Today     Reviewed by Mayford Knife, [facility] (Pharmacist) on 11/15/21 at [phone]  Med List Status: <None>   Medication Order Taking? Sig Documenting Provider Last Dose Status Informant  amLODipine (NORVASC) 5 MG tablet [id number] No Take 1 tablet (5 mg total) by mouth daily. Glendale Chard, MD Taking Active   Blood Glucose Monitoring Suppl (ONE TOUCH ULTRA 2) w/Device KIT [id number] No Use as directed to check blood sugars 1 time per day dx: e11.65 Glendale Chard, MD Taking Active   dapagliflozin propanediol (FARXIGA) 10 MG TABS tablet [id number] No Take 1 tablet (10 mg total) by mouth daily. Glendale Chard, MD Taking Active   glucose blood (ONE TOUCH ULTRA TEST) test strip [id number] No Use as instructed to check blood sugars 1 time per day dx: e11.65 dx: e11.22 Glendale Chard, MD Taking Active   sildenafil (REVATIO) 20 MG tablet [id number] No Take 1 tablet (20 mg total) by mouth as needed. MAX 3 TO 5 TABLETS PER DAY Glendale Chard, MD Taking Active   sitaGLIPtin (JANUVIA) 100 MG tablet [id number] No Take 1 tablet (100 mg total) by mouth daily. Glendale Chard, MD Taking Active   telmisartan-hydrochlorothiazide (MICARDIS HCT) 80-12.5 MG tablet [id number] No Take 1 tablet by mouth daily. 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   Snoring 10/14/2017   Nocturia more than twice per night 10/14/2017   CKD (chronic kidney disease) stage 2, GFR 60-89 ml/min 10/14/2017   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 Welshans is taking care of his mother everyday and that has changed his medication routine.   Pharmacist Clinical Goal(s):  Patient will achieve adherence to monitoring guidelines and medication adherence to achieve therapeutic efficacy through collaboration with PharmD and provider.   Interventions: 1:1 collaboration with Glendale Chard, MD regarding development and update of comprehensive plan of care as evidenced by provider attestation and co-signature Inter-disciplinary care team  collaboration (see longitudinal plan of care) Comprehensive medication review performed; medication list updated in electronic medical record  Hyperlipidemia: (LDL goal < 55) -Uncontrolled -Current treatment: Atorvastatin 40 mg tablet once per day Appropriate, Query effective,  He took his last pill yesterday  -Current dietary patterns: will discuss in more detail during next office visit  -Current exercise habits: will discuss during next office visit  -Educated on Cholesterol goals;  Benefits of statin for ASCVD risk  reduction; -He has been taking his medication daily at the same time each day -Patient agreed to have his medication increased  -Collaborated with PCP team and recommended patient be started on Atorvastatin 80 mg tablet once per day   Diabetes (A1c goal <7%) -Uncontrolled -Current medications: Farxiga 10 mg tablet once per day Appropriate, Effective, Safe, Query Accessible Farxiga through patient assistance around the first week of March, for a 90 day supply Januvia 100 mg tablet once per day Appropriate, Effective, Safe, Query Accessible Pending delivery, patient is approved for patient assistance -Medications previously tried: Metformin - allergy, did not agree with his system  -Current home glucose readings: he is still checking his BS at least three days out of the week and he writes down his readings in his log book fasting glucose: 140, 160, 180 drop down to 140, now 140-160 -Denies hypoglycemic/hyperglycemic symptoms -Current meal patterns: he is eating more fruits and vegetables, sometimes he does not have an appetite, since his mother has had a stroke on 10/31/2021 drinks: he is drinking more water -Educated on A1c and blood sugar goals; -Congratulated him on sticking with taking his medication -Counseled to check feet daily and get yearly eye exams -Patient was given two boxes of Januvia samples for a total of 28 days, Lot # [id number], EXP: MAY 2025, LOT [id number] EXP FEB 2025 -Called patient to let he know that samples are available in the front.  -Recommended to continue current medication  Patient Goals/Self-Care Activities Patient will:  - take medications as prescribed as evidenced by patient report and record review collaborate with provider on medication access solutions  Follow Up Plan: The patient has been provided with contact information for the care management team and has been advised to call with any health related questions or concerns.       Medication  Assistance:  Januvia obtained through 2023 medication assistance program.  Enrollment ends 07/2022  Compliance/Adherence/Medication fill history: Care Gaps: COVID-19 Vaccine Fecal DNA Cologuard   Star-Rating Drugs: Farxiga 10 mg tablet Januvia 100 mg tablet   Patient's preferred pharmacy is:  [facility], [place] [address] [place] [address] Phone: [phone] Fax: [facility], [place] [address] [place] [address] Phone: [phone] Fax: [phone]  Uses pill box? No - patient keeps up with his medications  Pt endorses 95% compliance  We discussed: Benefits of medication synchronization, packaging and delivery as well as enhanced pharmacist oversight with [employer]. Patient decided to: Continue current medication management strategy  Care Plan and Follow Up Patient Decision:  Patient agrees to Care Plan and Follow-up.  Plan: The patient has been provided with contact information for the care management team and has been advised to call with any health related questions or concerns.   [name], CPP, PharmD Clinical Pharmacist Practitioner [facility] [phone]

## 2021-11-20 NOTE — Patient Instructions (Addendum)
Visit Information It was great speaking with you today!  Please let me know if you have any questions about our visit.   Goals Addressed             This Visit's Progress    Manage My Medicine       Timeframe:  Long-Range Goal Priority:  High             Expected End Date:                       Follow Up Date 03/14/2022  In progress: - call for medicine refill 2 or 3 days before it runs out - call if I am sick and can't take my medicine - keep a list of all the medicines I take; vitamins and herbals too    Why is this important?   These steps will help you keep on track with your medicines.  Please call if you have any other questions         Patient Care Plan: CCM Pharmacy Care Plan     Problem Identified: HTN, DM II   Priority: High     Long-Range Goal: Disease Management   Recent Progress: On track  Priority: High  Note:   Current Barriers:  Mr. Bong is taking care of his mother everyday and that has changed his medication routine.   Pharmacist Clinical Goal(s):  Patient will achieve adherence to monitoring guidelines and medication adherence to achieve therapeutic efficacy through collaboration with PharmD and provider.   Interventions: 1:1 collaboration with Dorothyann Peng, MD regarding development and update of comprehensive plan of care as evidenced by provider attestation and co-signature Inter-disciplinary care team collaboration (see longitudinal plan of care) Comprehensive medication review performed; medication list updated in electronic medical record  Hyperlipidemia: (LDL goal < 55) -Uncontrolled -Current treatment: Atorvastatin 40 mg tablet once per day Appropriate, Query effective,  He took his last pill yesterday  -Current dietary patterns: will discuss in more detail during next office visit  -Current exercise habits: will discuss during next office visit  -Educated on Cholesterol goals;  Benefits of statin for ASCVD risk  reduction; -He has been taking his medication daily at the same time each day -Patient agreed to have his medication increased  -Collaborated with PCP team and recommended patient be started on Atorvastatin 80 mg tablet once per day   Diabetes (A1c goal <7%) -Uncontrolled -Current medications: Farxiga 10 mg tablet once per day Appropriate, Effective, Safe, Query Accessible Farxiga through patient assistance around the first week of March, for a 90 day supply Januvia 100 mg tablet once per day Appropriate, Effective, Safe, Query Accessible Pending delivery, patient is approved for patient assistance -Medications previously tried: Metformin - allergy, did not agree with his system  -Current home glucose readings: he is still checking his BS at least three days out of the week and he writes down his readings in his log book fasting glucose: 140, 160, 180 drop down to 140, now 140-160 -Denies hypoglycemic/hyperglycemic symptoms -Current meal patterns: he is eating more fruits and vegetables, sometimes he does not have an appetite, since his mother has had a stroke on 10/31/2021 drinks: he is drinking more water -Educated on A1c and blood sugar goals; -Congratulated him on sticking with taking his medication -Counseled to check feet daily and get yearly eye exams -Recommended to continue current medication  Patient Goals/Self-Care Activities Patient will:  - take medications as prescribed as evidenced by patient  report and record review collaborate with provider on medication access solutions  Follow Up Plan: The patient has been provided with contact information for the care management team and has been advised to call with any health related questions or concerns.       Patient agreed to services and verbal consent obtained.   The patient verbalized understanding of instructions, educational materials, and care plan provided today and agreed to receive a mailed copy of patient  instructions, educational materials, and care plan.   [name], PharmD Clinical Pharmacist [facility] [phone]

## 2021-12-08 DIAGNOSIS — E782 Mixed hyperlipidemia: Secondary | ICD-10-CM | POA: Diagnosis not present

## 2021-12-08 DIAGNOSIS — E1122 Type 2 diabetes mellitus with diabetic chronic kidney disease: Secondary | ICD-10-CM

## 2021-12-08 DIAGNOSIS — N182 Chronic kidney disease, stage 2 (mild): Secondary | ICD-10-CM | POA: Diagnosis not present

## 2021-12-19 ENCOUNTER — Ambulatory Visit: Payer: Medicare HMO

## 2021-12-19 ENCOUNTER — Ambulatory Visit: Payer: Medicare HMO | Admitting: Internal Medicine

## 2021-12-23 ENCOUNTER — Telehealth: Payer: Self-pay

## 2021-12-23 ENCOUNTER — Other Ambulatory Visit (INDEPENDENT_AMBULATORY_CARE_PROVIDER_SITE_OTHER): Payer: Medicare HMO

## 2021-12-23 DIAGNOSIS — Z20822 Contact with and (suspected) exposure to covid-19: Secondary | ICD-10-CM

## 2021-12-23 DIAGNOSIS — R509 Fever, unspecified: Secondary | ICD-10-CM

## 2021-12-23 LAB — POC INFLUENZA A&B (BINAX/QUICKVUE)
Influenza A, POC: NEGATIVE
Influenza B, POC: NEGATIVE

## 2021-12-23 LAB — POC COVID19 BINAXNOW: SARS Coronavirus 2 Ag: NEGATIVE

## 2021-12-23 NOTE — Telephone Encounter (Signed)
Spoke with patient, per patient request he wanted to cancel all future appointments. Pt aware, all future appointments cancelled. Pt told to still wear mask around others & we will give him a call once his PCR results come in.

## 2021-12-24 ENCOUNTER — Other Ambulatory Visit: Payer: Self-pay

## 2021-12-24 ENCOUNTER — Emergency Department (HOSPITAL_COMMUNITY): Payer: Medicare HMO

## 2021-12-24 ENCOUNTER — Encounter (HOSPITAL_COMMUNITY): Payer: Self-pay | Admitting: Family Medicine

## 2021-12-24 ENCOUNTER — Inpatient Hospital Stay (HOSPITAL_COMMUNITY)
Admission: EM | Admit: 2021-12-24 | Discharge: 2021-12-27 | DRG: 871 | Disposition: A | Discharge status: Home / Self Care | Payer: Medicare HMO | Attending: Internal Medicine | Admitting: Internal Medicine

## 2021-12-24 DIAGNOSIS — E785 Hyperlipidemia, unspecified: Secondary | ICD-10-CM | POA: Diagnosis not present

## 2021-12-24 DIAGNOSIS — I10 Essential (primary) hypertension: Secondary | ICD-10-CM

## 2021-12-24 DIAGNOSIS — J189 Pneumonia, unspecified organism: Secondary | ICD-10-CM

## 2021-12-24 DIAGNOSIS — E1122 Type 2 diabetes mellitus with diabetic chronic kidney disease: Secondary | ICD-10-CM | POA: Diagnosis present

## 2021-12-24 DIAGNOSIS — N179 Acute kidney failure, unspecified: Secondary | ICD-10-CM

## 2021-12-24 DIAGNOSIS — A419 Sepsis, unspecified organism: Secondary | ICD-10-CM | POA: Diagnosis not present

## 2021-12-24 DIAGNOSIS — N182 Chronic kidney disease, stage 2 (mild): Secondary | ICD-10-CM | POA: Diagnosis present

## 2021-12-24 DIAGNOSIS — R059 Cough, unspecified: Secondary | ICD-10-CM | POA: Diagnosis not present

## 2021-12-24 DIAGNOSIS — E1165 Type 2 diabetes mellitus with hyperglycemia: Secondary | ICD-10-CM | POA: Diagnosis present

## 2021-12-24 DIAGNOSIS — R0981 Nasal congestion: Secondary | ICD-10-CM | POA: Diagnosis present

## 2021-12-24 DIAGNOSIS — Z8042 Family history of malignant neoplasm of prostate: Secondary | ICD-10-CM | POA: Diagnosis not present

## 2021-12-24 DIAGNOSIS — Z820 Family history of epilepsy and other diseases of the nervous system: Secondary | ICD-10-CM | POA: Diagnosis not present

## 2021-12-24 DIAGNOSIS — Z7984 Long term (current) use of oral hypoglycemic drugs: Secondary | ICD-10-CM | POA: Diagnosis not present

## 2021-12-24 DIAGNOSIS — Z79899 Other long term (current) drug therapy: Secondary | ICD-10-CM | POA: Diagnosis not present

## 2021-12-24 DIAGNOSIS — I129 Hypertensive chronic kidney disease with stage 1 through stage 4 chronic kidney disease, or unspecified chronic kidney disease: Secondary | ICD-10-CM | POA: Diagnosis not present

## 2021-12-24 DIAGNOSIS — Z20822 Contact with and (suspected) exposure to covid-19: Secondary | ICD-10-CM | POA: Diagnosis present

## 2021-12-24 DIAGNOSIS — R0602 Shortness of breath: Secondary | ICD-10-CM | POA: Diagnosis not present

## 2021-12-24 DIAGNOSIS — Z8249 Family history of ischemic heart disease and other diseases of the circulatory system: Secondary | ICD-10-CM | POA: Diagnosis not present

## 2021-12-24 DIAGNOSIS — R6520 Severe sepsis without septic shock: Secondary | ICD-10-CM | POA: Diagnosis not present

## 2021-12-24 HISTORY — DX: Pneumonia, unspecified organism: J18.9

## 2021-12-24 HISTORY — DX: Sepsis, unspecified organism: A41.9

## 2021-12-24 HISTORY — DX: Acute kidney failure, unspecified: N17.9

## 2021-12-24 LAB — TROPONIN I (HIGH SENSITIVITY)
Troponin I (High Sensitivity): 12 ng/L (ref <18)
Troponin I (High Sensitivity): 22 ng/L — ABNORMAL HIGH (ref <18)

## 2021-12-24 LAB — CBC WITH DIFFERENTIAL/PLATELET
Abs Immature Granulocytes: 0.21 10*3/uL — ABNORMAL HIGH (ref 0.00–0.07)
Basophils Absolute: 0.1 10*3/uL (ref 0.0–0.1)
Basophils Relative: 0 %
Eosinophils Absolute: 0 10*3/uL (ref 0.0–0.5)
Eosinophils Relative: 0 %
HCT: 41.2 % (ref 39.0–52.0)
Hemoglobin: 13.8 g/dL (ref 13.0–17.0)
Immature Granulocytes: 1 %
Lymphocytes Relative: 12 %
Lymphs Abs: 2.5 10*3/uL (ref 0.7–4.0)
MCH: 29.9 pg (ref 26.0–34.0)
MCHC: 33.5 g/dL (ref 30.0–36.0)
MCV: 89.2 fL (ref 80.0–100.0)
Monocytes Absolute: 1.7 10*3/uL — ABNORMAL HIGH (ref 0.1–1.0)
Monocytes Relative: 8 %
Neutro Abs: 16.7 10*3/uL — ABNORMAL HIGH (ref 1.7–7.7)
Neutrophils Relative %: 79 %
Platelets: 202 10*3/uL (ref 150–400)
RBC: 4.62 MIL/uL (ref 4.22–5.81)
RDW: 14 % (ref 11.5–15.5)
WBC: 21.2 10*3/uL — ABNORMAL HIGH (ref 4.0–10.5)
nRBC: 0 % (ref 0.0–0.2)

## 2021-12-24 LAB — BASIC METABOLIC PANEL
Anion gap: 9 (ref 5–15)
BUN: 13 mg/dL (ref 8–23)
CO2: 22 mmol/L (ref 22–32)
Calcium: 9 mg/dL (ref 8.9–10.3)
Chloride: 104 mmol/L (ref 98–111)
Creatinine, Ser: 1.74 mg/dL — ABNORMAL HIGH (ref 0.61–1.24)
GFR, Estimated: 41 mL/min — ABNORMAL LOW (ref >60)
Glucose, Bld: 321 mg/dL — ABNORMAL HIGH (ref 70–99)
Potassium: 3.6 mmol/L (ref 3.5–5.1)
Sodium: 135 mmol/L (ref 135–145)

## 2021-12-24 LAB — PROTIME-INR
INR: 1.2 (ref 0.8–1.2)
Prothrombin Time: 14.9 seconds (ref 11.4–15.2)

## 2021-12-24 LAB — RESP PANEL BY RT-PCR (FLU A&B, COVID) ARPGX2
Influenza A by PCR: NEGATIVE
Influenza B by PCR: NEGATIVE
SARS Coronavirus 2 by RT PCR: NEGATIVE

## 2021-12-24 LAB — STREP PNEUMONIAE URINARY ANTIGEN: Strep Pneumo Urinary Antigen: NEGATIVE

## 2021-12-24 LAB — NOVEL CORONAVIRUS, NAA: SARS-CoV-2, NAA: NOT DETECTED

## 2021-12-24 LAB — CBG MONITORING, ED: Glucose-Capillary: 247 mg/dL — ABNORMAL HIGH (ref 70–99)

## 2021-12-24 LAB — LACTIC ACID, PLASMA: Lactic Acid, Venous: 2 mmol/L (ref 0.5–1.9)

## 2021-12-24 LAB — APTT: aPTT: 39 seconds — ABNORMAL HIGH (ref 24–36)

## 2021-12-24 MED ORDER — ACETAMINOPHEN 650 MG RE SUPP: 650.0000 mg | Freq: Four times a day (QID) | RECTAL | Status: DC | PRN

## 2021-12-24 MED ORDER — ACETAMINOPHEN 325 MG PO TABS
650.0000 mg | ORAL_TABLET | Freq: Four times a day (QID) | ORAL | Status: DC | PRN
  Administered 2021-12-25 – 2021-12-26 (×2): 650 mg via ORAL
  Filled 2021-12-24 (×2): qty 2

## 2021-12-24 MED ORDER — LACTATED RINGERS IV SOLN: INTRAVENOUS | Status: AC

## 2021-12-24 MED ORDER — AZITHROMYCIN 500 MG PO TABS
500.0000 mg | ORAL_TABLET | Freq: Every day | ORAL | Status: DC
  Administered 2021-12-25 – 2021-12-27 (×3): 500 mg via ORAL
  Filled 2021-12-24: qty 2
  Filled 2021-12-24 (×2): qty 1

## 2021-12-24 MED ORDER — SODIUM CHLORIDE 0.9 % IV SOLN
500.0000 mg | Freq: Once | INTRAVENOUS | Status: AC
  Administered 2021-12-24: 500 mg via INTRAVENOUS
  Filled 2021-12-24: qty 5

## 2021-12-24 MED ORDER — HYDROCODONE-ACETAMINOPHEN 5-325 MG PO TABS
1.0000 | ORAL_TABLET | ORAL | Status: DC | PRN
  Administered 2021-12-24: 1 via ORAL
  Administered 2021-12-25 – 2021-12-27 (×5): 2 via ORAL
  Filled 2021-12-24 (×2): qty 2
  Filled 2021-12-24: qty 1
  Filled 2021-12-24 (×3): qty 2

## 2021-12-24 MED ORDER — SODIUM CHLORIDE 0.9 % IV SOLN
2.0000 g | INTRAVENOUS | Status: DC
  Administered 2021-12-25 – 2021-12-27 (×3): 2 g via INTRAVENOUS
  Filled 2021-12-24 (×3): qty 20

## 2021-12-24 MED ORDER — SODIUM CHLORIDE 0.9 % IV SOLN
1.0000 g | Freq: Once | INTRAVENOUS | Status: AC
  Administered 2021-12-24: 1 g via INTRAVENOUS
  Filled 2021-12-24: qty 10

## 2021-12-24 MED ORDER — ATORVASTATIN CALCIUM 80 MG PO TABS
80.0000 mg | ORAL_TABLET | Freq: Every day | ORAL | Status: DC
  Administered 2021-12-25 – 2021-12-27 (×3): 80 mg via ORAL
  Filled 2021-12-24 (×3): qty 1

## 2021-12-24 MED ORDER — ENOXAPARIN SODIUM 40 MG/0.4ML IJ SOSY
40.0000 mg | PREFILLED_SYRINGE | INTRAMUSCULAR | Status: DC
  Administered 2021-12-24 – 2021-12-26 (×3): 40 mg via SUBCUTANEOUS
  Filled 2021-12-24 (×3): qty 0.4

## 2021-12-24 MED ORDER — ONDANSETRON HCL 4 MG/2ML IJ SOLN: 4.0000 mg | Freq: Four times a day (QID) | INTRAMUSCULAR | Status: DC | PRN

## 2021-12-24 MED ORDER — ONDANSETRON HCL 4 MG PO TABS: 4.0000 mg | ORAL_TABLET | Freq: Four times a day (QID) | ORAL | Status: DC | PRN

## 2021-12-24 MED ORDER — HYDRALAZINE HCL 25 MG PO TABS: 25.0000 mg | ORAL_TABLET | Freq: Four times a day (QID) | ORAL | Status: DC | PRN

## 2021-12-24 MED ORDER — LACTATED RINGERS IV SOLN: INTRAVENOUS | Status: DC

## 2021-12-24 MED ORDER — INSULIN ASPART 100 UNIT/ML IJ SOLN
0.0000 [IU] | Freq: Three times a day (TID) | INTRAMUSCULAR | Status: DC
  Administered 2021-12-25: 3 [IU] via SUBCUTANEOUS
  Administered 2021-12-25: 2 [IU] via SUBCUTANEOUS
  Administered 2021-12-25: 3 [IU] via SUBCUTANEOUS
  Administered 2021-12-26 – 2021-12-27 (×4): 2 [IU] via SUBCUTANEOUS

## 2021-12-24 NOTE — ED Triage Notes (Addendum)
Pt. Stated, Warren [name] had a severe cough with congestion on Sunday . I saw Dr, Lelon Perla yesterday and had a COVID test, flu test and it was all negative.and Im still coughing really bad. I have chest pain when I cough

## 2021-12-24 NOTE — ED Provider Notes (Signed)
Received signout from previous provider, please see his note for complete H&P.  This is a 76 year old male presenting with complaint of fever congestion cough ongoing for the past 3 days.  Today he does have elevated temperature of 100.7, a white count of 21.2, and elevated lactic acid of 2.0.  Chest x-ray shows focal infiltrate.  Findings consistent with sepsis likely from a pulmonary source.  Patient was given Rocephin and Zithromax, and IV fluid.  Code sepsis initiated.  Patient does have a bump in troponin likely demand ischemia.  Also has evidence of AKI.  I have considered PE but felt it is less likely.  I appreciate consultation from [facility] hospitalist who will see and will admit patient for further management of his condition.  I have independently reviewed and interpreted labs, EKG, and imaging and agree with radiologist interpretation.  I have reassessed this patient and he is doing better.  .Critical Care Performed by: Fayrene Helper, PA-C Authorized by: Fayrene Helper, PA-C   Critical care provider statement:    Critical care time (minutes):  30   Critical care was time spent personally by me on the following activities:  Development of treatment plan with patient or surrogate, discussions with consultants, evaluation of patient's response to treatment, examination of patient, ordering and review of laboratory studies, ordering and review of radiographic studies, ordering and performing treatments and interventions, pulse oximetry, re-evaluation of patient's condition and review of old charts   BP (!) 141/74   Pulse 78   Temp (!) 100.7 F (38.2 C) (Oral)   Resp (!) 21   Ht 5\' 10"  (1.778 m)   Wt 88.9 kg   SpO2 97%   BMI 28.12 kg/m   Results for orders placed or performed during the hospital encounter of 12/24/21  Resp Panel by RT-PCR (Flu A&B, Covid) Nasopharyngeal Swab   Specimen: Nasopharyngeal Swab; Nasopharyngeal(NP) swabs in vial transport medium  Result Value Ref Range   SARS  Coronavirus 2 by RT PCR NEGATIVE NEGATIVE   Influenza A by PCR NEGATIVE NEGATIVE   Influenza B by PCR NEGATIVE NEGATIVE  CBC with Differential  Result Value Ref Range   WBC 21.2 (H) 4.0 - 10.5 K/uL   RBC 4.62 4.22 - 5.81 MIL/uL   Hemoglobin 13.8 13.0 - 17.0 g/dL   HCT 12/26/21 45.8 - 09.9 %   MCV 89.2 80.0 - 100.0 fL   MCH 29.9 26.0 - 34.0 pg   MCHC 33.5 30.0 - 36.0 g/dL   RDW 83.3 82.5 - 05.3 %   Platelets 202 150 - 400 K/uL   nRBC 0.0 0.0 - 0.2 %   Neutrophils Relative % 79 %   Neutro Abs 16.7 (H) 1.7 - 7.7 K/uL   Lymphocytes Relative 12 %   Lymphs Abs 2.5 0.7 - 4.0 K/uL   Monocytes Relative 8 %   Monocytes Absolute 1.7 (H) 0.1 - 1.0 K/uL   Eosinophils Relative 0 %   Eosinophils Absolute 0.0 0.0 - 0.5 K/uL   Basophils Relative 0 %   Basophils Absolute 0.1 0.0 - 0.1 K/uL   Immature Granulocytes 1 %   Abs Immature Granulocytes 0.21 (H) 0.00 - 0.07 K/uL  Basic metabolic panel  Result Value Ref Range   Sodium 135 135 - 145 mmol/L   Potassium 3.6 3.5 - 5.1 mmol/L   Chloride 104 98 - 111 mmol/L   CO2 22 22 - 32 mmol/L   Glucose, Bld 321 (H) 70 - 99 mg/dL   BUN 13 8 -  23 mg/dL   Creatinine, Ser 3.64 (H) 0.61 - 1.24 mg/dL   Calcium 9.0 8.9 - 68.0 mg/dL   GFR, Estimated 41 (L) >60 mL/min   Anion gap 9 5 - 15  Lactic acid, plasma  Result Value Ref Range   Lactic Acid, Venous 2.0 (HH) 0.5 - 1.9 mmol/L  Protime-INR  Result Value Ref Range   Prothrombin Time 14.9 11.4 - 15.2 seconds   INR 1.2 0.8 - 1.2  APTT  Result Value Ref Range   aPTT 39 (H) 24 - 36 seconds  CBG monitoring, ED  Result Value Ref Range   Glucose-Capillary 247 (H) 70 - 99 mg/dL  Troponin I (High Sensitivity)  Result Value Ref Range   Troponin I (High Sensitivity) 12 <18 ng/L  Troponin I (High Sensitivity)  Result Value Ref Range   Troponin I (High Sensitivity) 22 (H) <18 ng/L   DG Chest 2 View  Result Date: 12/24/2021 CLINICAL DATA:  76 year old male with cough and congestion, shortness of breath. EXAM:  CHEST - 2 VIEW COMPARISON:  None Available. FINDINGS: Somewhat low lung volumes. Right lung base consolidation, probably in the lateral segment of the right middle lobe and less likely in the lower lobe. Abnormality tracks toward the right hilum. Hilar and mediastinal contours seem to remain normal. Visualized tracheal air column is within normal limits. No superimposed pneumothorax, pulmonary edema or pleural effusion. Left lung appears negative. No acute osseous abnormality identified. Negative visible bowel gas. IMPRESSION: Right lung base consolidation compatible with Pneumonia, likely in the middle lobe. No pleural effusion. Followup PA and lateral chest X-ray is recommended in 3-4 weeks following trial of antibiotic therapy to ensure resolution and exclude underlying malignancy. Electronically Signed   By: Odessa Fleming M.D.   On: 12/24/2021 10:59       Fayrene Helper, PA-C 12/24/21 1731    Sloan Leiter, DO 12/25/21 0018

## 2021-12-24 NOTE — ED Provider Notes (Signed)
[facility] EMERGENCY DEPARTMENT Provider Note   CSN: [id number] Arrival date & time: 12/24/21  0944     History  Chief Complaint  Patient presents with   Cough   Nasal Congestion    Warren [name] is a 76 y.o. male.   Cough Associated symptoms: fever   Associated symptoms: no chills, no diaphoresis, no headaches, no rhinorrhea and no sore throat    76 year old male presents emergency department 3-day history of cough, shortness of breath, and chest pain.  Patient went to his PCP 2 days ago for assessment.  They performed a respiratory viral panel which was negative.  Patient continued to have increase in his symptoms as well as increase in temperature with readings of above 100 at home which prompted his visit to the emergency department today.  Patient's chest pain is exacerbated with cough.  He feels that his right side, and he feels sharp/stabbing in nature.  Cough is now associated with production of thick sputum.  Patient cares for his wife who has a history of stroke and was concerned about leaving her at home without his care.  He denies recent hospital visit or antibiotic usage.  He currently denies headache, lightheadedness, abdominal pain, nausea/vomiting/diarrhea, urinary symptoms, change in bowel habits.  Past medical history significant for chronic kidney disease, diabetes mellitus, hypertension.   Home Medications Prior to Admission medications   Medication Sig Start Date End Date Taking? Authorizing Provider  amLODipine (NORVASC) 5 MG tablet Take 1 tablet (5 mg total) by mouth daily. 06/03/21 06/03/22  Glendale Chard, MD  atorvastatin (LIPITOR) 80 MG tablet Take 1 tablet (80 mg total) by mouth daily. 11/15/21   Glendale Chard, MD  Blood Glucose Monitoring Suppl (ONE TOUCH ULTRA 2) w/Device KIT Use as directed to check blood sugars 1 time per day dx: e11.65 01/14/21   Glendale Chard, MD  dapagliflozin propanediol (FARXIGA) 10 MG TABS tablet Take 1 tablet  (10 mg total) by mouth daily. 06/26/21   Glendale Chard, MD  glucose blood (ONE TOUCH ULTRA TEST) test strip Use as instructed to check blood sugars 1 time per day dx: e11.65 dx: e11.22 01/14/21   Glendale Chard, MD  sildenafil (REVATIO) 20 MG tablet Take 1 tablet (20 mg total) by mouth as needed. MAX 3 TO 5 TABLETS PER DAY 10/02/21   Glendale Chard, MD  sitaGLIPtin (JANUVIA) 100 MG tablet Take 1 tablet (100 mg total) by mouth daily. 06/03/21   Glendale Chard, MD  telmisartan-hydrochlorothiazide (MICARDIS HCT) 80-12.5 MG tablet Take 1 tablet by mouth daily.    [provider]      Allergies    Patient has no known allergies.    Review of Systems   Review of Systems  Constitutional:  Positive for fever. Negative for chills and diaphoresis.  HENT:  Negative for congestion, drooling, postnasal drip, rhinorrhea, sinus pain and sore throat.   Respiratory:  Positive for cough.   Gastrointestinal:  Negative for abdominal pain, diarrhea, nausea and vomiting.  Genitourinary:  Negative for dysuria and urgency.  Neurological:  Negative for dizziness, syncope and headaches.  All other systems reviewed and are negative.  Physical Exam Updated Vital Signs BP (!) 160/79 (BP Location: Right Arm)   Pulse 87   Temp (!) 100.7 F (38.2 C) (Oral)   Resp (!) 26   Ht _0  (1.778 m)   Wt 88.9 kg   SpO2 96%   BMI 28.12 kg/m  Physical Exam Vitals and nursing note reviewed.  Constitutional:      Appearance: Normal appearance. He is ill-appearing. He is not toxic-appearing.  HENT:     Head: Normocephalic and atraumatic.     Right Ear: Tympanic membrane normal.     Left Ear: Tympanic membrane normal.     Nose: Nose normal. No congestion or rhinorrhea.     Mouth/Throat:     Mouth: Mucous membranes are moist.     Pharynx: Oropharynx is clear. No oropharyngeal exudate or posterior oropharyngeal erythema.  Eyes:     General:        Right eye: No discharge.        Left eye: No discharge.      Conjunctiva/sclera: Conjunctivae normal.  Cardiovascular:     Rate and Rhythm: Normal rate and regular rhythm.     Pulses: Normal pulses.     Heart sounds: Normal heart sounds.  Pulmonary:     Breath sounds: Rales present.     Comments: Rales and decreased breath sounds noted in right lower lung field.  Patient is mildly tachypneic upon exam with respiratory rate of 26.  He is not hypoxic on room air.  Chest:     Chest wall: No tenderness.  Abdominal:     General: Abdomen is flat. Bowel sounds are normal.     Palpations: Abdomen is soft.     Tenderness: There is no abdominal tenderness. There is no guarding.  Musculoskeletal:        General: No tenderness. Normal range of motion.     Cervical back: Normal range of motion.     Right lower leg: No edema.     Left lower leg: No edema.  Skin:    General: Skin is warm and dry.     Capillary Refill: Capillary refill takes less than 2 seconds.  Neurological:     General: No focal deficit present.     Mental Status: He is alert and oriented to person, place, and time.  Psychiatric:        Mood and Affect: Mood normal.        Behavior: Behavior normal.    ED Results / Procedures / Treatments   Labs (all labs ordered are listed, but only abnormal results are displayed) Labs Reviewed  CBC WITH DIFFERENTIAL/PLATELET - Abnormal; Notable for the following components:      Result Value   WBC 21.2 (*)    Neutro Abs 16.7 (*)    Monocytes Absolute 1.7 (*)    Abs Immature Granulocytes 0.21 (*)    All other components within normal limits  BASIC METABOLIC PANEL - Abnormal; Notable for the following components:   Glucose, Bld 321 (*)    Creatinine, Ser 1.74 (*)    GFR, Estimated 41 (*)    All other components within normal limits  APTT - Abnormal; Notable for the following components:   aPTT 39 (*)    All other components within normal limits  CBG MONITORING, ED - Abnormal; Notable for the following components:   Glucose-Capillary 247  (*)    All other components within normal limits  TROPONIN I (HIGH SENSITIVITY) - Abnormal; Notable for the following components:   Troponin I (High Sensitivity) 22 (*)    All other components within normal limits  RESP PANEL BY RT-PCR (FLU A&B, COVID) ARPGX2  CULTURE, BLOOD (ROUTINE X 2)  CULTURE, BLOOD (ROUTINE X 2)  URINE CULTURE  EXPECTORATED SPUTUM ASSESSMENT W GRAM STAIN, RFLX TO RESP C  PROTIME-INR  LACTIC ACID, PLASMA  LACTIC ACID, PLASMA  URINALYSIS, ROUTINE W REFLEX MICROSCOPIC  TROPONIN I (HIGH SENSITIVITY)    EKG EKG Interpretation  Date/Time:  Tuesday Dec 24 2021 14:04:49 EDT Ventricular Rate:  83 PR Interval:  160 QRS Duration: 74 QT Interval:  340 QTC Calculation: 399 R Axis:   8 Text Interpretation: Normal sinus rhythm Minimal voltage criteria for LVH, may be normal variant ( R in aVL ) Borderline ECG No previous ECGs available no prior ECG for comparison. No STEMI Confirmed by Antony Blackbird [phone]) on 12/24/2021 3:04:13 PM  Radiology DG Chest 2 View  Result Date: 12/24/2021 CLINICAL DATA:  76 year old male with cough and congestion, shortness of breath. EXAM: CHEST - 2 VIEW COMPARISON:  None Available. FINDINGS: Somewhat low lung volumes. Right lung base consolidation, probably in the lateral segment of the right middle lobe and less likely in the lower lobe. Abnormality tracks toward the right hilum. Hilar and mediastinal contours seem to remain normal. Visualized tracheal air column is within normal limits. No superimposed pneumothorax, pulmonary edema or pleural effusion. Left lung appears negative. No acute osseous abnormality identified. Negative visible bowel gas. IMPRESSION: Right lung base consolidation compatible with Pneumonia, likely in the middle lobe. No pleural effusion. Followup PA and lateral chest X-ray is recommended in 3-4 weeks following trial of antibiotic therapy to ensure resolution and exclude underlying malignancy. Electronically Signed   By:  Genevie Ann M.D.   On: 12/24/2021 10:59    Procedures Procedures    Medications Ordered in ED Medications  lactated ringers infusion (has no administration in time range)  cefTRIAXone (ROCEPHIN) 1 g in sodium chloride 0.9 % 100 mL IVPB (has no administration in time range)  azithromycin (ZITHROMAX) 500 mg in sodium chloride 0.9 % 250 mL IVPB (has no administration in time range)    ED Course/ Medical Decision Making/ A&P                           Medical Decision Making Amount and/or Complexity of Data Reviewed Labs: ordered.  Risk Prescription drug management.   This patient presents to the ED for concern of cough, fever, this involves an extensive number of treatment options, and is a complaint that carries with it a high risk of complications and morbidity.  The differential diagnosis includes pneumonia, sepsis, PE, MI, pneumothorax, COPD exacerbation, GERD   Co morbidities that complicate the patient evaluation  chronic kidney disease, diabetes mellitus, hypertension.   Additional history obtained:  Additional history obtained from [place] performed on 10/03/2021 External records from outside source obtained and reviewed including creatinine of 1.11 and BUN of 12 with EGFR of 70.  Comparing for baseline kidney function   Lab Tests:  I Ordered, and personally interpreted labs.  The pertinent results include: APTT of 39, initial troponin of 15 with elevation to 22.  CBG of 247, creatinine of 1.74 and BUN of 13 with GFR of 41, WBC of 21.2.  At shift change blood cultures, lactic acid, UA, urine culture, expectorant sputum culture yet to be obtained.   Imaging Studies ordered:  I ordered imaging studies including chest x-ray I independently visualized and interpreted imaging which showed right lung base consolidation compatible with pneumonia, likely in the middle lobe.  No pleural effusion I agree with the radiologist interpretation  Cardiac Monitoring: / EKG:  The patient  was maintained on a cardiac monitor.  I personally viewed and interpreted the cardiac monitored which showed an underlying rhythm of:  Sinus rhythm with right heart strain noticed: S1Q3T3 pattern.   Consultations Obtained:  Consultation to hospitalist pending upon shift change  Problem List / ED Course / Critical interventions / Medication management  Cough, shortness of breath I ordered medication including:  azithromycin and ceftriaxone ordered for coverage of community-acquired pneumonia Maintenance fluids of lactated Ringer's Reevaluation of the patient after these medicines showed that the patient stayed the same I have reviewed the patients home medicines and have made adjustments as needed   Social Determinants of Health:  [place] caregiver of wife.  Denies tobacco, alcohol, illicit drug use.   Test / Admission - Considered:  Pneumonia Vital signs significant for hypertension with a blood pressure 160/79, temperature of 100.7 F, respiratory rate of 26.  Given this in conjunction with his abnormal laboratory studies, the patient qualifies for sepsis criteria.  Likely source being his lungs. Broad-spectrum antibiotic coverage given for community-acquired pneumonia given that the patient has had no recent hospital visits or living in a nursing home or antibiotic usage. I assume the patient will be admitted for IV antibiotic treatment.  Looks like he has an AKI present which aid in his admission.  At shift change, patient was handed off to Domenic Moras, PA-C         Final Clinical Impression(s) / ED Diagnoses Final diagnoses:  Sepsis with acute renal failure without septic shock, due to unspecified organism, unspecified acute renal failure type [facility])    Rx / DC Orders ED Discharge Orders     None         Wilnette Kales, [place] 12/24/21 1616    Tegeler, Gwenyth Allegra, MD 12/25/21 1213

## 2021-12-24 NOTE — H&P (Signed)
History and Physical    Patient: Breyson Kelm ACZ:[id number] DOB: 09-06-1945 DOA: 12/24/2021 DOS: the patient was seen and examined on 12/24/2021 PCP: Glendale Chard, MD  Patient coming from: Home  Chief Complaint:  Chief Complaint  Patient presents with   Cough   Nasal Congestion   HPI: Valentino Saavedra is a 76 y.o. male with medical history significant of hypertension, CKD stage II, diabetes mellitus type 2. Symptoms stared 5 days ago with coughing. Coughing worsened and he developed sharp pain in right chest especially when coughing. Symptoms have progressively worsened. He reports associated dyspnea. He used Chloraseptic which did not help with his symptoms. He reports fever with Tmax of 101.8 F with chills.   Review of Systems: As mentioned in the history of present illness. All other systems reviewed and are negative. Past Medical History:  Diagnosis Date   Chronic kidney disease    stage 2   Diabetes mellitus without complication (HCC)    Hypertension    Malaise and fatigue    Past Surgical History:  Procedure Laterality Date   surgical repair     metatarsal repair.    Social History:  reports that he has never smoked. He has never used smokeless tobacco. He reports current alcohol use of about 3.0 standard drinks per week. He reports current drug use. Drug: Marijuana.  No Known Allergies  Family History  Problem Relation Age of Onset   Hypertension Mother    Hypothyroidism Mother    Alzheimer's disease Father    Prostate cancer Father    Prostate cancer Brother     Prior to Admission medications   Medication Sig Start Date End Date Taking? Authorizing Provider  amLODipine (NORVASC) 5 MG tablet Take 1 tablet (5 mg total) by mouth daily. 06/03/21 06/03/22  Glendale Chard, MD  atorvastatin (LIPITOR) 80 MG tablet Take 1 tablet (80 mg total) by mouth daily. 11/15/21   Glendale Chard, MD  Blood Glucose Monitoring Suppl (ONE TOUCH ULTRA 2) w/Device KIT Use as directed to  check blood sugars 1 time per day dx: e11.65 01/14/21   Glendale Chard, MD  dapagliflozin propanediol (FARXIGA) 10 MG TABS tablet Take 1 tablet (10 mg total) by mouth daily. 06/26/21   Glendale Chard, MD  glucose blood (ONE TOUCH ULTRA TEST) test strip Use as instructed to check blood sugars 1 time per day dx: e11.65 dx: e11.22 01/14/21   Glendale Chard, MD  sildenafil (REVATIO) 20 MG tablet Take 1 tablet (20 mg total) by mouth as needed. MAX 3 TO 5 TABLETS PER DAY 10/02/21   Glendale Chard, MD  sitaGLIPtin (JANUVIA) 100 MG tablet Take 1 tablet (100 mg total) by mouth daily. 06/03/21   Glendale Chard, MD  telmisartan-hydrochlorothiazide (MICARDIS HCT) 80-12.5 MG tablet Take 1 tablet by mouth daily.    [provider]    Physical Exam: Vitals:   12/24/21 1156 12/24/21 1457 12/24/21 1500 12/24/21 1630  BP: (!) 163/87 (!) 160/79 137/86 (!) 141/74  Pulse: 78 87 88 78  Resp: 20 (!) 26 (!) 25 (!) 21  Temp: (!) 100.7 F (38.2 C)     TempSrc: Oral     SpO2: 100% 96% 96% 97%  Weight:      Height:       General exam: Appears calm and comfortable  Respiratory system: Clear to auscultation. Respiratory effort normal. Cardiovascular system: S1 & S2 heard, RRR. No murmurs, rubs, gallops or clicks. Gastrointestinal system: Abdomen is nondistended, soft and nontender. No organomegaly or masses  felt. Normal bowel sounds heard. Central nervous system: Alert and oriented. No focal neurological deficits. Musculoskeletal: No edema. No calf tenderness Skin: No cyanosis. No rashes Psychiatry: Judgement and insight appear normal. Mood & affect appropriate.   Data Reviewed:  Lactic acid of 2. Troponin of 12>22, glucose of 321,  Creatinine of 1.74, WBC of 21,200  Assessment and Plan:  Sepsis Present on admission. Patient with leukocytosis and fever. Source is pneumonia. Blood and urine cultures obtained. Empiric antibiotics initiated. -Continue antibiotics as mentioned below -Continue IV fluids  overnight -Obtain procalcitonin -Follow-up blood/urine cultures  Community acquired pneumonia RLL pneumonia On room air. Associated dyspnea and chest pain -Continue Ceftriaxone and azithromycin -Urine strep/legionella -Cultures as mentioned above -Incentive spirometer  AKI on CKD stage II Baseline creatinine of about 1.2. Creatinine of 1.74 on admission. In setting of sepsis. Given IV fluids. -Continue IV fluids -BMP in AM  Diabetes mellitus, type 2 Uncontrolled with hyperglycemia. Patient is on [place] as an outpatient. Hemoglobin A1C of 8.8% from February 2023 -Hold home regimen -SSI moderate scale  Primary hypertension Hold home amlodipine, telmesartan-hydrochlorothiazide overnight -Hydralazine PRN  Hyperlipidemia -Continue Lipitor 80 mg daily    Advance Care Planning: Full Code  Consults: None  Family Communication: Friend at bedside   Author: Cordelia Poche, MD 12/24/2021 5:29 PM  For on call review [email or web address].

## 2021-12-24 NOTE — ED Notes (Signed)
Pt. Stated now his chest is hurting. - EKG

## 2021-12-24 NOTE — ED Provider Triage Note (Signed)
Emergency Medicine Provider Triage Evaluation Note  Keegen Heffern , a 76 y.o. male  was evaluated in triage.  Pt complains of persistent cough since Sunday that is now associated with right-sided chest pain.  Chest pain is present at rest however is worse with coughing or deep breathing.  Denies previous cardiac history.  Denies shortness of breath.  He does have history of hypertension, diabetes.   Review of Systems  Positive: As above Negative: As above  Physical Exam  BP (!) 156/80 (BP Location: Right Arm)   Pulse 86   Temp 99.1 F (37.3 C) (Oral)   Resp 16   Ht 5\' 10"  (1.778 m)   Wt 88.9 kg   SpO2 98%   BMI 28.12 kg/m  Gen:   Awake, no distress   Resp:  Normal effort  MSK:   Moves extremities without difficulty  Other:    Medical Decision Making  Medically screening exam initiated at 10:11 AM.  Appropriate orders placed.  Goran Olden was informed that the remainder of the evaluation will be completed by another provider, this initial triage assessment does not replace that evaluation, and the importance of remaining in the ED until their evaluation is complete.     Carmela Hurt, PA-C 12/24/21 1012

## 2021-12-25 DIAGNOSIS — A419 Sepsis, unspecified organism: Secondary | ICD-10-CM | POA: Diagnosis not present

## 2021-12-25 LAB — CBC
HCT: 38 % — ABNORMAL LOW (ref 39.0–52.0)
Hemoglobin: 13.2 g/dL (ref 13.0–17.0)
MCH: 30.8 pg (ref 26.0–34.0)
MCHC: 34.7 g/dL (ref 30.0–36.0)
MCV: 88.8 fL (ref 80.0–100.0)
Platelets: 186 10*3/uL (ref 150–400)
RBC: 4.28 MIL/uL (ref 4.22–5.81)
RDW: 14.1 % (ref 11.5–15.5)
WBC: 24.3 10*3/uL — ABNORMAL HIGH (ref 4.0–10.5)
nRBC: 0 % (ref 0.0–0.2)

## 2021-12-25 LAB — CBG MONITORING, ED
Glucose-Capillary: 143 mg/dL — ABNORMAL HIGH (ref 70–99)
Glucose-Capillary: 161 mg/dL — ABNORMAL HIGH (ref 70–99)

## 2021-12-25 LAB — URINE CULTURE: Culture: NO GROWTH

## 2021-12-25 LAB — LEGIONELLA PNEUMOPHILA SEROGP 1 UR AG: L. pneumophila Serogp 1 Ur Ag: NEGATIVE

## 2021-12-25 LAB — GLUCOSE, CAPILLARY
Glucose-Capillary: 160 mg/dL — ABNORMAL HIGH (ref 70–99)
Glucose-Capillary: 148 mg/dL — ABNORMAL HIGH (ref 70–99)

## 2021-12-25 LAB — BASIC METABOLIC PANEL
Anion gap: 11 (ref 5–15)
BUN: 16 mg/dL (ref 8–23)
CO2: 20 mmol/L — ABNORMAL LOW (ref 22–32)
Calcium: 8.5 mg/dL — ABNORMAL LOW (ref 8.9–10.3)
Chloride: 105 mmol/L (ref 98–111)
Creatinine, Ser: 1.66 mg/dL — ABNORMAL HIGH (ref 0.61–1.24)
GFR, Estimated: 43 mL/min — ABNORMAL LOW (ref >60)
Glucose, Bld: 154 mg/dL — ABNORMAL HIGH (ref 70–99)
Potassium: 3.9 mmol/L (ref 3.5–5.1)
Sodium: 136 mmol/L (ref 135–145)

## 2021-12-25 LAB — PROCALCITONIN
Procalcitonin: 1.48 ng/mL
Procalcitonin: 1.45 ng/mL

## 2021-12-25 MED ORDER — BENZONATATE 100 MG PO CAPS: 100.0000 mg | ORAL_CAPSULE | Freq: Three times a day (TID) | ORAL | Status: DC

## 2021-12-25 MED ORDER — SODIUM CHLORIDE 0.9 % IV SOLN: INTRAVENOUS | Status: DC

## 2021-12-25 MED ORDER — BENZONATATE 100 MG PO CAPS
100.0000 mg | ORAL_CAPSULE | Freq: Three times a day (TID) | ORAL | Status: DC
  Administered 2021-12-25 – 2021-12-27 (×6): 100 mg via ORAL
  Filled 2021-12-25 (×6): qty 1

## 2021-12-25 MED ORDER — GUAIFENESIN ER 600 MG PO TB12
600.0000 mg | ORAL_TABLET | Freq: Two times a day (BID) | ORAL | Status: DC
Start: 2021-12-25 — End: 2021-12-27
  Administered 2021-12-25 – 2021-12-27 (×5): 600 mg via ORAL
  Filled 2021-12-25 (×5): qty 1

## 2021-12-25 NOTE — Progress Notes (Signed)
PROGRESS NOTE  Warren [name]  DOB: [birth year]  PCP: Glendale Chard, MD [id number]  DOA: 12/24/2021  LOS: 1 day  Hospital Day: 2  Brief narrative: Warren [name] is a 76 y.o. male with PMH significant for DM2, HTN, CKD 2 Patient presented to the ED on 5/16 with complaint of cough, right-sided chest pain for about 5 days and a fever episode of 101.8 with chills.  In the ED, patient had a temperature 100.7, blood pressure elevated to 160s Labs with WC count elevated to 21.2, procalcitonin elevated 1.48, creatinine elevated 1.74, lactic acid elevated to 2 Chest x-ray showed right lung base consolidation compatible with pneumonia, likely in the middle lobe. No pleural effusion.  Subjective: Patient was seen and examined this afternoon.  Pleasant elderly African-American male.  Not in distress.  No new symptoms.  Feels better than at presentation. Chart reviewed Labs this morning with WBC count elevated to 24.3, creatinine further up to 1.66  Principal Problem:   Sepsis ([place]) Active Problems:   CKD (chronic kidney disease) stage 2, GFR 60-89 ml/min   Type 2 diabetes mellitus with stage 2 chronic kidney disease, without long-term current use of insulin (HCC)   Primary hypertension   RLL pneumonia   AKI (acute kidney injury) ([place])   Hyperlipidemia    Assessment and Plan: Sepsis secondary to right middle lobe pneumonia -Presented with fever, chest pain, cough shortness of breath, leukocytosis  -Chest x-ray finding as above showing right middle lobe pneumonia -Currently on IV antibiotics. -WBC count significantly elevated.  Continue to monitor.  Repeat lactic acid level tomorrow. -Continue IV hydration today. Recent Labs  Lab 12/24/21 0956 12/24/21 1510 12/25/21 0305  WBC 21.2*  --  24.3*  LATICACIDVEN  --  2.0*  --   PROCALCITON 1.48  --  1.45   AKI on CKD stage II -Presented with creatinine elevated 1.74 against a baseline of 1.2  -Continue IV fluid Recent Labs     01/14/21 1557 02/19/21 1035 06/03/21 1009 07/10/21 1039 10/03/21 0938 12/24/21 0956 12/25/21 0305  BUN 11 16 18 18 12 13 16   CREATININE 1.19 1.41* 1.35* 1.30* 1.11 1.74* 1.66*   Uncontrolled type 2 diabetes mellitus -A1c 8.8 on 10/03/2021 -Home meds include Januvia, Farxiga -Currently remains on hold.  On sliding scale insulin with Accu-Cheks Recent Labs  Lab 12/24/21 1156 12/25/21 0814 12/25/21 1255  GLUCAP 247* 143* 161*   Essential hypertension -Continue to hold amlodipine, telmesartan-hydrochlorothiazide -Continue hydralazine PRN   Hyperlipidemia -Continue Lipitor 80 mg daily  Goals of care   Code Status: Full Code   Mobility: Encourage ambulation  Skin assessment:     Nutritional status:  Body mass index is 28.12 kg/m.          Diet:  Diet Order             Diet Carb Modified Fluid consistency: Thin; Room service appropriate? Yes  Diet effective now                   DVT prophylaxis:  enoxaparin (LOVENOX) injection 40 mg Start: 12/24/21 1845   Antimicrobials: IV Rocephin Fluid: NS at 50 mill per hour Consultants: None Family Communication: None at bedside  Status is: Inpatient  Continue in-hospital care because: Needs IV hydration, renal function monitoring Level of care: Med-Surg   Dispo: The patient is from: Home               Anticipated d/c is to: Pending clinical course  Patient currently is not medically stable to d/c.   Difficult to place patient No     Infusions:   sodium chloride     cefTRIAXone (ROCEPHIN)  IV Stopped (12/25/21 1046)    Scheduled Meds:  atorvastatin  80 mg Oral Daily   azithromycin  500 mg Oral Daily   benzonatate  100 mg Oral TID   enoxaparin (LOVENOX) injection  40 mg Subcutaneous Q24H   guaiFENesin  600 mg Oral BID   insulin aspart  0-15 Units Subcutaneous TID WC    PRN meds: acetaminophen **OR** acetaminophen, hydrALAZINE, HYDROcodone-acetaminophen, ondansetron **OR**  ondansetron (ZOFRAN) IV   Antimicrobials: Anti-infectives (From admission, onward)    Start     Dose/Rate Route Frequency Ordered Stop   12/25/21 1000  cefTRIAXone (ROCEPHIN) 2 g in sodium chloride 0.9 % 100 mL IVPB        2 g 200 mL/hr over 30 Minutes Intravenous Every 24 hours 12/24/21 1832 12/29/21 0959   12/25/21 1000  azithromycin (ZITHROMAX) tablet 500 mg        500 mg Oral Daily 12/24/21 1832 12/29/21 0959   12/24/21 1500  cefTRIAXone (ROCEPHIN) 1 g in sodium chloride 0.9 % 100 mL IVPB        1 g 200 mL/hr over 30 Minutes Intravenous  Once 12/24/21 1451 12/24/21 1700   12/24/21 1500  azithromycin (ZITHROMAX) 500 mg in sodium chloride 0.9 % 250 mL IVPB        500 mg 250 mL/hr over 60 Minutes Intravenous  Once 12/24/21 1451 12/24/21 1831       Objective: Vitals:   12/25/21 1300 12/25/21 1317  BP: 119/72   Pulse: 67   Resp: 18   Temp:  98.4 F (36.9 C)  SpO2: 95%    No intake or output data in the 24 hours ending 12/25/21 1447 Filed Weights   12/24/21 0956  Weight: 88.9 kg   Weight change:  Body mass index is 28.12 kg/m.   Physical Exam: General exam: Pleasant, elderly African-American male.  Not in distress Skin: No rashes, lesions or ulcers. HEENT: Atraumatic, normocephalic, no obvious bleeding Lungs: Coughs on deep breathing.  Rhonchi mild diffuse bilateral CVS: Regular rate and rhythm, no murmur GI/Abd soft, nontender, nondistended, bowel sound present CNS: Regular rate and rhythm, no murmur Psychiatry: Mood appropriate Extremities: No pedal edema, no calf tenderness  Data Review: I have personally reviewed the laboratory data and studies available.  F/u labs ordered Unresulted Labs (From admission, onward)     Start     Ordered   12/31/21 0500  Creatinine, serum  (enoxaparin (LOVENOX)    CrCl >/= 30 ml/min)  Weekly,   R     Comments: while on enoxaparin therapy    12/24/21 1832   12/26/21 0500  CBC with Differential/Platelet  Tomorrow morning,   R         12/25/21 0806   12/26/21 [id number]  Basic metabolic panel  Tomorrow morning,   R        12/25/21 0806   12/26/21 0500  Lactic acid, plasma  Tomorrow morning,   R        12/25/21 0806   12/25/21 0500  Procalcitonin  Daily,   R      12/24/21 1722   12/24/21 1449  Expectorated Sputum Assessment w Gram Stain, Rflx to Resp Cult  (Undifferentiated presentation (screening labs and basic nursing orders))  ONCE - URGENT,   URGENT  12/24/21 1451            Signed, Terrilee Croak, MD [facility] 12/25/2021

## 2021-12-25 NOTE — Plan of Care (Signed)

## 2021-12-25 NOTE — ED Notes (Signed)
Breakfast order placed ?

## 2021-12-26 ENCOUNTER — Ambulatory Visit: Payer: Medicare HMO

## 2021-12-26 ENCOUNTER — Ambulatory Visit: Payer: Medicare HMO | Admitting: Internal Medicine

## 2021-12-26 DIAGNOSIS — A419 Sepsis, unspecified organism: Secondary | ICD-10-CM | POA: Diagnosis not present

## 2021-12-26 LAB — CBC WITH DIFFERENTIAL/PLATELET
Abs Immature Granulocytes: 0.11 10*3/uL — ABNORMAL HIGH (ref 0.00–0.07)
Basophils Absolute: 0.1 10*3/uL (ref 0.0–0.1)
Basophils Relative: 0 %
Eosinophils Absolute: 0.2 10*3/uL (ref 0.0–0.5)
Eosinophils Relative: 1 %
HCT: 33.4 % — ABNORMAL LOW (ref 39.0–52.0)
Hemoglobin: 11.7 g/dL — ABNORMAL LOW (ref 13.0–17.0)
Immature Granulocytes: 1 %
Lymphocytes Relative: 13 %
Lymphs Abs: 2.5 10*3/uL (ref 0.7–4.0)
MCH: 30.5 pg (ref 26.0–34.0)
MCHC: 35 g/dL (ref 30.0–36.0)
MCV: 87 fL (ref 80.0–100.0)
Monocytes Absolute: 2 10*3/uL — ABNORMAL HIGH (ref 0.1–1.0)
Monocytes Relative: 10 %
Neutro Abs: 13.9 10*3/uL — ABNORMAL HIGH (ref 1.7–7.7)
Neutrophils Relative %: 75 %
Platelets: 182 10*3/uL (ref 150–400)
RBC: 3.84 MIL/uL — ABNORMAL LOW (ref 4.22–5.81)
RDW: 14 % (ref 11.5–15.5)
WBC: 18.7 10*3/uL — ABNORMAL HIGH (ref 4.0–10.5)
nRBC: 0 % (ref 0.0–0.2)

## 2021-12-26 LAB — BASIC METABOLIC PANEL
Anion gap: 7 (ref 5–15)
BUN: 16 mg/dL (ref 8–23)
CO2: 23 mmol/L (ref 22–32)
Calcium: 8.2 mg/dL — ABNORMAL LOW (ref 8.9–10.3)
Chloride: 107 mmol/L (ref 98–111)
Creatinine, Ser: 1.39 mg/dL — ABNORMAL HIGH (ref 0.61–1.24)
GFR, Estimated: 54 mL/min — ABNORMAL LOW (ref >60)
Glucose, Bld: 139 mg/dL — ABNORMAL HIGH (ref 70–99)
Potassium: 4.1 mmol/L (ref 3.5–5.1)
Sodium: 137 mmol/L (ref 135–145)

## 2021-12-26 LAB — GLUCOSE, CAPILLARY
Glucose-Capillary: 145 mg/dL — ABNORMAL HIGH (ref 70–99)
Glucose-Capillary: 130 mg/dL — ABNORMAL HIGH (ref 70–99)
Glucose-Capillary: 129 mg/dL — ABNORMAL HIGH (ref 70–99)

## 2021-12-26 LAB — LACTIC ACID, PLASMA: Lactic Acid, Venous: 0.9 mmol/L (ref 0.5–1.9)

## 2021-12-26 LAB — PROCALCITONIN: Procalcitonin: 1.15 ng/mL

## 2021-12-26 MED ORDER — SODIUM CHLORIDE 0.9 % IV SOLN
INTRAVENOUS | Status: DC
Start: 2021-12-26 — End: 2021-12-27

## 2021-12-26 NOTE — Evaluation (Signed)
Physical Therapy Evaluation and Discharge Patient Details Name: Warren [name] MRN: [id number] DOB: March 31, 1946 Today's Date: 12/26/2021  History of Present Illness  Pt is a 76 y/o male admitted secondary to cough and R chest pain. Found to have R middle lobe PNA. PMH includes DM, CKD, and HTN.  Clinical Impression  Patient evaluated by Physical Therapy with no further acute PT needs identified. All education has been completed and the patient has no further questions. Pt overall at a mod I to independent level with mobility tasks. No LOB noted. Educated about bracing to help with pain when coughing and generalized walking program. Educated about continued mobility with mobility specialist while admitted during hospitalization. See below for any follow-up Physical Therapy or equipment needs. PT is signing off. Thank you for this referral. If needs change, please re-consult.         Recommendations for follow up therapy are one component of a multi-disciplinary discharge planning process, led by the attending physician.  Recommendations may be updated based on patient status, additional functional criteria and insurance authorization.  Follow Up Recommendations No PT follow up    Assistance Recommended at Discharge PRN  Patient can return home with the following       Equipment Recommendations None recommended by PT  Recommendations for Other Services       Functional Status Assessment Patient has had a recent decline in their functional status and demonstrates the ability to make significant improvements in function in a reasonable and predictable amount of time.     Precautions / Restrictions Precautions Precautions: None Restrictions Weight Bearing Restrictions: No      Mobility  Bed Mobility Overal bed mobility: Independent                  Transfers Overall transfer level: Independent                      Ambulation/Gait Ambulation/Gait assistance:  Modified independent (Device/Increase time) Gait Distance (Feet): 550 Feet Assistive device: IV Pole Gait Pattern/deviations: WFL(Within Functional Limits) Gait velocity: decreased     General Gait Details: Slower gait speed, but no LOB noted. Educated about walking program and importance of continued mobility with mobility specialists  Stairs            Wheelchair Mobility    Modified Rankin (Stroke Patients Only)       Balance Overall balance assessment: No apparent balance deficits (not formally assessed)                                           Pertinent Vitals/Pain Pain Assessment Pain Assessment: Faces Faces Pain Scale: Hurts little more Pain Location: chest Pain Descriptors / Indicators: Sore Pain Intervention(s): Limited activity within patient's tolerance, Monitored during session, Repositioned    Home Living Family/patient expects to be discharged to:: Private residence Living Arrangements: Alone Available Help at Discharge: Family Type of Home: House Home Access: Stairs to enter Entrance Stairs-Rails: Right Entrance Stairs-Number of Steps: 2   Home Layout: One level Home Equipment: None      Prior Function Prior Level of Function : Independent/Modified Independent             Mobility Comments: 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       Hand Dominance        Extremity/Trunk Assessment   Upper Extremity  Assessment Upper Extremity Assessment: Overall WFL for tasks assessed    Lower Extremity Assessment Lower Extremity Assessment: Overall WFL for tasks assessed    Cervical / Trunk Assessment Cervical / Trunk Assessment: Normal  Communication   Communication: No difficulties  Cognition Arousal/Alertness: Awake/alert Behavior During Therapy: WFL for tasks assessed/performed Overall Cognitive Status: Within Functional Limits for tasks assessed                                          General  Comments      Exercises     Assessment/Plan    PT Assessment Patient does not need any further PT services  PT Problem List         PT Treatment Interventions      PT Goals (Current goals can be found in the Care Plan section)  Acute Rehab PT Goals Patient Stated Goal: to go home PT Goal Formulation: With patient Time For Goal Achievement: 12/26/21 Potential to Achieve Goals: Good    Frequency       Co-evaluation               AM-PAC PT "6 Clicks" Mobility  Outcome Measure Help needed turning from your back to your side while in a flat bed without using bedrails?: None Help needed moving from lying on your back to sitting on the side of a flat bed without using bedrails?: None Help needed moving to and from a bed to a chair (including a wheelchair)?: None Help needed standing up from a chair using your arms (e.g., wheelchair or bedside chair)?: None Help needed to walk in hospital room?: None Help needed climbing 3-5 steps with a railing? : None 6 Click Score: 24    End of Session   Activity Tolerance: Patient tolerated treatment well Patient left: in bed;with call bell/phone within reach Nurse Communication: Mobility status PT Visit Diagnosis: Other abnormalities of gait and mobility (R26.89)    Time: [id number] PT Time Calculation (min) (ACUTE ONLY): 13 min   Charges:   PT Evaluation $PT Eval Low Complexity: 1 Low          Lou Miner, DPT  Acute Rehabilitation Services  Pager: [phone] Office: [phone]   Rudean Hitt 12/26/2021, 12:59 PM

## 2021-12-26 NOTE — Progress Notes (Addendum)
PROGRESS NOTE  Warren [name]  DOB: 05/25/1946  PCP: Glendale Chard, MD [id number]  DOA: 12/24/2021  LOS: 2 days  Hospital Day: 3  Brief narrative: Warren [name] is a 76 y.o. male with PMH significant for DM2, HTN, CKD 2 Patient presented to the ED on 5/16 with complaint of cough, right-sided chest pain for about 5 days and a fever episode of 101.8 with chills.  In the ED, patient had a temperature 100.7, blood pressure elevated to 160s Labs with WBC count elevated to 21.2, procalcitonin elevated 1.48, creatinine elevated 1.74, lactic acid elevated to 2 Chest x-ray showed right lung base consolidation compatible with pneumonia, likely in the middle lobe. No pleural effusion.  Subjective: Patient was seen and examined this morning. Propped up in bed.  Not in distress.  Feels better.  Cough improving.  Able to take deep breath today.  Able to walk hallway without supplemental oxygen. Labs gradually improving  Principal Problem:   Sepsis ([place]) Active Problems:   CKD (chronic kidney disease) stage 2, GFR 60-89 ml/min   Type 2 diabetes mellitus with stage 2 chronic kidney disease, without long-term current use of insulin (HCC)   Primary hypertension   RLL pneumonia   AKI (acute kidney injury) ([place])   Hyperlipidemia    Assessment and Plan: Sepsis secondary to right middle lobe pneumonia -Presented with fever, chest pain, cough shortness of breath, leukocytosis  -Chest x-ray finding as above showing right middle lobe pneumonia -Currently improving on IV antibiotics.  Clinically feels better with less cough, more exercise tolerance.  Able to ambulate without supplemental oxygen. -WBC count improving.  Continue to trend temperature and WBC count.  If continues to improve, plan to discharge tomorrow. Recent Labs  Lab 12/24/21 0956 12/24/21 1510 12/25/21 0305 12/26/21 0239  WBC 21.2*  --  24.3* 18.7*  LATICACIDVEN  --  2.0*  --  0.9  PROCALCITON 1.48  --  1.45 1.15   AKI on  CKD stage II -Presented with creatinine elevated 1.74 against a baseline of 1.2  -Continue IV fluid at a reduced rate of 50 mill per hour. Recent Labs    01/14/21 1557 02/19/21 1035 06/03/21 1009 07/10/21 1039 10/03/21 0938 12/24/21 0956 12/25/21 0305 12/26/21 0239  BUN 11 16 18 18 12 13 16 16   CREATININE 1.19 1.41* 1.35* 1.30* 1.11 1.74* 1.66* 1.39*   Uncontrolled type 2 diabetes mellitus -A1c 8.8 on 10/03/2021 -Home meds include Januvia, Farxiga -Currently remains on hold.  On sliding scale insulin with Accu-Cheks Recent Labs  Lab 12/25/21 1255 12/25/21 1632 12/25/21 1937 12/26/21 0743 12/26/21 1127  GLUCAP 161* 160* 148* 145* 130*   Essential hypertension -Continue to hold amlodipine, telmesartan-hydrochlorothiazide -Continue hydralazine PRN   Hyperlipidemia -Continue Lipitor 80 mg daily  Goals of care   Code Status: Full Code   Mobility: Encourage ambulation  Skin assessment:     Nutritional status:  Body mass index is 28.12 kg/m.          Diet:  Diet Order             Diet Carb Modified Fluid consistency: Thin; Room service appropriate? Yes  Diet effective now                   DVT prophylaxis:  enoxaparin (LOVENOX) injection 40 mg Start: 12/24/21 1845   Antimicrobials: IV Rocephin Fluid: NS at 50 mill per hour Consultants: None Family Communication: None at bedside  Status is: Inpatient  Continue in-hospital care because: Needs IV  hydration, renal function monitoring Level of care: Med-Surg   Dispo: The patient is from: Home               Anticipated d/c is to: Pending clinical course              Patient currently is not medically stable to d/c.   Difficult to place patient No     Infusions:   sodium chloride     cefTRIAXone (ROCEPHIN)  IV 2 g (12/26/21 0907)    Scheduled Meds:  atorvastatin  80 mg Oral Daily   azithromycin  500 mg Oral Daily   benzonatate  100 mg Oral TID   enoxaparin (LOVENOX) injection  40 mg  Subcutaneous Q24H   guaiFENesin  600 mg Oral BID   insulin aspart  0-15 Units Subcutaneous TID WC    PRN meds: acetaminophen **OR** acetaminophen, hydrALAZINE, HYDROcodone-acetaminophen, ondansetron **OR** ondansetron (ZOFRAN) IV   Antimicrobials: Anti-infectives (From admission, onward)    Start     Dose/Rate Route Frequency Ordered Stop   12/25/21 1000  cefTRIAXone (ROCEPHIN) 2 g in sodium chloride 0.9 % 100 mL IVPB        2 g 200 mL/hr over 30 Minutes Intravenous Every 24 hours 12/24/21 1832 12/29/21 0959   12/25/21 1000  azithromycin (ZITHROMAX) tablet 500 mg        500 mg Oral Daily 12/24/21 1832 12/29/21 0959   12/24/21 1500  cefTRIAXone (ROCEPHIN) 1 g in sodium chloride 0.9 % 100 mL IVPB        1 g 200 mL/hr over 30 Minutes Intravenous  Once 12/24/21 1451 12/24/21 1700   12/24/21 1500  azithromycin (ZITHROMAX) 500 mg in sodium chloride 0.9 % 250 mL IVPB        500 mg 250 mL/hr over 60 Minutes Intravenous  Once 12/24/21 1451 12/24/21 1831       Objective: Vitals:   12/26/21 0427 12/26/21 0740  BP: 136/69 (!) 137/58  Pulse: (!) 56 62  Resp: 18 16  Temp: 98.3 F (36.8 C) 98.4 F (36.9 C)  SpO2: 96% 96%    Intake/Output Summary (Last 24 hours) at 12/26/2021 1359 Last data filed at 12/25/2021 1555 Gross per 24 hour  Intake 1.52 ml  Output --  Net 1.52 ml   Filed Weights   12/24/21 0956  Weight: 88.9 kg   Weight change:  Body mass index is 28.12 kg/m.   Physical Exam: General exam: Pleasant, elderly African-American male.  Not in distress Skin: No rashes, lesions or ulcers. HEENT: Atraumatic, normocephalic, no obvious bleeding Lungs: Clear to auscultation bilaterally.  No cough on deep breathing today. CVS: Regular rate and rhythm, no murmur GI/Abd soft, nontender, nondistended, bowel sound present CNS: Regular rate and rhythm, no murmur Psychiatry: Mood appropriate Extremities: No pedal edema, no calf tenderness  Data Review: I have personally reviewed  the laboratory data and studies available.  F/u labs ordered Unresulted Labs (From admission, onward)     Start     Ordered   12/31/21 0500  Creatinine, serum  (enoxaparin (LOVENOX)    CrCl >/= 30 ml/min)  Weekly,   R     Comments: while on enoxaparin therapy    12/24/21 1832   12/24/21 1449  Expectorated Sputum Assessment w Gram Stain, Rflx to Resp Cult  (Undifferentiated presentation (screening labs and basic nursing orders))  ONCE - URGENT,   URGENT        12/24/21 1451  Signed, Terrilee Croak, MD [facility] 12/26/2021

## 2021-12-26 NOTE — Progress Notes (Signed)
Mobility Specialist Progress Note    12/26/21 1511  Mobility  Activity Ambulated independently in hallway  Level of Assistance Modified independent, requires aide device or extra time  Assistive Device Other (Comment) (IV pole)  Distance Ambulated (ft) 1000 ft  Activity Response Tolerated well  $Mobility charge 1 Mobility   Pt received in bed and agreeable. No complaints on walk. Returned to bed with call bell in reach.    [place] Mobility Specialist  Primary: 5N M.S. Phone: [phone] Secondary: 6N M.S. Phone: [phone]

## 2021-12-26 NOTE — Plan of Care (Signed)

## 2021-12-26 NOTE — Plan of Care (Signed)

## 2021-12-27 ENCOUNTER — Other Ambulatory Visit: Payer: Self-pay | Admitting: Internal Medicine

## 2021-12-27 DIAGNOSIS — A419 Sepsis, unspecified organism: Secondary | ICD-10-CM | POA: Diagnosis not present

## 2021-12-27 LAB — GLUCOSE, CAPILLARY
Glucose-Capillary: 131 mg/dL — ABNORMAL HIGH (ref 70–99)
Glucose-Capillary: 184 mg/dL — ABNORMAL HIGH (ref 70–99)

## 2021-12-27 MED ORDER — BENZONATATE 100 MG PO CAPS: 100.0000 mg | ORAL_CAPSULE | Freq: Three times a day (TID) | ORAL | 1 refills | Status: DC | PRN

## 2021-12-27 MED ORDER — HYDROCODONE BIT-HOMATROP MBR 5-1.5 MG/5ML PO SOLN: 5.0000 mL | Freq: Four times a day (QID) | ORAL | 0 refills | Status: DC | PRN

## 2021-12-27 MED ORDER — AMLODIPINE BESYLATE 5 MG PO TABS
5.0000 mg | ORAL_TABLET | Freq: Every day | ORAL | Status: DC
  Administered 2021-12-27: 5 mg via ORAL
  Filled 2021-12-27: qty 1

## 2021-12-27 MED ORDER — GUAIFENESIN ER 600 MG PO TB12
600.0000 mg | ORAL_TABLET | Freq: Two times a day (BID) | ORAL | 0 refills | Status: AC
Start: 2021-12-27 — End: 2022-01-01

## 2021-12-27 MED ORDER — CEFDINIR 300 MG PO CAPS: 300.0000 mg | ORAL_CAPSULE | Freq: Two times a day (BID) | ORAL | 0 refills | Status: DC

## 2021-12-27 NOTE — Progress Notes (Signed)
Mobility Specialist Progress Note   12/27/21 1114  Mobility  Activity Ambulated independently in hallway  Level of Assistance Modified independent, requires aide device or extra time  Assistive Device  (IV pole)  Distance Ambulated (ft) 860 ft  Activity Response Tolerated well  $Mobility charge 1 Mobility   Pre Mobility: 95% SpO2 During Mobility: 92% SpO2 Post Mobility: 96% SpO2  Received pt in chair having no complaints and agreeable to mobility. Asymptomatic throughout ambulation, returned back to chair after successful void in BR. Left w/ call bell in reach and all needs met.  Jeremiah Hedrick Mobility Specialist Phone Number [phone]  

## 2021-12-27 NOTE — Discharge Summary (Signed)
Physician Discharge Summary  Terrence Wishon QBV:[id number] DOB: [birth year] DOA: 12/24/2021  PCP: Glendale Chard, MD  Admit date: 12/24/2021 Discharge date: 12/27/2021  Admitted From: Home Discharge disposition: Home  Brief narrative: Warren [name] is a 76 y.o. male with PMH significant for DM2, HTN, CKD 2 Patient presented to the ED on 5/16 with complaint of cough, right-sided chest pain for about 5 days and a fever episode of 101.8 with chills.  In the ED, patient had a temperature 100.7, blood pressure elevated to 160s Labs with WBC count elevated to 21.2, procalcitonin elevated 1.48, creatinine elevated 1.74, lactic acid elevated to 2 Chest x-ray showed right lung base consolidation compatible with pneumonia, likely in the middle lobe. No pleural effusion.  Subjective: Patient was seen and examined this morning.  Pleasant elderly African-American male.  Sitting up in chair.  Not in distress.  Mild cough present.  Feels comfortable enough to go home.  Daughter at bedside  Principal Problem:   Sepsis [facility]) Active Problems:   CKD (chronic kidney disease) stage 2, GFR 60-89 ml/min   Type 2 diabetes mellitus with stage 2 chronic kidney disease, without long-term current use of insulin (HCC)   Primary hypertension   RLL pneumonia   AKI (acute kidney injury) ([place])   Hyperlipidemia    Hospital course: Sepsis secondary to right middle lobe pneumonia -Presented with fever, chest pain, cough shortness of breath, leukocytosis  -Chest x-ray finding as above showing right middle lobe pneumonia -Currently improving on IV Rocephin and azithromycin.  Clinically feels better with less cough, more exercise tolerance.  Able to ambulate without supplemental oxygen. -WBC count improving.  No fever.  Completed 3-day course of azithromycin.  Discharged today on 5 more days of oral Omnicef. Recent Labs  Lab 12/24/21 0956 12/24/21 1510 12/25/21 0305 12/26/21 0239  WBC 21.2*  --  24.3* 18.7*   LATICACIDVEN  --  2.0*  --  0.9  PROCALCITON 1.48  --  1.45 1.15   AKI on CKD stage II -Presented with creatinine elevated 1.74 against a baseline of 1.2  -Improved creatinine with IV hydration. Recent Labs    01/14/21 1557 02/19/21 1035 06/03/21 1009 07/10/21 1039 10/03/21 0938 12/24/21 0956 12/25/21 0305 12/26/21 0239  BUN $Re'11 16 18 18 12 13 16 16  'Jqh$ CREATININE 1.19 1.41* 1.35* 1.30* 1.11 1.74* 1.66* 1.39*   Uncontrolled type 2 diabetes mellitus -A1c 8.8 on 10/03/2021 -Home meds include Januvia, Farxiga -Resume the same at home.  Essential hypertension -Resume amlodipine   Hyperlipidemia -Continue Lipitor 80 mg daily  Goals of care   Code Status: Full Code   Mobility: Encourage ambulation  Skin assessment:     Nutritional status:  Body mass index is 28.12 kg/m.             Wounds:  -    Discharge Exam:   Vitals:   12/26/21 0427 12/26/21 0740 12/26/21 1923 12/27/21 0420  BP: 136/69 (!) 137/58 119/65 (!) 149/73  Pulse: (!) 56 62 60 62  Resp: $Remo'18 16 19 19  'BWGXd$ Temp: 98.3 F (36.8 C) 98.4 F (36.9 C) 99.2 F (37.3 C) 99.1 F (37.3 C)  TempSrc: Oral Oral Oral Oral  SpO2: 96% 96% 95% 98%  Weight:      Height:        Body mass index is 28.12 kg/m.  General exam: Pleasant, elderly African-American male.  Not in distress Skin: No rashes, lesions or ulcers. HEENT: Atraumatic, normocephalic, no obvious bleeding Lungs: Clear to auscultation bilaterally.  No cough on deep breathing today. CVS: Regular rate and rhythm, no murmur GI/Abd soft, nontender, nondistended, bowel sound present CNS: Regular rate and rhythm, no murmur Psychiatry: Mood appropriate Extremities: No pedal edema, no calf tenderness  Follow ups:    Follow-up Information     Glendale Chard, MD Follow up.   Specialty: Internal Medicine Contact information: [address] [place] [address] [phone]                 Discharge Instructions:    Discharge Instructions     Call MD for:  difficulty breathing, headache or visual disturbances   Complete by: As directed    Call MD for:  extreme fatigue   Complete by: As directed    Call MD for:  hives   Complete by: As directed    Call MD for:  persistant dizziness or light-headedness   Complete by: As directed    Call MD for:  persistant nausea and vomiting   Complete by: As directed    Call MD for:  severe uncontrolled pain   Complete by: As directed    Call MD for:  temperature >100.4   Complete by: As directed    Diet general   Complete by: As directed    Discharge instructions   Complete by: As directed    General discharge instructions: Follow with Primary MD Glendale Chard, MD in 7 days  Please request your PCP  to go over your hospital tests, procedures, radiology results at the follow up. Please get your medicines reviewed and adjusted.  Your PCP may decide to repeat certain labs or tests as needed. Do not drive, operate heavy machinery, perform activities at heights, swimming or participation in water activities or provide baby sitting services if your were admitted for syncope or siezures until you have seen by Primary MD or a Neurologist and advised to do so again. [place] Controlled Substance Reporting System database was reviewed. Do not drive, operate heavy machinery, perform activities at heights, swim, participate in water activities or provide baby-sitting services while on medications for pain, sleep and mood until your outpatient physician has reevaluated you and advised to do so again.  You are strongly recommended to comply with the dose, frequency and duration of prescribed medications. Activity: As tolerated with Full fall precautions use walker/cane & assistance as needed Avoid using any recreational substances like cigarette, tobacco, alcohol, or non-prescribed drug. If you experience worsening of your admission symptoms, develop shortness of  breath, life threatening emergency, suicidal or homicidal thoughts you must seek medical attention immediately by calling 911 or calling your MD immediately  if symptoms less severe. You must read complete instructions/literature along with all the possible adverse reactions/side effects for all the medicines you take and that have been prescribed to you. Take any new medicine only after you have completely understood and accepted all the possible adverse reactions/side effects.  Wear Seat belts while driving. You were cared for by a hospitalist during your hospital stay. If you have any questions about your discharge medications or the care you received while you were in the hospital after you are discharged, you can call the unit and ask to speak with the hospitalist or the covering physician. Once you are discharged, your primary care physician will handle any further medical issues. Please note that NO REFILLS for any discharge medications will be authorized once you are discharged, as it is imperative that you return to your primary care  physician (or establish a relationship with a primary care physician if you do not have one).   Increase activity slowly   Complete by: As directed        Discharge Medications:   Allergies as of 12/27/2021   No Known Allergies      Medication List     STOP taking these medications    telmisartan-hydrochlorothiazide 80-12.5 MG tablet Commonly known as: MICARDIS HCT       TAKE these medications    amLODipine 5 MG tablet Commonly known as: NORVASC Take 1 tablet (5 mg total) by mouth daily.   atorvastatin 40 MG tablet Commonly known as: LIPITOR Take 40 mg by mouth daily. What changed: Another medication with the same name was removed. Continue taking this medication, and follow the directions you see here.   cefdinir 300 MG capsule Commonly known as: OMNICEF Take 1 capsule (300 mg total) by mouth 2 (two) times daily for 5 days.    dapagliflozin propanediol 10 MG Tabs tablet Commonly known as: FARXIGA Take 1 tablet (10 mg total) by mouth daily.   glucose blood test strip Commonly known as: ONE TOUCH ULTRA TEST Use as instructed to check blood sugars 1 time per day dx: e11.65 dx: e11.22   guaiFENesin 600 MG 12 hr tablet Commonly known as: MUCINEX Take 1 tablet (600 mg total) by mouth 2 (two) times daily for 5 days.   ONE TOUCH ULTRA 2 w/Device Kit Use as directed to check blood sugars 1 time per day dx: e11.65   sildenafil 20 MG tablet Commonly known as: REVATIO Take 1 tablet (20 mg total) by mouth as needed. MAX 3 TO 5 TABLETS PER DAY   sitaGLIPtin 100 MG tablet Commonly known as: Januvia Take 1 tablet (100 mg total) by mouth daily.         The results of significant diagnostics from this hospitalization (including imaging, microbiology, ancillary and laboratory) are listed below for reference.    Procedures and Diagnostic Studies:   DG Chest 2 View  Result Date: 12/24/2021 CLINICAL DATA:  76 year old male with cough and congestion, shortness of breath. EXAM: CHEST - 2 VIEW COMPARISON:  None Available. FINDINGS: Somewhat low lung volumes. Right lung base consolidation, probably in the lateral segment of the right middle lobe and less likely in the lower lobe. Abnormality tracks toward the right hilum. Hilar and mediastinal contours seem to remain normal. Visualized tracheal air column is within normal limits. No superimposed pneumothorax, pulmonary edema or pleural effusion. Left lung appears negative. No acute osseous abnormality identified. Negative visible bowel gas. IMPRESSION: Right lung base consolidation compatible with Pneumonia, likely in the middle lobe. No pleural effusion. Followup PA and lateral chest X-ray is recommended in 3-4 weeks following trial of antibiotic therapy to ensure resolution and exclude underlying malignancy. Electronically Signed   By: Odessa Fleming M.D.   On: 12/24/2021 10:59      Labs:   Basic Metabolic Panel: Recent Labs  Lab 12/24/21 0956 12/25/21 0305 12/26/21 0239  NA 135 136 137  K 3.6 3.9 4.1  CL 104 105 107  CO2 22 20* 23  GLUCOSE 321* 154* 139*  BUN 13 16 16   CREATININE 1.74* 1.66* 1.39*  CALCIUM 9.0 8.5* 8.2*   GFR Estimated Creatinine Clearance: 53.2 mL/min (A) (by C-G formula based on SCr of 1.39 mg/dL (H)). Liver Function Tests: No results for input(s): AST, ALT, ALKPHOS, BILITOT, PROT, ALBUMIN in the last 168 hours. No results for input(s): LIPASE, AMYLASE  in the last 168 hours. No results for input(s): AMMONIA in the last 168 hours. Coagulation profile Recent Labs  Lab 12/24/21 1510  INR 1.2    CBC: Recent Labs  Lab 12/24/21 0956 12/25/21 0305 12/26/21 0239  WBC 21.2* 24.3* 18.7*  NEUTROABS 16.7*  --  13.9*  HGB 13.8 13.2 11.7*  HCT 41.2 38.0* 33.4*  MCV 89.2 88.8 87.0  PLT 202 186 182   Cardiac Enzymes: No results for input(s): CKTOTAL, CKMB, CKMBINDEX, TROPONINI in the last 168 hours. BNP: Invalid input(s): POCBNP CBG: Recent Labs  Lab 12/26/21 0743 12/26/21 1127 12/26/21 1618 12/27/21 0733 12/27/21 1136  GLUCAP 145* 130* 129* 131* 184*   D-Dimer No results for input(s): DDIMER in the last 72 hours. Hgb A1c No results for input(s): HGBA1C in the last 72 hours. Lipid Profile No results for input(s): CHOL, HDL, LDLCALC, TRIG, CHOLHDL, LDLDIRECT in the last 72 hours. Thyroid function studies No results for input(s): TSH, T4TOTAL, T3FREE, THYROIDAB in the last 72 hours.  Invalid input(s): FREET3 Anemia work up No results for input(s): VITAMINB12, FOLATE, FERRITIN, TIBC, IRON, RETICCTPCT in the last 72 hours. Microbiology Recent Results (from the past 240 hour(s))  Novel Coronavirus, NAA ([facility])     Status: None   Collection Time: 12/23/21 10:45 AM   Specimen: Nasopharyngeal(NP) swabs in vial transport medium  Result Value Ref Range Status   SARS-CoV-2, NAA Not Detected Not Detected Final     Comment: This nucleic acid amplification test was developed and its performance characteristics determined by [employer], [employer]. Nucleic acid amplification tests include RT-PCR and TMA. This test has not been FDA cleared or approved. This test has been authorized by FDA under an Emergency Use Authorization (EUA). This test is only authorized for the duration of time the declaration that circumstances exist justifying the authorization of the emergency use of in vitro diagnostic tests for detection of SARS-CoV-2 virus and/or diagnosis of COVID-19 infection under section 564(b)(1) of the Act, 21 U.S.C. 333LKT-6(Y) (1), unless the authorization is terminated or revoked sooner. When diagnostic testing is negative, the possibility of a false negative result should be considered in the context of a patient's recent exposures and the presence of clinical signs and symptoms consistent with COVID-19. An individual without symptoms of COVID-19 and who is not shedding SARS-CoV-2 virus wo uld expect to have a negative (not detected) result in this assay.   Resp Panel by RT-PCR (Flu A&B, Covid) Nasopharyngeal Swab     Status: None   Collection Time: 12/24/21 10:12 AM   Specimen: Nasopharyngeal Swab; Nasopharyngeal(NP) swabs in vial transport medium  Result Value Ref Range Status   SARS Coronavirus 2 by RT PCR NEGATIVE NEGATIVE Final    Comment: (NOTE) SARS-CoV-2 target nucleic acids are NOT DETECTED.  The SARS-CoV-2 RNA is generally detectable in upper respiratory specimens during the acute phase of infection. The lowest concentration of SARS-CoV-2 viral copies this assay can detect is 138 copies/mL. A negative result does not preclude SARS-Cov-2 infection and should not be used as the sole basis for treatment or other patient management decisions. A negative result may occur with  improper specimen collection/handling, submission of specimen other than nasopharyngeal swab, presence of viral  mutation(s) within the areas targeted by this assay, and inadequate number of viral copies(<138 copies/mL). A negative result must be combined with clinical observations, patient history, and epidemiological information. The expected result is Negative.  Fact Sheet for Patients:  EntrepreneurPulse.com.au  Fact Sheet for Healthcare Providers:  IncredibleEmployment.be  This test is no t yet approved or cleared by the [place] and  has been authorized for detection and/or diagnosis of SARS-CoV-2 by FDA under an Emergency Use Authorization (EUA). This EUA will remain  in effect (meaning this test can be used) for the duration of the COVID-19 declaration under Section 564(b)(1) of the Act, 21 U.S.C.section 360bbb-3(b)(1), unless the authorization is terminated  or revoked sooner.       Influenza A by PCR NEGATIVE NEGATIVE Final   Influenza B by PCR NEGATIVE NEGATIVE Final    Comment: (NOTE) The Xpert Xpress SARS-CoV-2/FLU/RSV plus assay is intended as an aid in the diagnosis of influenza from Nasopharyngeal swab specimens and should not be used as a sole basis for treatment. Nasal washings and aspirates are unacceptable for Xpert Xpress SARS-CoV-2/FLU/RSV testing.  Fact Sheet for Patients: EntrepreneurPulse.com.au  Fact Sheet for Healthcare Providers: IncredibleEmployment.be  This test is not yet approved or cleared by the [place] FDA and has been authorized for detection and/or diagnosis of SARS-CoV-2 by FDA under an Emergency Use Authorization (EUA). This EUA will remain in effect (meaning this test can be used) for the duration of the COVID-19 declaration under Section 564(b)(1) of the Act, 21 U.S.C. section 360bbb-3(b)(1), unless the authorization is terminated or revoked.  Performed at [facility], [place] [address] [place], [place] [address]   Blood Culture (routine x 2)      Status: None (Preliminary result)   Collection Time: 12/24/21  3:22 PM   Specimen: BLOOD  Result Value Ref Range Status   Specimen Description BLOOD RIGHT ANTECUBITAL  Final   Special Requests   Final    BOTTLES DRAWN AEROBIC AND ANAEROBIC Blood Culture adequate volume   Culture   Final    NO GROWTH 3 DAYS Performed at [facility], [place] [address] [place], [place] [address]    Report Status PENDING  Incomplete  Blood Culture (routine x 2)     Status: None (Preliminary result)   Collection Time: 12/24/21  3:31 PM   Specimen: BLOOD RIGHT HAND  Result Value Ref Range Status   Specimen Description BLOOD RIGHT HAND  Final   Special Requests   Final    BOTTLES DRAWN AEROBIC AND ANAEROBIC Blood Culture results may not be optimal due to an inadequate volume of blood received in culture bottles   Culture   Final    NO GROWTH 3 DAYS Performed at [facility], [place] [address] [place], [place] [address]    Report Status PENDING  Incomplete  Urine Culture     Status: None   Collection Time: 12/24/21  6:34 PM   Specimen: In/Out Cath Urine  Result Value Ref Range Status   Specimen Description IN/OUT CATH URINE  Final   Special Requests NONE  Final   Culture   Final    NO GROWTH Performed at [facility], [place] [address], [place], [place] [address]    Report Status 12/25/2021 FINAL  Final    Time coordinating discharge: 35 minutes  Signed: Nimo Verastegui  [facility] 12/27/2021, 1:43 PM

## 2021-12-27 NOTE — Care Management Important Message (Signed)
Important Message  Patient Details  Name: Abdallah Hern MRN: [id number] Date of Birth: 12/06/1945   Medicare Important Message Given:  Yes  Patient left prior to IM delivery will mail document to the patient home address.     Khalid Lacko 12/27/2021, 3:20 PM

## 2021-12-27 NOTE — Plan of Care (Signed)

## 2021-12-27 NOTE — Progress Notes (Signed)
Patient alert and oriented verbalized understanding of dc instructions

## 2021-12-29 LAB — CULTURE, BLOOD (ROUTINE X 2)
Culture: NO GROWTH
Culture: NO GROWTH
Special Requests: ADEQUATE

## 2021-12-30 ENCOUNTER — Telehealth: Payer: Self-pay

## 2021-12-30 ENCOUNTER — Other Ambulatory Visit: Payer: Self-pay

## 2021-12-30 MED ORDER — ALBUTEROL SULFATE HFA 108 (90 BASE) MCG/ACT IN AERS: 2.0000 | INHALATION_SPRAY | Freq: Four times a day (QID) | RESPIRATORY_TRACT | 2 refills | Status: AC | PRN

## 2021-12-30 NOTE — Telephone Encounter (Signed)
Error

## 2021-12-30 NOTE — Telephone Encounter (Signed)
Transition Care Management Follow-up Telephone Call Date of discharge and from where: 12/24/2021 [facility] How have you been since you were released from the hospital? Pt states he is just hanging in there.  Any questions or concerns? No  Items Reviewed: Did the pt receive and understand the discharge instructions provided? Yes  Medications obtained and verified? Yes  Other? Yes  Any new allergies since your discharge? No  Dietary orders reviewed? Yes Do you have support at home? Yes   Home Care and Equipment/Supplies: Were home health services ordered? no If so, what is the name of the agency? N/a  Has the agency set up a time to come to the patient's home? no Were any new equipment or medical supplies ordered?  No What is the name of the medical supply agency? N/a Were you able to get the supplies/equipment? no Do you have any questions related to the use of the equipment or supplies? No  Functional Questionnaire: (I = Independent and D = Dependent) ADLs: i  Bathing/Dressing- i  Meal Prep- i  Eating- i  Maintaining continence- i  Transferring/Ambulation- i  Managing Meds- i  Follow up appointments reviewed:  PCP Hospital f/u appt confirmed? Yes  Scheduled to see n/a on n/a @ n/a. [facility] f/u appt confirmed? No  Scheduled to see n/a on n/a @ n/a. Are transportation arrangements needed? No  If their condition worsens, is the pt aware to call PCP or go to the Emergency Dept.? Yes Was the patient provided with contact information for the PCP's office or ED? Yes Was to pt encouraged to call back with questions or concerns? Yes

## 2021-12-31 ENCOUNTER — Telehealth: Payer: Self-pay

## 2021-12-31 NOTE — Telephone Encounter (Signed)
Spoke with patient, called to see how he was doing. Patient reports doing well, better than yesterday. He has HPFU appointment scheduled for tomorrow at 3:20

## 2022-01-01 ENCOUNTER — Encounter: Payer: Self-pay | Admitting: Nurse Practitioner

## 2022-01-01 ENCOUNTER — Inpatient Hospital Stay: Payer: Self-pay | Admitting: Nurse Practitioner

## 2022-01-01 ENCOUNTER — Ambulatory Visit (INDEPENDENT_AMBULATORY_CARE_PROVIDER_SITE_OTHER): Payer: Medicare HMO | Admitting: Nurse Practitioner

## 2022-01-01 VITALS — BP 128/70 | HR 65 | Temp 98.1°F | Ht 70.0 in | Wt 197.0 lb

## 2022-01-01 DIAGNOSIS — N182 Chronic kidney disease, stage 2 (mild): Secondary | ICD-10-CM

## 2022-01-01 DIAGNOSIS — E1122 Type 2 diabetes mellitus with diabetic chronic kidney disease: Secondary | ICD-10-CM | POA: Diagnosis not present

## 2022-01-01 DIAGNOSIS — N179 Acute kidney failure, unspecified: Secondary | ICD-10-CM | POA: Diagnosis not present

## 2022-01-01 DIAGNOSIS — A419 Sepsis, unspecified organism: Secondary | ICD-10-CM | POA: Diagnosis not present

## 2022-01-01 DIAGNOSIS — J189 Pneumonia, unspecified organism: Secondary | ICD-10-CM | POA: Diagnosis not present

## 2022-01-01 DIAGNOSIS — I129 Hypertensive chronic kidney disease with stage 1 through stage 4 chronic kidney disease, or unspecified chronic kidney disease: Secondary | ICD-10-CM | POA: Diagnosis not present

## 2022-01-01 DIAGNOSIS — E782 Mixed hyperlipidemia: Secondary | ICD-10-CM | POA: Diagnosis not present

## 2022-01-01 MED ORDER — CEFTRIAXONE SODIUM 1 G IJ SOLR: 1.0000 g | Freq: Once | INTRAMUSCULAR | Status: DC

## 2022-01-01 MED ORDER — TRIAMCINOLONE ACETONIDE 40 MG/ML IJ SUSP: 40.0000 mg | Freq: Once | INTRAMUSCULAR | Status: DC

## 2022-01-01 MED ORDER — HYDROCODONE BIT-HOMATROP MBR 5-1.5 MG/5ML PO SOLN: 5.0000 mL | Freq: Four times a day (QID) | ORAL | 0 refills | Status: DC | PRN

## 2022-01-01 NOTE — Progress Notes (Signed)
I,Tianna Badgett,acting as a Education administrator for [employer], [name].,have documented all relevant documentation on the behalf of Minette Brine, [name],as directed by  Minette Brine, [name] while in the presence of Minette Brine, [place].  This visit occurred during the SARS-CoV-2 public health emergency.  Safety protocols were in place, including screening questions prior to the visit, additional usage of staff PPE, and extensive cleaning of exam room while observing appropriate contact time as indicated for disinfecting solutions.  Subjective:     Patient ID: Warren [name] , male    DOB: 1946/05/14 , 76 y.o.   MRN: [id number]   Chief Complaint  Patient presents with   Hospitalization Follow-up    HPI  Patient presents today for HFU.   Warren [name] is a 76 y.o. male with PMH significant for DM2, HTN, CKD 2 Patient presented to the ED on 5/16 with complaint of cough, right-sided chest pain for about 5 days and a fever episode of 101.8 with chills.   In the ED, patient had a temperature 100.7, blood pressure elevated to 160s Labs with WBC count elevated to 21.2, procalcitonin elevated 1.48, creatinine elevated 1.74, lactic acid elevated to 2. Chest x-ray showed right lung base consolidation compatible with pneumonia, likely in the middle lobe. No pleural effusion.  Continues to cough and has chest pain. He is unable to sleep due to the coughing. Reports the cough is productive. He has been taking mucinex. Denies shortness of breath. Every now and then he will have a coughing spell at night. Reports he is fatigued. Continued to have a fever until Sunday.      Past Medical History:  Diagnosis Date   Chronic kidney disease    stage 2   Diabetes mellitus without complication (HCC)    Hypertension    Malaise and fatigue      Family History  Problem Relation Age of Onset   Hypertension Mother    Hypothyroidism Mother    Alzheimer's disease Father    Prostate cancer Father    Prostate cancer Brother       Current Outpatient Medications:    albuterol (VENTOLIN HFA) 108 (90 Base) MCG/ACT inhaler, Inhale 2 puffs into the lungs every 6 (six) hours as needed for wheezing or shortness of breath., Disp: 8 g, Rfl: 2   amLODipine (NORVASC) 5 MG tablet, Take 1 tablet (5 mg total) by mouth daily., Disp: 90 tablet, Rfl: 1   atorvastatin (LIPITOR) 40 MG tablet, Take 40 mg by mouth daily., Disp: , Rfl:    benzonatate (TESSALON PERLES) 100 MG capsule, Take 1 capsule (100 mg total) by mouth 3 (three) times daily as needed for cough., Disp: 30 capsule, Rfl: 1   Blood Glucose Monitoring Suppl (ONE TOUCH ULTRA 2) w/Device KIT, Use as directed to check blood sugars 1 time per day dx: e11.65, Disp: 1 kit, Rfl: 1   dapagliflozin propanediol (FARXIGA) 10 MG TABS tablet, Take 1 tablet (10 mg total) by mouth daily., Disp: 90 tablet, Rfl: 3   glucose blood (ONE TOUCH ULTRA TEST) test strip, Use as instructed to check blood sugars 1 time per day dx: e11.65 dx: e11.22, Disp: 50 each, Rfl: 11   sildenafil (REVATIO) 20 MG tablet, Take 1 tablet (20 mg total) by mouth as needed. MAX 3 TO 5 TABLETS PER DAY, Disp: 60 tablet, Rfl: 1   sitaGLIPtin (JANUVIA) 100 MG tablet, Take 1 tablet (100 mg total) by mouth daily., Disp: 90 tablet, Rfl: 1   HYDROcodone bit-homatropine (HYDROMET) 5-1.5  MG/5ML syrup, Take 5 mLs by mouth every 6 (six) hours as needed for cough., Disp: 120 mL, Rfl: 0   levofloxacin (LEVAQUIN) 750 MG tablet, Take 1 tablet (750 mg total) by mouth daily for 10 days., Disp: 10 tablet, Rfl: 0  Current Facility-Administered Medications:    cefTRIAXone (ROCEPHIN) injection 1 g, 1 g, Intramuscular, Once, Minette Brine, [name]   triamcinolone acetonide (KENALOG-40) injection 40 mg, 40 mg, Intramuscular, Once, Minette Brine, [name]   No Known Allergies   Review of Systems  Constitutional: Negative.   Respiratory: Negative.    Cardiovascular: Negative.   Gastrointestinal: Negative.   Neurological: Negative.      Today's  Vitals   01/01/22 1038  BP: 128/70  Pulse: 65  Temp: 98.1 F (36.7 C)  TempSrc: Oral  SpO2: 96%  Weight: 197 lb (89.4 kg)  Height: _0  (1.778 m)   Body mass index is 28.27 kg/m.   Objective:  Physical Exam Vitals reviewed.  Constitutional:      General: He is not in acute distress.    Appearance: Normal appearance.  HENT:     Head: Normocephalic.  Cardiovascular:     Rate and Rhythm: Normal rate and regular rhythm.     Pulses: Normal pulses.     Heart sounds: Normal heart sounds. No murmur heard. Pulmonary:     Effort: Pulmonary effort is normal. No respiratory distress.     Breath sounds: No wheezing or rhonchi.     Comments: Productive cough noted Chest:     Chest wall: No tenderness.  Musculoskeletal:        General: Normal range of motion.  Skin:    General: Skin is warm and dry.     Capillary Refill: Capillary refill takes less than 2 seconds.  Neurological:     General: No focal deficit present.     Mental Status: He is alert and oriented to person, place, and time.  Psychiatric:        Mood and Affect: Mood normal.        Behavior: Behavior normal.        Thought Content: Thought content normal.        Judgment: Judgment normal.         Assessment And Plan:     1. Sepsis, due to unspecified organism, unspecified whether acute organ dysfunction present [facility]) TCM Performed. A member of the clinical team spoke with the patient upon dischare. Discharge summary was reviewed in full detail during the visit. Meds reconciled and compared to discharge meds. Medication list is updated and reviewed with the patient.  Greater than 50% face to face time was spent in counseling an coordination of care.  All questions were answered to the satisfaction of the patient.   Continues to not feel well, will treat with rocephin and kenalog. He is to repeat his CXR 4 weeks from initial. - cefTRIAXone (ROCEPHIN) injection 1 g - CBC - triamcinolone acetonide (KENALOG-40)  injection 40 mg  2. Pneumonia of right lower lobe due to infectious organism - DG Chest 2 View; Future - CBC - triamcinolone acetonide (KENALOG-40) injection 40 mg  3. AKI (acute kidney injury) ([place]) Comments: Will recheck kidney functions, likely related to his sepsis/pneumonia  4. Type 2 diabetes mellitus with stage 2 chronic kidney disease, without long-term current use of insulin (HCC) Comments: HgbA1c was slightly up at last visit, continue current medications.  - Hemoglobin A1c  5. Hypertensive nephropathy Comments: Blood pressure is well controlled, continue  current medications  6. Mixed hyperlipidemia Comments: Cholesterol levels were improved at last visit, continue statin, tolerating well.     Patient was given opportunity to ask questions. Patient verbalized understanding of the plan and was able to repeat key elements of the plan. All questions were answered to their satisfaction.  Minette Brine, [name]   I, Minette Brine, [name], have reviewed all documentation for this visit. The documentation on 01/01/22 for the exam, diagnosis, procedures, and orders are all accurate and complete.   IF YOU HAVE BEEN REFERRED TO A SPECIALIST, IT MAY TAKE 1-2 WEEKS TO SCHEDULE/PROCESS THE REFERRAL. IF YOU HAVE NOT HEARD FROM US/SPECIALIST IN TWO WEEKS, PLEASE GIVE [place] A CALL AT [phone] X [phone].   THE PATIENT IS ENCOURAGED TO PRACTICE SOCIAL DISTANCING DUE TO THE COVID-19 PANDEMIC.

## 2022-01-01 NOTE — Patient Instructions (Signed)
Aspiration Pneumonia, Adult  Aspiration pneumonia is an infection that occurs after lung (pulmonary) aspiration. Pulmonary aspiration is when you inhale a large amount of food, liquid, stomach acid, or saliva into the lungs. This can cause inflammation and infection in the lungs. This can make you cough and make it hard to breathe. Aspiration pneumonia is a serious condition and can be life-threatening. What are the causes? This condition may be caused by: Bacteria in food, liquid, stomach acid, or saliva that is inhaled into the lung. Irritation and inflammation that results from material, such as blood or a foreign body, being inhaled into the lung. This can lead to an infection even though the material is not originally contaminated with bacteria. What increases the risk? You are more likely to get aspiration pneumonia if you have a condition that makes it hard to breathe, swallow, cough, or gag. These conditions may include: A breathing disorder, such as chronic obstructive pulmonary disease, that makes it hard to eat or drink while breathing. A brain (neurologic) disorder, such as stroke, seizures, Parkinson's disease, dementia, amyotrophic lateral sclerosis (ALS), or brain injury. Having gastroesophageal reflux disease (GERD). Having a weak disease-fighting system (immune system). Having a narrowing of the tube that carries food to the stomach (esophageal narrowing). Other factors that may make you more likely to get aspiration pneumonia include: Being older than age 60 and frail. Being given a general anesthetic for procedures. Drinking too much alcohol and passing out. If you pass out and vomit, then vomit can be inhaled into your lungs. Taking certain medicines, such as tranquilizers or sedatives. Taking poor care of your mouth and teeth. Being malnourished. What are the signs or symptoms? The main symptom of pulmonary aspiration may be an episode of choking or coughing while eating or  drinking. When aspiration pneumonia develops, symptoms include: Persistent cough. Difficulty breathing, such as wheezing or shortness of breath. Fever. Chest pain. Being more tired than usual (fatigue). Pulmonary aspiration may be silent, meaning that it is not associated with coughing or choking while eating or drinking. How is this diagnosed? This condition may be diagnosed based on: A physical exam. Tests, such as: A chest X-ray. A sputum culture. Saliva and mucus (sputum) are collected from the lungs or the tubes that carry air to the lungs (bronchi). The sputum is then tested for bacteria. Oximetry. A sensor or clip is placed on areas such as a finger, earlobe, or toe to measure the oxygen level in your blood. Blood tests. A swallowing study. This test looks at how food is swallowed and whether it goes into your windpipe (trachea) or esophagus. A bronchoscopy. This test uses a flexible tube (bronchoscope) to see inside the lungs. How is this treated? This condition may be treated with: Medicines. Antibiotic medicine will be given to kill the pneumonia bacteria. Other medicines may also be used to reduce fever, pain, or inflammation. Breathing assistance and oxygen therapy. Depending on how well you are breathing, you may need to be given oxygen, or you may need breathing support from a breathing machine (ventilator). Thoracentesis. This is a procedure to remove fluid that has built up in the space between the linings of the chest wall and the lungs. Dietary changes. You may need to avoid certain food textures or liquids. For people who have recurrent aspiration pneumonia, a feeding tube might be placed in the stomach for nutrition. Follow these instructions at home: Medicines  Take over-the-counter and prescription medicines only as told by your health care   provider. If you were prescribed an antibiotic medicine, take it as told by your health care provider. Do not stop taking the  antibiotic even if you start to feel better. Take cough medicine only if you are losing sleep. Cough medicine can prevent your body's natural ability to remove mucus from your lungs. General instructions  Carefully follow any eating instructions you were given, such as avoiding certain food textures or thickening your liquids. Thickening liquids reduces the risk of developing aspiration pneumonia again. Return to normal activities as told by your health care provider. Ask your health care provider what activities are safe for you. Sleep in a semi-upright position at night. Try to sleep in a reclining chair, or place a few pillows under your head in bed. Do not use any products that contain nicotine or tobacco, such as cigarettes, e-cigarettes, and chewing tobacco. If you need help quitting, ask your health care provider. Keep all follow-up visits as told by your health care provider. This is important. Contact a health care provider if you: Have a fever. Are coughing or choking while eating or drinking. Continue to have signs or symptoms of aspiration pneumonia. Get help right away if you have: Worsening shortness of breath, wheezing, or difficulty breathing. Chest pain. Summary Aspiration pneumonia is an infection that occurs after lung (pulmonary) aspiration. Pulmonary aspiration is when you inhale a large amount of food, liquid, stomach acid, or saliva into the lungs. The main symptom of pulmonary aspiration may be an episode of choking or coughing. It may also be silent without coughing or choking. You are more likely to get aspiration pneumonia if you have a condition that makes it hard to breathe, swallow, cough, or gag. This information is not intended to replace advice given to you by your health care provider. Make sure you discuss any questions you have with your health care provider. Document Revised: 07/29/2019 Document Reviewed: 07/29/2019 Elsevier Patient Education  2023 [employer]  

## 2022-01-02 LAB — CBC
Hematocrit: 38 % (ref 37.5–51.0)
Hemoglobin: 13 g/dL (ref 13.0–17.7)
MCH: 29.7 pg (ref 26.6–33.0)
MCHC: 34.2 g/dL (ref 31.5–35.7)
MCV: 87 fL (ref 79–97)
Platelets: 402 10*3/uL (ref 150–450)
RBC: 4.38 x10E6/uL (ref 4.14–5.80)
RDW: 13.1 % (ref 11.6–15.4)
WBC: 13 10*3/uL — ABNORMAL HIGH (ref 3.4–10.8)

## 2022-01-02 LAB — HEMOGLOBIN A1C
Est. average glucose Bld gHb Est-mCnc: 206 mg/dL
Hgb A1c MFr Bld: 8.8 % — ABNORMAL HIGH (ref 4.8–5.6)

## 2022-01-22 ENCOUNTER — Ambulatory Visit
Admission: RE | Admit: 2022-01-22 | Discharge: 2022-01-22 | Disposition: A | Discharge status: Home / Self Care | Payer: Medicare HMO | Source: Ambulatory Visit | Attending: Nurse Practitioner | Admitting: Nurse Practitioner

## 2022-01-22 DIAGNOSIS — J189 Pneumonia, unspecified organism: Secondary | ICD-10-CM

## 2022-01-22 DIAGNOSIS — R059 Cough, unspecified: Secondary | ICD-10-CM | POA: Diagnosis not present

## 2022-01-23 ENCOUNTER — Telehealth: Payer: Self-pay

## 2022-01-23 ENCOUNTER — Other Ambulatory Visit: Payer: Self-pay | Admitting: Internal Medicine

## 2022-01-23 DIAGNOSIS — J189 Pneumonia, unspecified organism: Secondary | ICD-10-CM

## 2022-01-23 DIAGNOSIS — R9389 Abnormal findings on diagnostic imaging of other specified body structures: Secondary | ICD-10-CM

## 2022-01-27 ENCOUNTER — Other Ambulatory Visit: Payer: Self-pay | Admitting: Nurse Practitioner

## 2022-01-27 NOTE — Addendum Note (Signed)
Addended by: [name] F on: 01/27/2022 06:09 PM   Modules accepted: Orders

## 2022-02-04 ENCOUNTER — Ambulatory Visit
Admission: RE | Admit: 2022-02-04 | Discharge: 2022-02-04 | Disposition: A | Discharge status: Home / Self Care | Payer: Medicare HMO | Source: Ambulatory Visit | Attending: Nurse Practitioner | Admitting: Nurse Practitioner

## 2022-02-04 DIAGNOSIS — J181 Lobar pneumonia, unspecified organism: Secondary | ICD-10-CM | POA: Diagnosis not present

## 2022-02-04 DIAGNOSIS — J189 Pneumonia, unspecified organism: Secondary | ICD-10-CM

## 2022-02-04 DIAGNOSIS — R9389 Abnormal findings on diagnostic imaging of other specified body structures: Secondary | ICD-10-CM

## 2022-02-04 DIAGNOSIS — R918 Other nonspecific abnormal finding of lung field: Secondary | ICD-10-CM | POA: Diagnosis not present

## 2022-02-04 MED ORDER — IOPAMIDOL (ISOVUE-300) INJECTION 61%
75.0000 mL | Freq: Once | INTRAVENOUS | Status: AC | PRN
  Administered 2022-02-04: 75 mL via INTRAVENOUS

## 2022-02-05 ENCOUNTER — Other Ambulatory Visit: Payer: Self-pay | Admitting: Nurse Practitioner

## 2022-02-06 ENCOUNTER — Other Ambulatory Visit: Payer: Self-pay | Admitting: Nurse Practitioner

## 2022-02-06 ENCOUNTER — Other Ambulatory Visit: Payer: Self-pay

## 2022-02-06 DIAGNOSIS — J189 Pneumonia, unspecified organism: Secondary | ICD-10-CM

## 2022-02-06 MED ORDER — LEVOFLOXACIN 750 MG PO TABS: 750.0000 mg | ORAL_TABLET | Freq: Every day | ORAL | 0 refills | Status: DC

## 2022-02-06 MED ORDER — LEVOFLOXACIN 750 MG PO TABS: 750.0000 mg | ORAL_TABLET | Freq: Every day | ORAL | 0 refills | Status: AC

## 2022-02-13 ENCOUNTER — Telehealth: Payer: Self-pay

## 2022-02-13 NOTE — Chronic Care Management (AMB) (Signed)
02-13-2022: Patient was informed to come by [facility] next week to sign attestation form for [place] patient assistance. Patient stated he never received attestation in the mail.  Huey Romans [facility] Clinical Pharmacist Assistant [phone]

## 2022-02-19 ENCOUNTER — Other Ambulatory Visit: Payer: Self-pay | Admitting: Internal Medicine

## 2022-02-27 ENCOUNTER — Telehealth: Payer: Self-pay

## 2022-02-27 NOTE — Chronic Care Management (AMB) (Addendum)
Care Gap(s) Not Met that Need to be Addressed:  Colorectal Cancer Screening   Action Taken: Patient refuses colonoscopy but has had cologuard done in the past.   Follow Up: None needed   Care Gap(s) Not Met that Need to be Addressed:  Kidney Health Evaluation for Patients With Diabetes  Action Taken: Review chart. eGFR 03-17-2022 44. Last Albumin/Creatinine Ratio 07-10-2021 30.  Follow Up: follow up when due per [place] Pharmacist Assistant [phone]

## 2022-03-12 ENCOUNTER — Ambulatory Visit (INDEPENDENT_AMBULATORY_CARE_PROVIDER_SITE_OTHER): Payer: Medicare HMO

## 2022-03-12 ENCOUNTER — Telehealth: Payer: Self-pay

## 2022-03-12 VITALS — Ht 70.0 in | Wt 187.0 lb

## 2022-03-12 DIAGNOSIS — Z0000 Encounter for general adult medical examination without abnormal findings: Secondary | ICD-10-CM | POA: Diagnosis not present

## 2022-03-12 DIAGNOSIS — Z1211 Encounter for screening for malignant neoplasm of colon: Secondary | ICD-10-CM | POA: Diagnosis not present

## 2022-03-12 NOTE — Addendum Note (Signed)
Addended by: Barb Merino on: 03/12/2022 03:56 PM   Modules accepted: Orders

## 2022-03-12 NOTE — Chronic Care Management (AMB) (Signed)
   [name] was reminded to have all medications, supplements and any blood glucose and blood pressure readings available for review with [name], Pharm. D, at his telephone visit on 03-14-2022 at 9:00.    Huey Romans [facility] Clinical Pharmacist Assistant [phone]

## 2022-03-12 NOTE — Progress Notes (Signed)
I connected with [name] today by telephone and verified that I am speaking with the correct person using two identifiers. Location patient: home Location provider: work Persons participating in the virtual visit: [place], Hufstetler LPN.   I discussed the limitations, risks, security and privacy concerns of performing an evaluation and management service by telephone and the availability of in person appointments. I also discussed with the patient that there may be a patient responsible charge related to this service. The patient expressed understanding and verbally consented to this telephonic visit.    Interactive audio and video telecommunications were attempted between this provider and patient, however failed, due to patient having technical difficulties OR patient did not have access to video capability.  We continued and completed visit with audio only.     Vital signs may be patient reported or missing.  Subjective:   Warren [name] is a 76 y.o. male who presents for Medicare Annual/Subsequent preventive examination.  Review of Systems     Cardiac Risk Factors include: advanced age (>73mn, >>34women);diabetes mellitus;dyslipidemia;hypertension;male gender     Objective:    Today's Vitals   03/12/22 1526  Weight: 187 lb (84.8 kg)  Height: _0  (1.778 m)   Body mass index is 26.83 kg/m.     03/12/2022    3:34 PM 12/25/2021    3:27 PM 12/24/2021    6:53 PM 12/24/2021    9:57 AM 12/13/2020    9:08 AM 07/12/2019    8:52 AM 05/26/2018   10:09 AM  Advanced Directives  Does Patient Have a Medical Advance Directive? _1  No No  Would patient like information on creating a medical advance directive?  No - Patient declined   No - Patient declined No - Patient declined Yes (MAU/Ambulatory/Procedural Areas - Information given)    Current Medications (verified) Outpatient Encounter Medications as of 03/12/2022  Medication Sig   ACCU-CHEK GUIDE test  strip Use as instructed to check blood sugars 1 time per day   albuterol (VENTOLIN HFA) 108 (90 Base) MCG/ACT inhaler Inhale 2 puffs into the lungs every 6 (six) hours as needed for wheezing or shortness of breath.   amLODipine (NORVASC) 5 MG tablet Take 1 tablet (5 mg total) by mouth daily.   atorvastatin (LIPITOR) 40 MG tablet Take 40 mg by mouth daily.   dapagliflozin propanediol (FARXIGA) 10 MG TABS tablet Take 1 tablet (10 mg total) by mouth daily.   sildenafil (REVATIO) 20 MG tablet Take 1 tablet (20 mg total) by mouth as needed. MAX 3 TO 5 TABLETS PER DAY   benzonatate (TESSALON PERLES) 100 MG capsule Take 1 capsule (100 mg total) by mouth 3 (three) times daily as needed for cough. (Patient not taking: Reported on 03/12/2022)   Blood Glucose Monitoring Suppl (ONE TOUCH ULTRA 2) w/Device KIT Use as directed to check blood sugars 1 time per day dx: e11.65   HYDROcodone bit-homatropine (HYDROMET) 5-1.5 MG/5ML syrup Take 5 mLs by mouth every 6 (six) hours as needed for cough. (Patient not taking: Reported on 03/12/2022)   sitaGLIPtin (JANUVIA) 100 MG tablet Take 1 tablet (100 mg total) by mouth daily. (Patient not taking: Reported on 03/12/2022)   Facility-Administered Encounter Medications as of 03/12/2022  Medication   cefTRIAXone (ROCEPHIN) injection 1 g   triamcinolone acetonide (KENALOG-40) injection 40 mg    Allergies (verified) Patient has no known allergies.   History: Past Medical History:  Diagnosis Date   Chronic kidney disease  stage 2   Diabetes mellitus without complication (HCC)    Hypertension    Malaise and fatigue    Past Surgical History:  Procedure Laterality Date   surgical repair     metatarsal repair.    Family History  Problem Relation Age of Onset   Hypertension Mother    Hypothyroidism Mother    Alzheimer's disease Father    Prostate cancer Father    Prostate cancer Brother    Social History   Socioeconomic History   Marital status: Single     Spouse name: Not on file   Number of children: Not on file   Years of education: Not on file   Highest education level: Not on file  Occupational History   Occupation: retired  Tobacco Use   Smoking status: Never   Smokeless tobacco: Never  Vaping Use   Vaping Use: Never used  Substance and Sexual Activity   Alcohol use: Yes    Alcohol/week: 28.0 standard drinks of alcohol    Types: 21 Cans of beer, 7 Shots of liquor per week    Comment: daily   Drug use: Yes    Types: Marijuana   Sexual activity: Yes  Other Topics Concern   Not on file  Social History Narrative   ** Merged History Encounter **       Social Determinants of Health   Financial Resource Strain: Low Risk  (03/12/2022)   Overall Financial Resource Strain (CARDIA)    Difficulty of Paying Living Expenses: Not hard at all  Food Insecurity: No Food Insecurity (03/12/2022)   Hunger Vital Sign    Worried About Running Out of Food in the Last Year: Never true    Ran Out of Food in the Last Year: Never true  Transportation Needs: No Transportation Needs (03/12/2022)   PRAPARE - Hydrologist (Medical): No    Lack of Transportation (Non-Medical): No  Physical Activity: Inactive (03/12/2022)   Exercise Vital Sign    Days of Exercise per Week: 0 days    Minutes of Exercise per Session: 0 min  Stress: No Stress Concern Present (03/12/2022)   [place]    Feeling of Stress : Not at all  Social Connections: Not on file    Tobacco Counseling Counseling given: Not Answered   Clinical Intake:  Pre-visit preparation completed: Yes  Pain : No/denies pain     Nutritional Risks: None Diabetes: Yes  How often do you need to have someone help you when you read instructions, pamphlets, or other written materials from your doctor or pharmacy?: 1 - Never  Diabetic? Yes Nutrition Risk Assessment:  Has the patient had any N/V/D  within the last 2 months?  No  Does the patient have any non-healing wounds?  No  Has the patient had any unintentional weight loss or weight gain?  No   Diabetes:  Is the patient diabetic?  Yes  If diabetic, was a CBG obtained today?  No  Did the patient bring in their glucometer from home?  No  How often do you monitor your CBG's? 3 times a week.   Financial Strains and Diabetes Management:  Are you having any financial strains with the device, your supplies or your medication? Yes .  Does the patient want to be seen by Chronic Care Management for management of their diabetes?  Yes  Would the patient like to be referred to a Nutritionist or  for Diabetic Management?  No   Diabetic Exams:  Diabetic Eye Exam: Overdue for diabetic eye exam. Pt has been advised about the importance in completing this exam. Patient advised to call and schedule an eye exam. Diabetic Foot Exam: Completed 07/10/2021   Interpreter Needed?: No  Information entered by :: NAllen LPN   Activities of Daily Living    03/12/2022    3:36 PM 12/25/2021    3:27 PM  In your present state of health, do you have any difficulty performing the following activities:  Hearing? 0 0  Vision? 0 0  Comment uses reading glasses   Difficulty concentrating or making decisions? 0 0  Walking or climbing stairs? 0 0  Dressing or bathing? 0 0  Doing errands, shopping? 0 0  Preparing Food and eating ? N   Using the Toilet? N   In the past six months, have you accidently leaked urine? Y   Do you have problems with loss of bowel control? N   Managing your Medications? N   Managing your Finances? N   Housekeeping or managing your Housekeeping? N     Patient Care Team: Glendale Chard, MD as PCP - General (Internal Medicine) Mayford Knife, [facility] (Pharmacist)  Indicate any recent Medical Services you may have received from other than [facility] providers in the past year (date may be approximate).     Assessment:   This is a  routine wellness examination for Demar.  Hearing/Vision screen Vision Screening - Comments:: No regular eye exams,[facility]  Dietary issues and exercise activities discussed: Current Exercise Habits: The patient does not participate in regular exercise at present   Goals Addressed             This Visit's Progress    Patient Stated       03/12/2022, stay healthy       Depression Screen    03/12/2022    3:36 PM 01/01/2022   10:36 AM 12/13/2020    9:09 AM 12/13/2020    8:36 AM 07/12/2019    8:54 AM 02/03/2019    3:18 PM 10/13/2018   11:21 AM  PHQ 2/9 Scores  PHQ - 2 Score 0 0 0 0 0 0 0  PHQ- 9 Score     0      Fall Risk    03/12/2022    3:35 PM 01/01/2022   10:36 AM 12/13/2020    9:09 AM 12/13/2020    8:36 AM 07/12/2019    8:54 AM  Fall Risk   Falls in the past year? 0 0 0 0 0  Number falls in past yr: 0 0     Injury with Fall? 0 0     Risk for fall due to : Medication side effect No Fall Risks No Fall Risks  Medication side effect  Follow up Falls evaluation completed;Education provided;Falls prevention discussed Falls evaluation completed Falls evaluation completed;Education provided;Falls prevention discussed  Falls evaluation completed;Education provided;Falls prevention discussed    FALL RISK PREVENTION PERTAINING TO THE HOME:  Any stairs in or around the home? Yes  If so, are there any without handrails? No  Home free of loose throw rugs in walkways, pet beds, electrical cords, etc? Yes  Adequate lighting in your home to reduce risk of falls? Yes   ASSISTIVE DEVICES UTILIZED TO PREVENT FALLS:  Life alert? No  Use of a cane, walker or w/c? No  Grab bars in the bathroom? No  Shower chair or bench  in shower? Yes  Elevated toilet seat or a handicapped toilet? Yes   TIMED UP AND GO:  Was the test performed? No .      Cognitive Function:        03/12/2022    3:38 PM 12/13/2020    9:10 AM 07/12/2019    8:57 AM 05/26/2018   10:14 AM  6CIT Screen  What  Year? 0 points 0 points 0 points 0 points  What month? 0 points 0 points 0 points 0 points  What time? 0 points 0 points 0 points 0 points  Count back from 20 0 points 0 points 0 points 0 points  Months in reverse 0 points 0 points 0 points 0 points  Repeat phrase 0 points 2 points 2 points 0 points  Total Score 0 points 2 points 2 points 0 points    Immunizations Immunization History  Administered Date(s) Administered   PFIZER(Purple Top)SARS-COV-2 Vaccination 12/01/2019, 12/26/2019, 06/29/2020   Tdap 07/19/2013    TDAP status: Up to date  Flu Vaccine status: Declined, Education has been provided regarding the importance of this vaccine but patient still declined. Advised may receive this vaccine at local pharmacy or Health Dept. Aware to provide a copy of the vaccination record if obtained from local pharmacy or Health Dept. Verbalized acceptance and understanding.  Pneumococcal vaccine status: Due, Education has been provided regarding the importance of this vaccine. Advised may receive this vaccine at local pharmacy or Health Dept. Aware to provide a copy of the vaccination record if obtained from local pharmacy or Health Dept. Verbalized acceptance and understanding.  Covid-19 vaccine status: Completed vaccines  Qualifies for Shingles Vaccine? Yes   Zostavax completed No   Shingrix Completed?: No.    Education has been provided regarding the importance of this vaccine. Patient has been advised to call insurance company to determine out of pocket expense if they have not yet received this vaccine. Advised may also receive vaccine at local pharmacy or Health Dept. Verbalized acceptance and understanding.  Screening Tests Health Maintenance  Topic Date Due   Zoster Vaccines- Shingrix (1 of 2) Never done   OPHTHALMOLOGY EXAM  04/09/2019   COVID-19 Vaccine (4 - Pfizer series) 08/24/2020   Fecal DNA (Cologuard)  11/09/2021   INFLUENZA VACCINE  03/11/2022   Pneumonia Vaccine 82+  Years old (1 - PCV) 06/03/2022 (Originally 04/14/2013)   HEMOGLOBIN A1C  07/04/2022   FOOT EXAM  07/10/2022   URINE MICROALBUMIN  07/10/2022   TETANUS/TDAP  07/20/2023   Hepatitis C Screening  Completed   HPV VACCINES  Aged Out    Health Maintenance  Health Maintenance Due  Topic Date Due   Zoster Vaccines- Shingrix (1 of 2) Never done   OPHTHALMOLOGY EXAM  04/09/2019   COVID-19 Vaccine (4 - Pfizer series) 08/24/2020   Fecal DNA (Cologuard)  11/09/2021   INFLUENZA VACCINE  03/11/2022    Colorectal cancer screening: cologuard ordered today  Lung Cancer Screening: (Low Dose CT Chest recommended if Age 4-80 years, 30 pack-year currently smoking OR have quit w/in 15years.) does not qualify.   Lung Cancer Screening Referral: no  Additional Screening:  Hepatitis C Screening: does qualify; Completed 07/06/2018  Vision Screening: Recommended annual ophthalmology exams for early detection of glaucoma and other disorders of the eye. Is the patient up to date with their annual eye exam?  No  Who is the provider or what is the name of the office in which the patient attends annual eye exams? [facility] If pt is not established with a provider, would they like to be referred to a provider to establish care? No .   Dental Screening: Recommended annual dental exams for proper oral hygiene  Community Resource Referral / Chronic Care Management: CRR required this visit?  No   CCM required this visit?  No      Plan:     I have personally reviewed and noted the following in the patient's chart:   Medical and social history Use of alcohol, tobacco or illicit drugs  Current medications and supplements including opioid prescriptions. Patient is not currently taking opioid prescriptions. Functional ability and status Nutritional status Physical activity Advanced directives List of other physicians Hospitalizations, surgeries, and ER visits in previous 12  months Vitals Screenings to include cognitive, depression, and falls Referrals and appointments  In addition, I have reviewed and discussed with patient certain preventive protocols, quality metrics, and best practice recommendations. A written personalized care plan for preventive services as well as general preventive health recommendations were provided to patient.     Kellie Simmering, LPN   04/17/2951   Nurse Notes: none  Due to this being a virtual visit, the after visit summary with patients personalized plan was offered to patient via mail or my-chart.  to pick up at office at next visit

## 2022-03-12 NOTE — Patient Instructions (Signed)
Warren [name] , Thank you for taking time to come for your Medicare Wellness Visit. I appreciate your ongoing commitment to your health goals. Please review the following plan we discussed and let me know if I can assist you in the future.   Screening recommendations/referrals: Colonoscopy: cologuard ordered today Recommended yearly ophthalmology/optometry visit for glaucoma screening and checkup Recommended yearly dental visit for hygiene and checkup  Vaccinations: Influenza vaccine: decline Pneumococcal vaccine: due Tdap vaccine: completed 07/19/2013, due 07/20/2023 Shingles vaccine: discussed   Covid-19:  06/19/2020, 12/26/2019, 12/01/2019  Advanced directives: Advance directive discussed with you today.   Conditions/risks identified: none  Next appointment: Follow up in one year for your annual wellness visit.   Preventive Care 74 Years and Older, Male Preventive care refers to lifestyle choices and visits with your health care provider that can promote health and wellness. What does preventive care include? A yearly physical exam. This is also called an annual well check. Dental exams once or twice a year. Routine eye exams. Ask your health care provider how often you should have your eyes checked. Personal lifestyle choices, including: Daily care of your teeth and gums. Regular physical activity. Eating a healthy diet. Avoiding tobacco and drug use. Limiting alcohol use. Practicing safe sex. Taking low doses of aspirin every day. Taking vitamin and mineral supplements as recommended by your health care provider. What happens during an annual well check? The services and screenings done by your health care provider during your annual well check will depend on your age, overall health, lifestyle risk factors, and family history of disease. Counseling  Your health care provider may ask you questions about your: Alcohol use. Tobacco use. Drug use. Emotional well-being. Home and  relationship well-being. Sexual activity. Eating habits. History of falls. Memory and ability to understand (cognition). Work and work Astronomer. Screening  You may have the following tests or measurements: Height, weight, and BMI. Blood pressure. Lipid and cholesterol levels. These may be checked every 5 years, or more frequently if you are over 74 years old. Skin check. Lung cancer screening. You may have this screening every year starting at age 74 if you have a 30-pack-year history of smoking and currently smoke or have quit within the past 15 years. Fecal occult blood test (FOBT) of the stool. You may have this test every year starting at age 74. Flexible sigmoidoscopy or colonoscopy. You may have a sigmoidoscopy every 5 years or a colonoscopy every 10 years starting at age 74. Prostate cancer screening. Recommendations will vary depending on your family history and other risks. Hepatitis C blood test. Hepatitis B blood test. Sexually transmitted disease (STD) testing. Diabetes screening. This is done by checking your blood sugar (glucose) after you have not eaten for a while (fasting). You may have this done every 1-3 years. Abdominal aortic aneurysm (AAA) screening. You may need this if you are a current or former smoker. Osteoporosis. You may be screened starting at age 74 if you are at high risk. Talk with your health care provider about your test results, treatment options, and if necessary, the need for more tests. Vaccines  Your health care provider may recommend certain vaccines, such as: Influenza vaccine. This is recommended every year. Tetanus, diphtheria, and acellular pertussis (Tdap, Td) vaccine. You may need a Td booster every 10 years. Zoster vaccine. You may need this after age 74. Pneumococcal 13-valent conjugate (PCV13) vaccine. One dose is recommended after age 74. Pneumococcal polysaccharide (PPSV23) vaccine. One dose is recommended after  age 74. Talk to your  health care provider about which screenings and vaccines you need and how often you need them. This information is not intended to replace advice given to you by your health care provider. Make sure you discuss any questions you have with your health care provider. Document Released: 08/24/2015 Document Revised: 04/16/2016 Document Reviewed: 05/29/2015 Elsevier Interactive Patient Education  2017 [place] Prevention in the Home Falls can cause injuries. They can happen to people of all ages. There are many things you can do to make your home safe and to help prevent falls. What can I do on the outside of my home? Regularly fix the edges of walkways and driveways and fix any cracks. Remove anything that might make you trip as you walk through a door, such as a raised step or threshold. Trim any bushes or trees on the path to your home. Use bright outdoor lighting. Clear any walking paths of anything that might make someone trip, such as rocks or tools. Regularly check to see if handrails are loose or broken. Make sure that both sides of any steps have handrails. Any raised decks and porches should have guardrails on the edges. Have any leaves, snow, or ice cleared regularly. Use sand or salt on walking paths during winter. Clean up any spills in your garage right away. This includes oil or grease spills. What can I do in the bathroom? Use night lights. Install grab bars by the toilet and in the tub and shower. Do not use towel bars as grab bars. Use non-skid mats or decals in the tub or shower. If you need to sit down in the shower, use a plastic, non-slip stool. Keep the floor dry. Clean up any water that spills on the floor as soon as it happens. Remove soap buildup in the tub or shower regularly. Attach bath mats securely with double-sided non-slip rug tape. Do not have throw rugs and other things on the floor that can make you trip. What can I do in the bedroom? Use night  lights. Make sure that you have a light by your bed that is easy to reach. Do not use any sheets or blankets that are too big for your bed. They should not hang down onto the floor. Have a firm chair that has side arms. You can use this for support while you get dressed. Do not have throw rugs and other things on the floor that can make you trip. What can I do in the kitchen? Clean up any spills right away. Avoid walking on wet floors. Keep items that you use a lot in easy-to-reach places. If you need to reach something above you, use a strong step stool that has a grab bar. Keep electrical cords out of the way. Do not use floor polish or wax that makes floors slippery. If you must use wax, use non-skid floor wax. Do not have throw rugs and other things on the floor that can make you trip. What can I do with my stairs? Do not leave any items on the stairs. Make sure that there are handrails on both sides of the stairs and use them. Fix handrails that are broken or loose. Make sure that handrails are as long as the stairways. Check any carpeting to make sure that it is firmly attached to the stairs. Fix any carpet that is loose or worn. Avoid having throw rugs at the top or bottom of the stairs. If you do  have throw rugs, attach them to the floor with carpet tape. Make sure that you have a light switch at the top of the stairs and the bottom of the stairs. If you do not have them, ask someone to add them for you. What else can I do to help prevent falls? Wear shoes that: Do not have high heels. Have rubber bottoms. Are comfortable and fit you well. Are closed at the toe. Do not wear sandals. If you use a stepladder: Make sure that it is fully opened. Do not climb a closed stepladder. Make sure that both sides of the stepladder are locked into place. Ask someone to hold it for you, if possible. Clearly mark and make sure that you can see: Any grab bars or handrails. First and last  steps. Where the edge of each step is. Use tools that help you move around (mobility aids) if they are needed. These include: Canes. Walkers. Scooters. Crutches. Turn on the lights when you go into a dark area. Replace any light bulbs as soon as they burn out. Set up your furniture so you have a clear path. Avoid moving your furniture around. If any of your floors are uneven, fix them. If there are any pets around you, be aware of where they are. Review your medicines with your doctor. Some medicines can make you feel dizzy. This can increase your chance of falling. Ask your doctor what other things that you can do to help prevent falls. This information is not intended to replace advice given to you by your health care provider. Make sure you discuss any questions you have with your health care provider. Document Released: 05/24/2009 Document Revised: 01/03/2016 Document Reviewed: 09/01/2014 Elsevier Interactive Patient Education  2017 [employer].

## 2022-03-13 ENCOUNTER — Institutional Professional Consult (permissible substitution): Payer: Medicare HMO | Admitting: Pulmonary Disease

## 2022-03-14 ENCOUNTER — Ambulatory Visit (INDEPENDENT_AMBULATORY_CARE_PROVIDER_SITE_OTHER): Payer: Medicare HMO

## 2022-03-14 DIAGNOSIS — I129 Hypertensive chronic kidney disease with stage 1 through stage 4 chronic kidney disease, or unspecified chronic kidney disease: Secondary | ICD-10-CM

## 2022-03-14 DIAGNOSIS — E1122 Type 2 diabetes mellitus with diabetic chronic kidney disease: Secondary | ICD-10-CM

## 2022-03-14 NOTE — Progress Notes (Signed)
Chronic Care Management Pharmacy Note  03/18/2022 Name:  Warren [name] MRN:  [id number] DOB:  12-30-1945  Summary: Patient reports that he is doing okay but concerned about   Recommendations/Changes made from today's visit: Recommend patient keep pulmonology appointment  Recommend patient continue to drink plenty of water  Plan: Patient would like referral sent for colonoscopy,  Warren [name] is interested in trying Metformin while we wait for approval for Januvia    Subjective: Warren [name] is an 76 y.o. year old male who is a primary patient of Glendale Chard, MD.  The CCM team was consulted for assistance with disease management and care coordination needs.    Engaged with patient by telephone for follow up visit in response to provider referral for pharmacy case management and/or care coordination services. Patient cancelled pulmonary appointment because he does not have the resource to leave his mother alone. His daughter rescheduled his pulmonology appointment  Consent to Services:  The patient was given information about Chronic Care Management services, agreed to services, and gave verbal consent prior to initiation of services.  Please see initial visit note for detailed documentation.   Patient Care Team: Glendale Chard, MD as PCP - General (Internal Medicine) Mayford Knife, [facility] (Pharmacist)  Recent office visits: 01/01/2022 PCP appointment    Hospital visits: 12/24/2021 Hospital Admission    Objective:  Lab Results  Component Value Date   CREATININE 1.63 (H) 03/17/2022   BUN 23 03/17/2022   EGFR 44 (L) 03/17/2022   GFRNONAA 54 (L) 12/26/2021   GFRAA 68 02/03/2019   NA 142 03/17/2022   K 5.1 03/17/2022   CALCIUM 10.3 (H) 03/17/2022   CO2 20 03/17/2022   GLUCOSE 171 (H) 03/17/2022    Lab Results  Component Value Date/Time   HGBA1C 9.7 (H) 03/17/2022 02:03 PM   HGBA1C 8.8 (H) 01/01/2022 11:49 AM   MICROALBUR 10 07/10/2021 10:17 AM   MICROALBUR  30 03/26/2020 03:14 PM    Last diabetic Eye exam: No results found for: "HMDIABEYEEXA"  Last diabetic Foot exam: No results found for: "HMDIABFOOTEX"   Lab Results  Component Value Date   CHOL 197 07/10/2021   HDL 72 07/10/2021   LDLCALC 98 07/10/2021   TRIG 157 (H) 07/10/2021   CHOLHDL 2.7 07/10/2021       Latest Ref Rng & Units 03/17/2022    2:03 PM 10/03/2021    9:38 AM 06/03/2021   10:09 AM  Hepatic Function  Total Protein 6.0 - 8.5 g/dL 7.4  6.6  7.2   Albumin 3.8 - 4.8 g/dL 4.5  4.3  4.2   AST 0 - 40 IU/L _0 ALT 0 - 44 IU/L _1 Alk Phosphatase 44 - 121 IU/L 66  61  68   Total Bilirubin 0.0 - 1.2 mg/dL 0.5  0.9  0.7     Lab Results  Component Value Date/Time   TSH 1.60 10/01/2017 12:00 AM       Latest Ref Rng & Units 01/01/2022   11:49 AM 12/26/2021    2:39 AM 12/25/2021    3:05 AM  CBC  WBC 3.4 - 10.8 x10E3/uL 13.0  18.7  24.3   Hemoglobin 13.0 - 17.7 g/dL 13.0  11.7  13.2   Hematocrit 37.5 - 51.0 % 38.0  33.4  38.0   Platelets 150 - 450 x10E3/uL 402  182  186     Lab Results  Component Value Date/Time   VD25OH 36.1 02/03/2018 12:00 AM    Clinical ASCVD: No  The ASCVD Risk score (Arnett DK, et al., 2019) failed to calculate for the following reasons:   Unable to determine if patient is Non-Hispanic African American       03/12/2022    3:36 PM 01/01/2022   10:36 AM 12/13/2020    9:09 AM  Depression screen PHQ 2/9  Decreased Interest 0 0 0  Down, Depressed, Hopeless 0 0 0  PHQ - 2 Score 0 0 0     Social History   Tobacco Use  Smoking Status Never  Smokeless Tobacco Never   BP Readings from Last 3 Encounters:  03/17/22 118/80  01/01/22 128/70  12/27/21 (!) 149/73   Pulse Readings from Last 3 Encounters:  03/17/22 68  01/01/22 65  12/27/21 62   Wt Readings from Last 3 Encounters:  03/17/22 190 lb (86.2 kg)  03/12/22 187 lb (84.8 kg)  01/01/22 197 lb (89.4 kg)   BMI Readings from Last 3 Encounters:  03/17/22 27.26 kg/m   03/12/22 26.83 kg/m  01/01/22 28.27 kg/m    Assessment/Interventions: Review of patient past medical history, allergies, medications, health status, including review of consultants reports, laboratory and other test data, was performed as part of comprehensive evaluation and provision of chronic care management services.   SDOH:  (Social Determinants of Health) assessments and interventions performed: No  SDOH Screenings   Alcohol Screen: Not on file  Depression (PHQ2-9): Low Risk  (03/12/2022)   Depression (PHQ2-9)    PHQ-2 Score: 0  Financial Resource Strain: Low Risk  (03/12/2022)   Overall Financial Resource Strain (CARDIA)    Difficulty of Paying Living Expenses: Not hard at all  Food Insecurity: No Food Insecurity (03/12/2022)   Hunger Vital Sign    Worried About Running Out of Food in the Last Year: Never true    Ran Out of Food in the Last Year: Never true  Housing: Not on file  Physical Activity: Inactive (03/12/2022)   Exercise Vital Sign    Days of Exercise per Week: 0 days    Minutes of Exercise per Session: 0 min  Social Connections: Not on file  Stress: No Stress Concern Present (03/12/2022)   [place]    Feeling of Stress : Not at all  Tobacco Use: Low Risk  (03/17/2022)   Patient History    Smoking Tobacco Use: Never    Smokeless Tobacco Use: Never    Passive Exposure: Not on file  Transportation Needs: No Transportation Needs (03/12/2022)   PRAPARE - Transportation    Lack of Transportation (Medical): No    Lack of Transportation (Non-Medical): No    CCM Care Plan  No Known Allergies  Medications Reviewed Today     Reviewed by Glendale Chard, MD (Physician) on 03/17/22 at 1248  Med List Status: <None>   Medication Order Taking? Sig Documenting Provider Last Dose Status Informant  ACCU-CHEK GUIDE test strip [id number] Yes Use as instructed to check blood sugars 1 time per day Minette Brine, [name]  Taking Active   albuterol (VENTOLIN HFA) 108 (90 Base) MCG/ACT inhaler [id number] Yes Inhale 2 puffs into the lungs every 6 (six) hours as needed for wheezing or shortness of breath. Glendale Chard, MD Taking Active   amLODipine (NORVASC) 5 MG tablet [id number] Yes Take 1 tablet (5 mg total) by mouth daily. Glendale Chard, MD Taking Active Self  atorvastatin (LIPITOR) 40  MG tablet [id number] Yes Take 40 mg by mouth daily. [provider] Taking Active Self  benzonatate (TESSALON PERLES) 100 MG capsule [id number] No Take 1 capsule (100 mg total) by mouth 3 (three) times daily as needed for cough.  Patient not taking: Reported on 03/12/2022   Glendale Chard, MD Not Taking Active   Blood Glucose Monitoring Suppl (ONE TOUCH ULTRA 2) w/Device KIT [id number] Yes Use as directed to check blood sugars 1 time per day dx: e11.65 Glendale Chard, MD Taking Active Self  cefTRIAXone (ROCEPHIN) injection 1 g [id number]   Minette Brine, [name]  Active   dapagliflozin propanediol (FARXIGA) 10 MG TABS tablet [id number] Yes Take 1 tablet (10 mg total) by mouth daily. Glendale Chard, MD Taking Active Self  HYDROcodone bit-homatropine (HYDROMET) 5-1.5 MG/5ML syrup [id number] No Take 5 mLs by mouth every 6 (six) hours as needed for cough.  Patient not taking: Reported on 03/12/2022   Minette Brine, [name] Not Taking Active   sildenafil (REVATIO) 20 MG tablet [id number] Yes Take 1 tablet (20 mg total) by mouth as needed. MAX 3 TO 5 TABLETS PER DAY Glendale Chard, MD Taking Active Self  sitaGLIPtin (JANUVIA) 100 MG tablet [id number] No Take 1 tablet (100 mg total) by mouth daily.  Patient not taking: Reported on 03/12/2022   Glendale Chard, MD Not Taking Active Self  tiZANidine (ZANAFLEX) 4 MG tablet [id number] Yes Take 1 tablet (4 mg total) by mouth daily. Glendale Chard, MD  Active   triamcinolone acetonide [facility]) injection 40 mg [id number]   Minette Brine, [name]  Active             Patient Active Problem List   Diagnosis  Date Noted   Sepsis ([place]) 12/24/2021   Primary hypertension 12/24/2021   RLL pneumonia 12/24/2021   AKI (acute kidney injury) ([place]) 12/24/2021   Hyperlipidemia 12/24/2021   Class 1 obesity due to excess calories with serious comorbidity and body mass index (BMI) of 30.0 to 30.9 in adult 06/04/2021   Pruritus 10/13/2018   Type 2 diabetes mellitus with stage 2 chronic kidney disease, without long-term current use of insulin ([place]) 07/06/2018   Hypertensive nephropathy 07/06/2018   Pure hypercholesterolemia 07/06/2018   Uncontrolled REM sleep behavior disorder 10/14/2017   Snoring 10/14/2017   Nocturia more than twice per night 10/14/2017   CKD (chronic kidney disease) stage 2, GFR 60-89 ml/min 10/14/2017   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    Immunization History  Administered Date(s) Administered   PFIZER(Purple Top)SARS-COV-2 Vaccination 12/01/2019, 12/26/2019, 06/29/2020   Tdap 07/19/2013   Zoster Recombinat (Shingrix) 03/17/2022    Conditions to be addressed/monitored:  Hypertension and Diabetes  Care Plan : [place]  Updates made by Mayford Knife, [place] since 03/18/2022 12:00 AM     Problem: HLD, DM II   Priority: High     Long-Range Goal: Disease Management   Recent Progress: On track  Priority: High  Note:   Current Barriers:  Unable to independently monitor therapeutic efficacy Unable to achieve control of Diabetes   Pharmacist Clinical Goal(s):  Patient will achieve adherence to monitoring guidelines and medication adherence to achieve therapeutic efficacy through collaboration with PharmD and provider.   Interventions: 1:1 collaboration with Glendale Chard, MD regarding development and update of comprehensive plan of care as evidenced by provider attestation and co-signature Inter-disciplinary care team collaboration (see longitudinal plan of care) Comprehensive medication review performed; medication list updated in electronic medical  record  Hypertension (BP goal <130/80) -Controlled -  Current treatment: Amlodipine 5 mg tablet once per day  Appropriate, Effective, Safe, Accessible -Current home readings: BP readings are good 03/14/2022 -119/80 -Current dietary habits: he is not seasoning his food with saly  -Denies hypotensive/hypertensive symptoms -Educated on Importance of home blood pressure monitoring; Proper BP monitoring technique; -Counseled to monitor BP at home at least 4 times per week, document, and provide log at future appointments -Recommended to continue current medication  Diabetes (A1c goal <7%) -Uncontrolled -Current medications: Farxiga 10 mg tablet once per day Appropriate, Effective, Safe, Accessible -Medications previously tried: Januvia   -Current home glucose readings fasting glucose: 185 range not over 200  -Denies hypoglycemic/hyperglycemic symptoms -Current meal patterns: he is still eating mostly vegetables but he does have sausage or bacon  drinks: he is drinking a lot more water, he was drinking 2 bottles per day to 4 or 5 bottles per day  -Current exercise: will discuss during next visit after patient has consultation with pulmonologist  -Educated on A1c and blood sugar goals; Complications of diabetes including kidney damage, retinal damage, and cardiovascular disease; -Encourage patient to have the appointment with pulmonologist  -Counseled to check feet daily and get yearly eye exams -Recommended to continue current medication -Collaborate with PCP to send in a prescription for Metformin 500 mg tablet once per day a  Patient Goals/Self-Care Activities Patient will:  - take medications as prescribed as evidenced by patient report and record review  Follow Up Plan: The patient has been provided with contact information for the care management team and has been advised to call with any health related questions or concerns.       Medication Assistance:  Wilder Glade obtained through  07/2022 medication assistance program.  Enrollment ends 07/2022   Januvia pending patient assistance program   Compliance/Adherence/Medication fill history: Care Gaps: -Shingrix Vaccine -COVID-19 Vaccine  -Fecal DNA Cologuard -Influenza Vaccine  Star-Rating Drugs: Atorvastatin 40 mg tablet  Farxiga 10 mg tablet   Patient's preferred pharmacy is:  [facility], [place] [address] [place] [address] Phone: [phone] Fax: [place], [place] - [address] AT [address] [place] [address] Phone: [phone] Fax: [phone]  Uses pill box? No- patient currently uses vials for his medications Pt endorses 90% compliance  We discussed: Benefits of medication synchronization, packaging and delivery as well as enhanced pharmacist oversight with [employer]. Patient decided to: Continue current medication management strategy  Care Plan and Follow Up Patient Decision:  Patient agrees to Care Plan and Follow-up.  Plan: The patient has been provided with contact information for the care management team and has been advised to call with any health related questions or concerns.   [name], CPP, PharmD Clinical Pharmacist Practitioner [facility] [phone]

## 2022-03-17 ENCOUNTER — Ambulatory Visit (INDEPENDENT_AMBULATORY_CARE_PROVIDER_SITE_OTHER): Payer: Medicare HMO | Admitting: Internal Medicine

## 2022-03-17 ENCOUNTER — Encounter: Payer: Self-pay | Admitting: Internal Medicine

## 2022-03-17 VITALS — BP 118/80 | HR 68 | Temp 98.1°F | Ht 70.0 in | Wt 190.0 lb

## 2022-03-17 DIAGNOSIS — Z23 Encounter for immunization: Secondary | ICD-10-CM

## 2022-03-17 DIAGNOSIS — M545 Low back pain, unspecified: Secondary | ICD-10-CM | POA: Diagnosis not present

## 2022-03-17 DIAGNOSIS — E1122 Type 2 diabetes mellitus with diabetic chronic kidney disease: Secondary | ICD-10-CM

## 2022-03-17 DIAGNOSIS — N182 Chronic kidney disease, stage 2 (mild): Secondary | ICD-10-CM

## 2022-03-17 DIAGNOSIS — Z1211 Encounter for screening for malignant neoplasm of colon: Secondary | ICD-10-CM

## 2022-03-17 MED ORDER — TIZANIDINE HCL 4 MG PO TABS: 4.0000 mg | ORAL_TABLET | Freq: Every day | ORAL | 1 refills | Status: DC

## 2022-03-17 NOTE — Patient Instructions (Signed)

## 2022-03-17 NOTE — Progress Notes (Signed)
I,Victoria T Hamilton,acting as a scribe for Robyn N Sanders, MD.,have documented all relevant documentation on the behalf of Robyn N Sanders, MD,as directed by  Robyn N Sanders, MD while in the presence of Robyn N Sanders, MD.    Subjective:     Patient ID: Warren [name] , male    DOB: 09/13/1945 , 75 y.o.   MRN: [id number]   Chief Complaint  Patient presents with   Back Pain    HPI  Pt presents today with back pain starting Friday. States he awakened Friday morning. He has taken Tylenol arhtritis, BC, Goody,Dones - no relief. He does not know if he lifted his mother the wrong way, he is her primary caregiver. He has been taking Tylenol and Aleve for pain. There is pain with movement, he has difficulty standing from a seated position. Pain scale 10/10.   Back Pain This is a new problem. The current episode started 1 to 4 weeks ago. The problem occurs constantly. The problem is unchanged. The pain is present in the lumbar spine. The quality of the pain is described as aching and shooting. The pain is at a severity of 10/10. The pain is severe. The symptoms are aggravated by bending, standing and position. Stiffness is present All day. Pertinent negatives include no bladder incontinence, bowel incontinence or paresthesias. The treatment provided moderate relief.     Past Medical History:  Diagnosis Date   Chronic kidney disease    stage 2   Diabetes mellitus without complication (HCC)    Hypertension    Malaise and fatigue      Family History  Problem Relation Age of Onset   Hypertension Mother    Hypothyroidism Mother    Alzheimer's disease Father    Prostate cancer Father    Prostate cancer Brother      Current Outpatient Medications:    ACCU-CHEK GUIDE test strip, Use as instructed to check blood sugars 1 time per day, Disp: 50 strip, Rfl: 11   albuterol (VENTOLIN HFA) 108 (90 Base) MCG/ACT inhaler, Inhale 2 puffs into the lungs every 6 (six) hours as needed for wheezing  or shortness of breath., Disp: 8 g, Rfl: 2   amLODipine (NORVASC) 5 MG tablet, Take 1 tablet (5 mg total) by mouth daily., Disp: 90 tablet, Rfl: 1   atorvastatin (LIPITOR) 40 MG tablet, Take 40 mg by mouth daily., Disp: , Rfl:    Blood Glucose Monitoring Suppl (ONE TOUCH ULTRA 2) w/Device KIT, Use as directed to check blood sugars 1 time per day dx: e11.65, Disp: 1 kit, Rfl: 1   dapagliflozin propanediol (FARXIGA) 10 MG TABS tablet, Take 1 tablet (10 mg total) by mouth daily., Disp: 90 tablet, Rfl: 3   sildenafil (REVATIO) 20 MG tablet, Take 1 tablet (20 mg total) by mouth as needed. MAX 3 TO 5 TABLETS PER DAY, Disp: 60 tablet, Rfl: 1   tiZANidine (ZANAFLEX) 4 MG tablet, Take 1 tablet (4 mg total) by mouth daily., Disp: 30 tablet, Rfl: 1   benzonatate (TESSALON PERLES) 100 MG capsule, Take 1 capsule (100 mg total) by mouth 3 (three) times daily as needed for cough. (Patient not taking: Reported on 03/12/2022), Disp: 30 capsule, Rfl: 1   HYDROcodone bit-homatropine (HYDROMET) 5-1.5 MG/5ML syrup, Take 5 mLs by mouth every 6 (six) hours as needed for cough. (Patient not taking: Reported on 03/12/2022), Disp: 120 mL, Rfl: 0   sitaGLIPtin (JANUVIA) 100 MG tablet, Take 1 tablet (100 mg total) by mouth daily. (  Patient not taking: Reported on 03/12/2022), Disp: 90 tablet, Rfl: 1  Current Facility-Administered Medications:    cefTRIAXone (ROCEPHIN) injection 1 g, 1 g, Intramuscular, Once, Minette Brine, [name]   triamcinolone acetonide (KENALOG-40) injection 40 mg, 40 mg, Intramuscular, Once, Minette Brine, [name]   No Known Allergies   Review of Systems  Constitutional: Negative.   HENT: Negative.    Respiratory: Negative.    Gastrointestinal:  Negative for bowel incontinence.  Genitourinary:  Negative for bladder incontinence.  Musculoskeletal:  Positive for back pain.  Skin: Negative.   Neurological:  Negative for paresthesias.  Hematological: Negative.      Today's Vitals   03/17/22 1218  BP: 118/80   Pulse: 68  Temp: 98.1 F (36.7 C)  Weight: 190 lb (86.2 kg)  Height: 5' 10" (1.778 m)  PainSc: 10-Worst pain ever   Body mass index is 27.26 kg/m.  Wt Readings from Last 3 Encounters:  03/17/22 190 lb (86.2 kg)  03/12/22 187 lb (84.8 kg)  01/01/22 197 lb (89.4 kg)    Objective:  Physical Exam Vitals and nursing note reviewed.  Constitutional:      Appearance: Normal appearance.  HENT:     Head: Normocephalic and atraumatic.  Eyes:     Extraocular Movements: Extraocular movements intact.  Cardiovascular:     Rate and Rhythm: Normal rate and regular rhythm.     Heart sounds: Normal heart sounds.  Pulmonary:     Effort: Pulmonary effort is normal.     Breath sounds: Normal breath sounds.  Musculoskeletal:        General: Tenderness present.     Cervical back: Normal range of motion.  Skin:    General: Skin is warm.  Neurological:     General: No focal deficit present.     Mental Status: He is alert.  Psychiatric:        Mood and Affect: Mood normal.      Assessment And Plan:     1. Acute bilateral low back pain without sciatica Comments: Sx are likely due to musculoskeletal strain. I will send rx tizanidine to use nightly prn.   2. Type 2 diabetes mellitus with stage 2 chronic kidney disease, without long-term current use of insulin (HCC) Comments: Chronic, I will check labs as below. I will adjust meds as needed. He was given samples of Januvia 162m daily, will send for PA as well. F/u 3-4 months.  - CMP14+EGFR - Hemoglobin A1c  3. Screen for colon cancer Comments: I will refer him to GI for CRC screening as requested. He has declined this in the past.  - Ambulatory referral to Gastroenterology  4. Immunization due Comments: He was given Shingrix IM x 1, billed via [facility].  - Zoster Recombinant (Shingrix )   Patient was given opportunity to ask questions. Patient verbalized understanding of the plan and was able to repeat key elements of the plan. All  questions were answered to their satisfaction.   I, RMaximino Greenland MD, have reviewed all documentation for this visit. The documentation on 03/24/22 for the exam, diagnosis, procedures, and orders are all accurate and complete.   IF YOU HAVE BEEN REFERRED TO A SPECIALIST, IT MAY TAKE 1-2 WEEKS TO SCHEDULE/PROCESS THE REFERRAL. IF YOU HAVE NOT HEARD FROM US/SPECIALIST IN TWO WEEKS, PLEASE GIVE [place] CALL AT [phone] X [phone].   THE PATIENT IS ENCOURAGED TO PRACTICE SOCIAL DISTANCING DUE TO THE COVID-19 PANDEMIC.

## 2022-03-18 LAB — CMP14+EGFR
ALT: 18 IU/L (ref 0–44)
AST: 16 IU/L (ref 0–40)
Albumin/Globulin Ratio: 1.6 (ref 1.2–2.2)
Albumin: 4.5 g/dL (ref 3.8–4.8)
Alkaline Phosphatase: 66 IU/L (ref 44–121)
BUN/Creatinine Ratio: 14 (ref 10–24)
BUN: 23 mg/dL (ref 8–27)
Bilirubin Total: 0.5 mg/dL (ref 0.0–1.2)
CO2: 20 mmol/L (ref 20–29)
Calcium: 10.3 mg/dL — ABNORMAL HIGH (ref 8.6–10.2)
Chloride: 107 mmol/L — ABNORMAL HIGH (ref 96–106)
Creatinine, Ser: 1.63 mg/dL — ABNORMAL HIGH (ref 0.76–1.27)
Globulin, Total: 2.9 g/dL (ref 1.5–4.5)
Glucose: 171 mg/dL — ABNORMAL HIGH (ref 70–99)
Potassium: 5.1 mmol/L (ref 3.5–5.2)
Sodium: 142 mmol/L (ref 134–144)
Total Protein: 7.4 g/dL (ref 6.0–8.5)
eGFR: 44 mL/min/{1.73_m2} — ABNORMAL LOW (ref >59)

## 2022-03-18 LAB — HEMOGLOBIN A1C
Est. average glucose Bld gHb Est-mCnc: 232 mg/dL
Hgb A1c MFr Bld: 9.7 % — ABNORMAL HIGH (ref 4.8–5.6)

## 2022-03-18 NOTE — Patient Instructions (Addendum)
Visit Information It was great speaking with you today!  Please let me know if you have any questions about our visit.   Goals Addressed             This Visit's Progress    Manage My Medicine       Timeframe:  Long-Range Goal Priority:  High             Expected End Date:                       Follow Up Date 07/15/2022  In progress: - call for medicine refill 2 or 3 days before it runs out - call if I am sick and can't take my medicine - keep a list of all the medicines I take; vitamins and herbals too    Why is this important?   These steps will help you keep on track with your medicines.  Please call if you have any other questions         Patient Care Plan: CCM Pharmacy Care Plan     Problem Identified: HLD, DM II   Priority: High     Long-Range Goal: Disease Management   Recent Progress: On track  Priority: High  Note:   Current Barriers:  Unable to independently monitor therapeutic efficacy Unable to achieve control of Diabetes   Pharmacist Clinical Goal(s):  Patient will achieve adherence to monitoring guidelines and medication adherence to achieve therapeutic efficacy through collaboration with PharmD and provider.   Interventions: 1:1 collaboration with Dorothyann Peng, MD regarding development and update of comprehensive plan of care as evidenced by provider attestation and co-signature Inter-disciplinary care team collaboration (see longitudinal plan of care) Comprehensive medication review performed; medication list updated in electronic medical record  Hypertension (BP goal <130/80) -Controlled -Current treatment: Amlodipine 5 mg tablet once per day  Appropriate, Effective, Safe, Accessible -Current home readings: BP readings are good 03/14/2022 -119/80 -Current dietary habits: he is not seasoning his food with saly  -Denies hypotensive/hypertensive symptoms -Educated on Importance of home blood pressure monitoring; Proper BP monitoring  technique; -Counseled to monitor BP at home at least 4 times per week, document, and provide log at future appointments -Recommended to continue current medication  Diabetes (A1c goal <7%) -Uncontrolled -Current medications: Farxiga 10 mg tablet once per day Appropriate, Effective, Safe, Accessible -Medications previously tried: Januvia   -Current home glucose readings fasting glucose: 185 range not over 200  -Denies hypoglycemic/hyperglycemic symptoms -Current meal patterns: he is still eating mostly vegetables but he does have sausage or bacon  drinks: he is drinking a lot more water, he was drinking 2 bottles per day to 4 or 5 bottles per day  -Current exercise: will discuss during next visit after patient has consultation with pulmonologist  -Educated on A1c and blood sugar goals; Complications of diabetes including kidney damage, retinal damage, and cardiovascular disease; -Encourage patient to have the appointment with pulmonologist  -Counseled to check feet daily and get yearly eye exams -Recommended to continue current medication -Collaborate with PCP to send in a prescription for Metformin 500 mg tablet once per day a  Patient Goals/Self-Care Activities Patient will:  - take medications as prescribed as evidenced by patient report and record review  Follow Up Plan: The patient has been provided with contact information for the care management team and has been advised to call with any health related questions or concerns.       Patient agreed to services  and verbal consent obtained.   The patient verbalized understanding of instructions, educational materials, and care plan provided today and agreed to receive a mailed copy of patient instructions, educational materials, and care plan.   [name], PharmD Clinical Pharmacist [facility] [phone]

## 2022-03-25 ENCOUNTER — Telehealth: Payer: Self-pay

## 2022-03-25 NOTE — Telephone Encounter (Signed)
Called Mr. Stabenow to confirm that attestation form is in the front for him to sign to confirm that he is not filling his medication with prescription benefits. He confirmed that he is going to come by to sign.   [name], CPP, PharmD Clinical Pharmacist Practitioner [facility] [phone]

## 2022-04-01 NOTE — Progress Notes (Deleted)
Synopsis: Referred in 03/2022 for a CT chest showing RML pneumonia; symptoms started in 12/2021  Subjective:   PATIENT ID: Warren [name] GENDER: male DOB: [birth year], MRN: [id number]   HPI  No chief complaint on file.   ***  Record review: May 2023 PCP notes reviewed from where the patient had been seen for pneumonia in 12/2021 in the ER, still had cough and dyspnea.   Past Medical History:  Diagnosis Date   Chronic kidney disease    stage 2   Diabetes mellitus without complication (HCC)    Hypertension    Malaise and fatigue      Family History  Problem Relation Age of Onset   Hypertension Mother    Hypothyroidism Mother    Alzheimer's disease Father    Prostate cancer Father    Prostate cancer Brother      Social History   Socioeconomic History   Marital status: Single    Spouse name: Not on file   Number of children: Not on file   Years of education: Not on file   Highest education level: Not on file  Occupational History   Occupation: retired  Tobacco Use   Smoking status: Never   Smokeless tobacco: Never  Vaping Use   Vaping Use: Never used  Substance and Sexual Activity   Alcohol use: Yes    Alcohol/week: 28.0 standard drinks of alcohol    Types: 21 Cans of beer, 7 Shots of liquor per week    Comment: daily   Drug use: Yes    Types: Marijuana   Sexual activity: Yes  Other Topics Concern   Not on file  Social History Narrative   ** Merged History Encounter **       Social Determinants of Health   Financial Resource Strain: Low Risk  (03/12/2022)   Overall Financial Resource Strain (CARDIA)    Difficulty of Paying Living Expenses: Not hard at all  Food Insecurity: No Food Insecurity (03/12/2022)   Hunger Vital Sign    Worried About Running Out of Food in the Last Year: Never true    Ran Out of Food in the Last Year: Never true  Transportation Needs: No Transportation Needs (03/12/2022)   PRAPARE - Hydrologist  (Medical): No    Lack of Transportation (Non-Medical): No  Physical Activity: Inactive (03/12/2022)   Exercise Vital Sign    Days of Exercise per Week: 0 days    Minutes of Exercise per Session: 0 min  Stress: No Stress Concern Present (03/12/2022)   [place]    Feeling of Stress : Not at all  Social Connections: Not on file  Intimate Partner Violence: Not on file     No Known Allergies   Outpatient Medications Prior to Visit  Medication Sig Dispense Refill   ACCU-CHEK GUIDE test strip Use as instructed to check blood sugars 1 time per day 50 strip 11   albuterol (VENTOLIN HFA) 108 (90 Base) MCG/ACT inhaler Inhale 2 puffs into the lungs every 6 (six) hours as needed for wheezing or shortness of breath. 8 g 2   amLODipine (NORVASC) 5 MG tablet Take 1 tablet (5 mg total) by mouth daily. 90 tablet 1   atorvastatin (LIPITOR) 40 MG tablet Take 40 mg by mouth daily.     benzonatate (TESSALON PERLES) 100 MG capsule Take 1 capsule (100 mg total) by mouth 3 (three) times daily as needed for  cough. (Patient not taking: Reported on 03/12/2022) 30 capsule 1   Blood Glucose Monitoring Suppl (ONE TOUCH ULTRA 2) w/Device KIT Use as directed to check blood sugars 1 time per day dx: e11.65 1 kit 1   dapagliflozin propanediol (FARXIGA) 10 MG TABS tablet Take 1 tablet (10 mg total) by mouth daily. 90 tablet 3   HYDROcodone bit-homatropine (HYDROMET) 5-1.5 MG/5ML syrup Take 5 mLs by mouth every 6 (six) hours as needed for cough. (Patient not taking: Reported on 03/12/2022) 120 mL 0   sildenafil (REVATIO) 20 MG tablet Take 1 tablet (20 mg total) by mouth as needed. MAX 3 TO 5 TABLETS PER DAY 60 tablet 1   sitaGLIPtin (JANUVIA) 100 MG tablet Take 1 tablet (100 mg total) by mouth daily. (Patient not taking: Reported on 03/12/2022) 90 tablet 1   tiZANidine (ZANAFLEX) 4 MG tablet Take 1 tablet (4 mg total) by mouth daily. 30 tablet 1    Facility-Administered Medications Prior to Visit  Medication Dose Route Frequency Provider Last Rate Last Admin   cefTRIAXone (ROCEPHIN) injection 1 g  1 g Intramuscular Once Minette Brine, [name]       triamcinolone acetonide (KENALOG-40) injection 40 mg  40 mg Intramuscular Once Minette Brine, [name]        ROS    Objective:  Physical Exam   There were no vitals filed for this visit.  ***  CBC    Component Value Date/Time   WBC 13.0 (H) 01/01/2022 1149   WBC 18.7 (H) 12/26/2021 0239   RBC 4.38 01/01/2022 1149   RBC 3.84 (L) 12/26/2021 0239   HGB 13.0 01/01/2022 1149   HCT 38.0 01/01/2022 1149   PLT 402 01/01/2022 1149   MCV 87 01/01/2022 1149   MCH 29.7 01/01/2022 1149   MCH 30.5 12/26/2021 0239   MCHC 34.2 01/01/2022 1149   MCHC 35.0 12/26/2021 0239   RDW 13.1 01/01/2022 1149   LYMPHSABS 2.5 12/26/2021 0239   LYMPHSABS 4.4 (H) 10/13/2018 1247   MONOABS 2.0 (H) 12/26/2021 0239   EOSABS 0.2 12/26/2021 0239   EOSABS 0.1 10/13/2018 1247   BASOSABS 0.1 12/26/2021 0239   BASOSABS 0.1 10/13/2018 1247     Chest imaging: 01/2022 CT chest > consolidation with air bronchograms in RML, personally reviewed  PFT:  Labs:  Path:  Echo:  Heart Catheterization:       Assessment & Plan:   No diagnosis found.  Discussion: ***    Current Outpatient Medications:    ACCU-CHEK GUIDE test strip, Use as instructed to check blood sugars 1 time per day, Disp: 50 strip, Rfl: 11   albuterol (VENTOLIN HFA) 108 (90 Base) MCG/ACT inhaler, Inhale 2 puffs into the lungs every 6 (six) hours as needed for wheezing or shortness of breath., Disp: 8 g, Rfl: 2   amLODipine (NORVASC) 5 MG tablet, Take 1 tablet (5 mg total) by mouth daily., Disp: 90 tablet, Rfl: 1   atorvastatin (LIPITOR) 40 MG tablet, Take 40 mg by mouth daily., Disp: , Rfl:    benzonatate (TESSALON PERLES) 100 MG capsule, Take 1 capsule (100 mg total) by mouth 3 (three) times daily as needed for cough. (Patient not  taking: Reported on 03/12/2022), Disp: 30 capsule, Rfl: 1   Blood Glucose Monitoring Suppl (ONE TOUCH ULTRA 2) w/Device KIT, Use as directed to check blood sugars 1 time per day dx: e11.65, Disp: 1 kit, Rfl: 1   dapagliflozin propanediol (FARXIGA) 10 MG TABS tablet, Take 1 tablet (10 mg total)  by mouth daily., Disp: 90 tablet, Rfl: 3   HYDROcodone bit-homatropine (HYDROMET) 5-1.5 MG/5ML syrup, Take 5 mLs by mouth every 6 (six) hours as needed for cough. (Patient not taking: Reported on 03/12/2022), Disp: 120 mL, Rfl: 0   sildenafil (REVATIO) 20 MG tablet, Take 1 tablet (20 mg total) by mouth as needed. MAX 3 TO 5 TABLETS PER DAY, Disp: 60 tablet, Rfl: 1   sitaGLIPtin (JANUVIA) 100 MG tablet, Take 1 tablet (100 mg total) by mouth daily. (Patient not taking: Reported on 03/12/2022), Disp: 90 tablet, Rfl: 1   tiZANidine (ZANAFLEX) 4 MG tablet, Take 1 tablet (4 mg total) by mouth daily., Disp: 30 tablet, Rfl: 1  Current Facility-Administered Medications:    cefTRIAXone (ROCEPHIN) injection 1 g, 1 g, Intramuscular, Once, Minette Brine, [name]   triamcinolone acetonide (KENALOG-40) injection 40 mg, 40 mg, Intramuscular, Once, Minette Brine, [name]

## 2022-04-02 ENCOUNTER — Institutional Professional Consult (permissible substitution): Payer: Medicare HMO | Admitting: Pulmonary Disease

## 2022-04-03 ENCOUNTER — Telehealth: Payer: Self-pay

## 2022-04-03 NOTE — Telephone Encounter (Signed)
Chmg-error.  

## 2022-04-08 ENCOUNTER — Ambulatory Visit: Payer: Self-pay | Admitting: Nurse Practitioner

## 2022-04-10 DIAGNOSIS — I10 Essential (primary) hypertension: Secondary | ICD-10-CM

## 2022-04-10 DIAGNOSIS — E1159 Type 2 diabetes mellitus with other circulatory complications: Secondary | ICD-10-CM | POA: Diagnosis not present

## 2022-04-10 DIAGNOSIS — Z7984 Long term (current) use of oral hypoglycemic drugs: Secondary | ICD-10-CM | POA: Diagnosis not present

## 2022-04-15 ENCOUNTER — Institutional Professional Consult (permissible substitution): Payer: Medicare HMO | Admitting: Pulmonary Disease

## 2022-04-15 ENCOUNTER — Telehealth: Payer: Self-pay

## 2022-04-15 NOTE — Chronic Care Management (AMB) (Signed)
Patient Assistance Coordination  Refill request sent to clinical team for a 90 day supply of Farxiga 10 mg with 3 refills to [facility], patient receives through [employer] patient assistance program.   Farxiga 10 mg was last shipped on 03/25/2022.  Billee Cashing, CMA Clinical Pharmacist Assistant [phone]

## 2022-04-17 ENCOUNTER — Other Ambulatory Visit: Payer: Self-pay | Admitting: Internal Medicine

## 2022-04-17 MED ORDER — DAPAGLIFLOZIN PROPANEDIOL 10 MG PO TABS: 10.0000 mg | ORAL_TABLET | Freq: Every day | ORAL | 3 refills | Status: AC

## 2022-04-30 ENCOUNTER — Telehealth: Payer: Self-pay

## 2022-04-30 NOTE — Chronic Care Management (AMB) (Addendum)
Care Gap(s) Not Met that Need to be Addressed:   Colorectal Cancer Screening   Action Taken: Reviewed chart. Orders placed for cologuard. Contacted patient to follow up and was informed that patient was told not to complete since he was scheduled for colonoscopy consult a few weeks ago. Patient had to cancel appointment due to a family emergency and will call to reschedule.   Follow Up: Follow up with patient in 2 weeks.  Care Gap(s) Not Met that Need to be Addressed:  Kidney Health Evaluation for patients with Diabetes    Action Taken:  eGFR= 44 03-17-2022. Last Uacr= 10 07-10-2021. Sent message to team to order at next visit on 07-23-2022.    Follow Up: follow up 07-25-2022    [place] Pharmacist Assistant [phone]

## 2022-05-19 ENCOUNTER — Other Ambulatory Visit: Payer: Self-pay | Admitting: Internal Medicine

## 2022-05-23 DIAGNOSIS — Z1211 Encounter for screening for malignant neoplasm of colon: Secondary | ICD-10-CM | POA: Diagnosis not present

## 2022-05-28 ENCOUNTER — Telehealth: Payer: Self-pay

## 2022-05-28 NOTE — Chronic Care Management (AMB) (Addendum)
AZ&ME Notification:  Warren [name] 10 mg  was shipped on 05/20/2022, allow 1-2 business days for shipment to arrive. Shipment was sent via [employer], Tracking number [id number].   Patient approved for 2024 Enrollment with [employer] Patient Assistance Program for Farxiga 10 mg, enrollment will end on August 11, 2023.    Pattricia Boss, [place] Pharmacist Assistant [phone]

## 2022-06-01 LAB — COLOGUARD: COLOGUARD: NEGATIVE

## 2022-06-12 ENCOUNTER — Institutional Professional Consult (permissible substitution): Payer: Medicare HMO | Admitting: Pulmonary Disease

## 2022-06-24 ENCOUNTER — Other Ambulatory Visit: Payer: Self-pay | Admitting: Internal Medicine

## 2022-07-11 ENCOUNTER — Telehealth: Payer: Self-pay

## 2022-07-11 NOTE — Chronic Care Management (AMB) (Signed)
  [name] was reminded to have all medications, supplements and any blood glucose and blood pressure readings available for review with [name], Pharm. D, at his telephone visit on 07-15-2022 at 11:00.   Questions: Have you had any recent office visit or specialist visit outside of [facility] systems? Patient stated no  Are there any concerns you would like to discuss during your office visit? Patient stated no  Are you having any problems obtaining your medications? (Whether it pharmacy issues or cost) Patient stated no  If patient has any PAP medications ask if they are having any problems getting their PAP medication or refill? Patient stated he has supply of farxiga but never received [place]. Patient stated he feels he is ok without the [place].  Care Gaps: PNA Vac overdue Yearly ophthalmology overdue Flu vaccine overdue 2nd shingrix overdue Covid booster overdue UACR overdue Yearly foot exam overdue  Star Rating Drug: Atorvastatin 80 mg- Last filled 04-21-2022 90 DS Farxiga 10 mg- PAP   Any gaps in medications fill history? No  Warren [name] [facility] Clinical Pharmacist Assistant [phone]

## 2022-07-15 ENCOUNTER — Telehealth: Payer: Medicare HMO

## 2022-07-23 ENCOUNTER — Encounter: Payer: Self-pay | Admitting: Nurse Practitioner

## 2022-07-23 ENCOUNTER — Ambulatory Visit (INDEPENDENT_AMBULATORY_CARE_PROVIDER_SITE_OTHER): Payer: Medicare HMO | Admitting: Nurse Practitioner

## 2022-07-23 VITALS — BP 134/72 | HR 60 | Temp 98.5°F | Ht 70.0 in | Wt 198.0 lb

## 2022-07-23 DIAGNOSIS — R39198 Other difficulties with micturition: Secondary | ICD-10-CM | POA: Diagnosis not present

## 2022-07-23 DIAGNOSIS — I129 Hypertensive chronic kidney disease with stage 1 through stage 4 chronic kidney disease, or unspecified chronic kidney disease: Secondary | ICD-10-CM

## 2022-07-23 DIAGNOSIS — N182 Chronic kidney disease, stage 2 (mild): Secondary | ICD-10-CM | POA: Diagnosis not present

## 2022-07-23 DIAGNOSIS — R351 Nocturia: Secondary | ICD-10-CM | POA: Diagnosis not present

## 2022-07-23 DIAGNOSIS — Z23 Encounter for immunization: Secondary | ICD-10-CM | POA: Diagnosis not present

## 2022-07-23 DIAGNOSIS — E1122 Type 2 diabetes mellitus with diabetic chronic kidney disease: Secondary | ICD-10-CM | POA: Diagnosis not present

## 2022-07-23 NOTE — Progress Notes (Signed)
I,Tianna Badgett,acting as a Education administrator for [employer], [name].,have documented all relevant documentation on the behalf of Minette Brine, [name],as directed by  Minette Brine, [name] while in the presence of Minette Brine, Honor.  Subjective:     Patient ID: Warren [name] , male    DOB: October 25, 1945 , 76 y.o.   MRN: [id number]   Chief Complaint  Patient presents with   Diabetes    HPI  He does [place] with patient assistance. He was not able to get [place] covered and he has not been taking it for "a while". He did not call to let us know. He was not able to tolerate janumet. Does not want to do injections.  He has noticed his urinary stream has decreased in the last few months, he also feels he does not empty completely  Diabetes He presents for his follow-up diabetic visit. He has type 2 diabetes mellitus. There are no hypoglycemic associated symptoms. There are no diabetic associated symptoms. Pertinent negatives for diabetes include no polydipsia, no polyphagia and no polyuria. There are no hypoglycemic complications. There are no diabetic complications. Current diabetic treatment includes oral agent (dual therapy) and oral agent (monotherapy).     Past Medical History:  Diagnosis Date   Chronic kidney disease    stage 2   Diabetes mellitus without complication (HCC)    Hypertension    Malaise and fatigue      Family History  Problem Relation Age of Onset   Hypertension Mother    Hypothyroidism Mother    Alzheimer's disease Father    Prostate cancer Father    Prostate cancer Brother      Current Outpatient Medications:    ACCU-CHEK GUIDE test strip, Use as instructed to check blood sugars 1 time per day, Disp: 50 strip, Rfl: 11   albuterol (VENTOLIN HFA) 108 (90 Base) MCG/ACT inhaler, Inhale 2 puffs into the lungs every 6 (six) hours as needed for wheezing or shortness of breath., Disp: 8 g, Rfl: 2   amLODipine (NORVASC) 5 MG tablet, Take 1 tablet (5 mg total) by mouth daily., Disp: 90  tablet, Rfl: 1   atorvastatin (LIPITOR) 40 MG tablet, Take 40 mg by mouth daily., Disp: , Rfl:    Blood Glucose Monitoring Suppl (ONE TOUCH ULTRA 2) w/Device KIT, Use as directed to check blood sugars 1 time per day dx: e11.65, Disp: 1 kit, Rfl: 1   dapagliflozin propanediol (FARXIGA) 10 MG TABS tablet, Take 1 tablet (10 mg total) by mouth daily., Disp: 90 tablet, Rfl: 3   sildenafil (REVATIO) 20 MG tablet, Take 1 tablet (20 mg total) by mouth as needed. MAX 3 TO 5 TABLETS PER DAY, Disp: 60 tablet, Rfl: 1   tiZANidine (ZANAFLEX) 4 MG tablet, Take 1 tablet (4 mg total) by mouth daily., Disp: 30 tablet, Rfl: 1  Current Facility-Administered Medications:    cefTRIAXone (ROCEPHIN) injection 1 g, 1 g, Intramuscular, Once, Minette Brine, [name]   triamcinolone acetonide (KENALOG-40) injection 40 mg, 40 mg, Intramuscular, Once, Minette Brine, [name]   No Known Allergies   Review of Systems  Constitutional: Negative.   Respiratory: Negative.    Cardiovascular: Negative.   Gastrointestinal: Negative.   Endocrine: Negative for polydipsia, polyphagia and polyuria.  Genitourinary:        Urination is slow and has nocturia. Family history of prostate cancer  Neurological: Negative.   Psychiatric/Behavioral: Negative.       Today's Vitals   07/23/22 1131  BP: 134/72  Pulse: 60  Temp: 98.5 F (36.9 C)  TempSrc: Oral  Weight: 198 lb (89.8 kg)  Height: _0  (1.778 m)   Body mass index is 28.41 kg/m.   Objective:  Physical Exam Vitals reviewed.  Constitutional:      General: He is not in acute distress.    Appearance: Normal appearance.  HENT:     Head: Normocephalic.  Cardiovascular:     Rate and Rhythm: Normal rate and regular rhythm.     Pulses: Normal pulses.     Heart sounds: Normal heart sounds. No murmur heard. Pulmonary:     Effort: Pulmonary effort is normal. No respiratory distress.     Breath sounds: Normal breath sounds. No stridor. No wheezing or rhonchi.  Chest:      Chest wall: No tenderness.  Skin:    General: Skin is warm and dry.     Capillary Refill: Capillary refill takes less than 2 seconds.  Neurological:     General: No focal deficit present.     Mental Status: He is alert and oriented to person, place, and time.  Psychiatric:        Mood and Affect: Mood normal.        Behavior: Behavior normal.        Thought Content: Thought content normal.        Judgment: Judgment normal.         Assessment And Plan:     1. Type 2 diabetes mellitus with stage 2 chronic kidney disease, without long-term current use of insulin (HCC) Comments: Hemoglobin A1c stable continue current medications was not able to get Januvia. Will discussed with pharmacy on options he is taking [place] currently - Microalbumin / Creatinine Urine Ratio - Hemoglobin A1c - BMP8+EGFR  2. Hypertensive nephropathy Comments: Blood pressure is fairly controlled continue focusing on lifestyle modifications and taking medications - BMP8+EGFR  3. Decreased urine stream Comments: Has had a change in urinary stream, will refer to your allergy for further evaluation.  Will also check PSA - PSA - Ambulatory referral to Urology  4. Need for influenza vaccination Influenza vaccine administered Encouraged to take Tylenol as needed for fever or muscle aches. - Flu Vaccine QUAD High Dose(Fluad)  5. Encounter for immunization - Pneumococcal conjugate vaccine 20-valent (Prevnar 20)    Patient was given opportunity to ask questions. Patient verbalized understanding of the plan and was able to repeat key elements of the plan. All questions were answered to their satisfaction.  Minette Brine, [name]   I, Minette Brine, [name], have reviewed all documentation for this visit. The documentation on 07/23/22 for the exam, diagnosis, procedures, and orders are all accurate and complete.   IF YOU HAVE BEEN REFERRED TO A SPECIALIST, IT MAY TAKE 1-2 WEEKS TO SCHEDULE/PROCESS THE REFERRAL. IF YOU HAVE  NOT HEARD FROM US/SPECIALIST IN TWO WEEKS, PLEASE GIVE [place] A CALL AT [phone].   THE PATIENT IS ENCOURAGED TO PRACTICE SOCIAL DISTANCING DUE TO THE COVID-19 PANDEMIC.

## 2022-07-23 NOTE — Patient Instructions (Signed)

## 2022-07-24 LAB — BMP8+EGFR
BUN/Creatinine Ratio: 16 (ref 10–24)
BUN: 18 mg/dL (ref 8–27)
CO2: 22 mmol/L (ref 20–29)
Calcium: 10.1 mg/dL (ref 8.6–10.2)
Chloride: 104 mmol/L (ref 96–106)
Creatinine, Ser: 1.16 mg/dL (ref 0.76–1.27)
Glucose: 238 mg/dL — ABNORMAL HIGH (ref 70–99)
Potassium: 5.1 mmol/L (ref 3.5–5.2)
Sodium: 141 mmol/L (ref 134–144)
eGFR: 66 mL/min/{1.73_m2} (ref >59)

## 2022-07-24 LAB — PSA: Prostate Specific Ag, Serum: 1.5 ng/mL (ref 0.0–4.0)

## 2022-07-24 LAB — HEMOGLOBIN A1C
Est. average glucose Bld gHb Est-mCnc: 200 mg/dL
Hgb A1c MFr Bld: 8.6 % — ABNORMAL HIGH (ref 4.8–5.6)

## 2022-07-30 ENCOUNTER — Telehealth: Payer: Self-pay

## 2022-07-30 NOTE — Chronic Care Management (AMB) (Addendum)
  Warren [name] was reminded to have all medications, supplements and any blood glucose and blood pressure readings available for review with [name], Pharm. D, at his telephone visit on 08-01-2022 at 11:00.   Questions: Have you had any recent office visit or specialist visit outside of [facility] systems? Patient stated no  Are there any concerns you would like to discuss during your office visit? Patient stated no  Are you having any problems obtaining your medications? (Whether it pharmacy issues or cost) Patient stated no  If patient has any PAP medications ask if they are having any problems getting their PAP medication or refill? Patient stated no  Care Gaps: Covid booster overdue UACR overdue Yearly ophthalmology overdue  Yearly foot exam overdue  2nd shingrix overdue   Star Rating Drug: Atorvastatin 40 mg- Last filled 07-22-2022 90 DS. Previous 04-21-2022 90 DS Farxiga 10 mg- PAP   Any gaps in medications fill history? No  Huey Romans [facility] Clinical Pharmacist Assistant [phone]

## 2022-08-01 ENCOUNTER — Ambulatory Visit (INDEPENDENT_AMBULATORY_CARE_PROVIDER_SITE_OTHER): Payer: Medicare HMO

## 2022-08-01 DIAGNOSIS — E1122 Type 2 diabetes mellitus with diabetic chronic kidney disease: Secondary | ICD-10-CM

## 2022-08-01 DIAGNOSIS — I10 Essential (primary) hypertension: Secondary | ICD-10-CM

## 2022-08-01 NOTE — Progress Notes (Cosign Needed)
Chronic Care Management Pharmacy Note  08/08/2022 Name:  Warren [name] MRN:  [id number] DOB:  05/02/1946  Summary: He has been doing well for the most part.   Recommendations/Changes made from today's visit: Recommend patient receive sample of Janumet XR 05-999 mg tablet once per Recommend patient follow up with specialist on referral for urologist  Recommend patient assistance be completed for Janumet due to patients tolerability    Plan: Patient assistance application to be sent to Mr. Wickstrom to fill out the paper for medication Patient is going to follow up with the urologist to let the know of the appointment 2. Eye exam completed in beginning of the year from Dr. Katy Fitch.   Subjective: Warren [name] is an 76 y.o. year old male who is a primary patient of Warren Chard, MD.  The CCM team was consulted for assistance with disease management and care coordination needs.    Engaged with patient by telephone for follow up visit in response to provider referral for pharmacy case management and/or care coordination services.   Consent to Services:  The patient was given information about Chronic Care Management services, agreed to services, and gave verbal consent prior to initiation of services.  Please see initial visit note for detailed documentation.   Patient Care Team: Warren Chard, MD as PCP - General (Internal Medicine) Warren [name], [facility] (Pharmacist)  Recent office visits: 07/23/2022 PCP OV  03/17/2022 PCP OV   Recent consult visits: None noted   Hospital visits: None in previous 6 months   Objective:  Lab Results  Component Value Date   CREATININE 1.16 07/23/2022   BUN 18 07/23/2022   EGFR 66 07/23/2022   GFRNONAA 54 (L) 12/26/2021   GFRAA 68 02/03/2019   NA 141 07/23/2022   K 5.1 07/23/2022   CALCIUM 10.1 07/23/2022   CO2 22 07/23/2022   GLUCOSE 238 (H) 07/23/2022    Lab Results  Component Value Date/Time   HGBA1C 8.6 (H) 07/23/2022  12:32 PM   HGBA1C 9.7 (H) 03/17/2022 02:03 PM   MICROALBUR 10 07/10/2021 10:17 AM   MICROALBUR 30 03/26/2020 03:14 PM    Last diabetic Eye exam: No results found for: "HMDIABEYEEXA"  Last diabetic Foot exam: No results found for: "HMDIABFOOTEX"   Lab Results  Component Value Date   CHOL 197 07/10/2021   HDL 72 07/10/2021   LDLCALC 98 07/10/2021   TRIG 157 (H) 07/10/2021   CHOLHDL 2.7 07/10/2021       Latest Ref Rng & Units 03/17/2022    2:03 PM 10/03/2021    9:38 AM 06/03/2021   10:09 AM  Hepatic Function  Total Protein 6.0 - 8.5 g/dL 7.4  6.6  7.2   Albumin 3.8 - 4.8 g/dL 4.5  4.3  4.2   AST 0 - 40 IU/L _0 ALT 0 - 44 IU/L _1 Alk Phosphatase 44 - 121 IU/L 66  61  68   Total Bilirubin 0.0 - 1.2 mg/dL 0.5  0.9  0.7     Lab Results  Component Value Date/Time   TSH 1.60 10/01/2017 12:00 AM       Latest Ref Rng & Units 01/01/2022   11:49 AM 12/26/2021    2:39 AM 12/25/2021    3:05 AM  CBC  WBC 3.4 - 10.8 x10E3/uL 13.0  18.7  24.3   Hemoglobin 13.0 - 17.7 g/dL 13.0  11.7  13.2  Hematocrit 37.5 - 51.0 % 38.0  33.4  38.0   Platelets 150 - 450 x10E3/uL 402  182  186     Lab Results  Component Value Date/Time   VD25OH 36.1 02/03/2018 12:00 AM    Clinical ASCVD: No  The ASCVD Risk score (Arnett DK, et al., 2019) failed to calculate for the following reasons:   Unable to determine if patient is Non-Hispanic African American       07/23/2022   11:25 AM 03/12/2022    3:36 PM 01/01/2022   10:36 AM  Depression screen PHQ 2/9  Decreased Interest 0 0 0  Down, Depressed, Hopeless 0 0 0  PHQ - 2 Score 0 0 0     Social History   Tobacco Use  Smoking Status Never  Smokeless Tobacco Never   BP Readings from Last 3 Encounters:  08/07/22 124/78  07/23/22 134/72  03/17/22 118/80   Pulse Readings from Last 3 Encounters:  08/07/22 (!) 59  07/23/22 60  03/17/22 68   Wt Readings from Last 3 Encounters:  08/07/22 198 lb (89.8 kg)  07/23/22 198 lb  (89.8 kg)  03/17/22 190 lb (86.2 kg)   BMI Readings from Last 3 Encounters:  08/07/22 28.41 kg/m  07/23/22 28.41 kg/m  03/17/22 27.26 kg/m    Assessment/Interventions: Review of patient past medical history, allergies, medications, health status, including review of consultants reports, laboratory and other test data, was performed as part of comprehensive evaluation and provision of chronic care management services.   SDOH:  (Social Determinants of Health) assessments and interventions performed: No SDOH Interventions    Flowsheet Row Chronic Care Management from 11/15/2021 in [facility] Chronic Care Management from 01/29/2021 in [facility] Clinical Support from 07/12/2019 in [facility]  SDOH Interventions     Depression Interventions/Treatment  -- -- MWN0-2 Score <4 Follow-up Not Indicated  Financial Strain Interventions --  [Patient assistance application completed] --  [Patient assistance for high cost medications] --      SDOH Screenings   Food Insecurity: No Food Insecurity (03/12/2022)  Transportation Needs: No Transportation Needs (03/12/2022)  Depression (PHQ2-9): Low Risk  (07/23/2022)  Financial Resource Strain: Low Risk  (03/12/2022)  Physical Activity: Inactive (03/12/2022)  Stress: No Stress Concern Present (03/12/2022)  Tobacco Use: Low Risk  (07/23/2022)    [place]  No Known Allergies  Medications Reviewed Today     Reviewed by Minette Brine, [name] (Family Nurse Practitioner) on 07/23/22 at 1221  Med List Status: <None>   Medication Order Taking? Sig Documenting Provider Last Dose Status Informant  ACCU-CHEK GUIDE test strip [id number] No Use as instructed to check blood sugars 1 time per day Minette Brine, [name] Taking Active   albuterol (VENTOLIN HFA) 108 (90 Base) MCG/ACT inhaler [id number] No Inhale 2 puffs into the lungs every 6 (six) hours as needed for wheezing or shortness of breath.  Warren Chard, MD Taking Active   amLODipine (NORVASC) 5 MG tablet [id number]  Take 1 tablet (5 mg total) by mouth daily. Warren Chard, MD  Active   atorvastatin (LIPITOR) 40 MG tablet [id number] No Take 40 mg by mouth daily. [provider] Taking Active Self  Blood Glucose Monitoring Suppl (ONE TOUCH ULTRA 2) w/Device KIT [id number] No Use as directed to check blood sugars 1 time per day dx: e11.65 Warren Chard, MD Taking Active Self  cefTRIAXone (ROCEPHIN) injection 1 g [id number]   Minette Brine, [name]  Active   dapagliflozin  propanediol (FARXIGA) 10 MG TABS tablet [id number]  Take 1 tablet (10 mg total) by mouth daily. Warren Chard, MD  Active   sildenafil (REVATIO) 20 MG tablet [id number]  Take 1 tablet (20 mg total) by mouth as needed. MAX 3 TO 5 TABLETS PER DAY Minette Brine, [name]  Active   tiZANidine (ZANAFLEX) 4 MG tablet [id number]  Take 1 tablet (4 mg total) by mouth daily. Minette Brine, [name]  Active   triamcinolone acetonide [facility]) injection 40 mg [id number]   Minette Brine, [name]  Active             Patient Active Problem List   Diagnosis Date Noted   Sepsis ([place]) 12/24/2021   Primary hypertension 12/24/2021   RLL pneumonia 12/24/2021   AKI (acute kidney injury) ([place]) 12/24/2021   Hyperlipidemia 12/24/2021   Class 1 obesity due to excess calories with serious comorbidity and body mass index (BMI) of 30.0 to 30.9 in adult 06/04/2021   Pruritus 10/13/2018   Type 2 diabetes mellitus with stage 2 chronic kidney disease, without long-term current use of insulin ([place]) 07/06/2018   Hypertensive nephropathy 07/06/2018   Pure hypercholesterolemia 07/06/2018   Uncontrolled REM sleep behavior disorder 10/14/2017   Snoring 10/14/2017   Nocturia more than twice per night 10/14/2017   CKD (chronic kidney disease) stage 2, GFR 60-89 ml/min 10/14/2017   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    Immunization History  Administered Date(s) Administered   Fluad Quad(high Dose  65+) 07/23/2022   PFIZER(Purple Top)SARS-COV-2 Vaccination 12/01/2019, 12/26/2019, 06/29/2020   PNEUMOCOCCAL CONJUGATE-20 07/23/2022   Tdap 07/19/2013   Zoster Recombinat (Shingrix) 03/17/2022, 08/07/2022    Conditions to be addressed/monitored:  Hypertension and Diabetes  Care Plan : [place]  Updates made by Warren [name], [place] since 08/08/2022 12:00 AM     Problem: HLD, DM II   Priority: High     Long-Range Goal: Disease Management   Recent Progress: On track  Priority: High  Note:   Current Barriers:  Unable to independently afford treatment regimen Unable to independently monitor therapeutic efficacy  Pharmacist Clinical Goal(s):  Patient will achieve control of diabetes as evidenced by A1c <8 through collaboration with PharmD and provider.   Interventions: 1:1 collaboration with Warren Chard, MD regarding development and update of comprehensive plan of care as evidenced by provider attestation and co-signature Inter-disciplinary care team collaboration (see longitudinal plan of care) Comprehensive medication review performed; medication list updated in electronic medical record  Hypertension (BP goal <130/80) -Controlled -Current treatment: Amlodipine 5 mg tablet once per day Appropriate, Effective, Safe, Accessible -Current home readings: 134/70, 130/80, 120/75 -Current dietary habits: he has cut back on his fried foods and he is eating more whole vegetables  -Denies hypotensive/hypertensive symptoms -Educated on BP goals and benefits of medications for prevention of heart attack, stroke and kidney damage; Daily salt intake goal < 2300 mg; Importance of home blood pressure monitoring; -Counseled to monitor BP at home at least , document, and provide log at future appointments -Recommended to continue current medication  Diabetes (A1c goal <8%) -Uncontrolled -Current medications: Janumet 05-999 mg tablet once per day Appropriate, Effective,  Safe, Accessible Farxiga 10 mg tablet once per day Appropriate, Effective, Safe, Accessible -Current home glucose readings fasting glucose: he reports his readings have been alright 183-137 post prandial glucose:   -Denies hypoglycemic/hyperglycemic symptoms -Current meal patterns:  breakfast: french toast, bacon and apple sauce   lunch: vegetable soup  dinner: pizza drinks: drinking more water, at  least 5-6 bottles per day  -Current exercise: 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 Whang ran out of samples of Januvia retried Janumet and thus far it has agreed with his system he has not had any side effects.  -Counseled to check feet daily and get yearly eye exams -Recommended to continue current medication  Patient Goals/Self-Care Activities Patient will:  - take medications as prescribed as evidenced by patient report and record review  Follow Up Plan: The patient has been provided with contact information for the care management team and has been advised to call with any health related questions or concerns.        Medication Assistance: None required.  Patient affirms current coverage meets needs.  Compliance/Adherence/Medication fill history: Care Gaps: COVID-19 Vaccine Foot Exam Ophthalmology Exam Diabetic Kidney Evaluation  Star-Rating Drugs: Atorvastatin 40 mg tablet  Farxiga 10 mg tablet   Patient's preferred pharmacy is:  [place], [place] [address] [place] [address] Phone: [phone] Fax: [place], [place] - [address] AT [address] [place] [address] Phone: [phone] Fax: [phone]  Uses pill box? Yes Pt endorses 85% compliance  We discussed: Benefits of medication synchronization, packaging and  delivery as well as enhanced pharmacist oversight with [employer]. Patient decided to: Continue current medication management strategy  Care Plan and Follow Up Patient Decision:  Patient agrees to Care Plan and Follow-up.  Plan: The patient has been provided with contact information for the care management team and has been advised to call with any health related questions or concerns.   [name], CPP, PharmD Clinical Pharmacist Practitioner [facility] [phone]

## 2022-08-07 ENCOUNTER — Ambulatory Visit (INDEPENDENT_AMBULATORY_CARE_PROVIDER_SITE_OTHER): Payer: Medicare HMO

## 2022-08-07 VITALS — BP 124/78 | HR 59 | Ht 70.0 in | Wt 198.0 lb

## 2022-08-07 DIAGNOSIS — Z23 Encounter for immunization: Secondary | ICD-10-CM

## 2022-08-07 NOTE — Progress Notes (Signed)
Patient presents today for his 2nd shingles vaccine. YL,RMA

## 2022-08-08 NOTE — Patient Instructions (Signed)
Visit Information It was great speaking with you today!  Please let me know if you have any questions about our visit.   Goals Addressed             This Visit's Progress    Manage My Medicine       Timeframe:  Long-Range Goal Priority:  High             Expected End Date:                       Follow Up Date Pending   In progress: - call for medicine refill 2 or 3 days before it runs out - call if I am sick and can't take my medicine - keep a list of all the medicines I take; vitamins and herbals too    Why is this important?   These steps will help you keep on track with your medicines.  Please call if you have any other questions         Patient Care Plan: CCM Pharmacy Care Plan     Problem Identified: HLD, DM II   Priority: High     Long-Range Goal: Disease Management   Recent Progress: On track  Priority: High  Note:   Current Barriers:  Unable to independently afford treatment regimen Unable to independently monitor therapeutic efficacy  Pharmacist Clinical Goal(s):  Patient will achieve control of diabetes as evidenced by A1c <8 through collaboration with PharmD and provider.   Interventions: 1:1 collaboration with Dorothyann Peng, MD regarding development and update of comprehensive plan of care as evidenced by provider attestation and co-signature Inter-disciplinary care team collaboration (see longitudinal plan of care) Comprehensive medication review performed; medication list updated in electronic medical record  Hypertension (BP goal <130/80) -Controlled -Current treatment: Amlodipine 5 mg tablet once per day Appropriate, Effective, Safe, Accessible -Current home readings: 134/70, 130/80, 120/75 -Current dietary habits: he has cut back on his fried foods and he is eating more whole vegetables  -Denies hypotensive/hypertensive symptoms -Educated on BP goals and benefits of medications for prevention of heart attack, stroke and kidney  damage; Daily salt intake goal < 2300 mg; Importance of home blood pressure monitoring; -Counseled to monitor BP at home at least , document, and provide log at future appointments -Recommended to continue current medication  Diabetes (A1c goal <8%) -Uncontrolled -Current medications: Janumet 05-999 mg tablet once per day Appropriate, Effective, Safe, Accessible Farxiga 10 mg tablet once per day Appropriate, Effective, Safe, Accessible -Current home glucose readings fasting glucose: he reports his readings have been alright 183-137 post prandial glucose:   -Denies hypoglycemic/hyperglycemic symptoms -Current meal patterns:  breakfast: french toast, bacon and apple sauce   lunch: vegetable soup  dinner: pizza drinks: drinking more water, at least 5-6 bottles per day  -Current exercise: 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 ran out of samples of Januvia retried Janumet and thus far it has agreed with his system he has not had any side effects.  -Counseled to check feet daily and get yearly eye exams -Recommended to continue current medication  Patient Goals/Self-Care Activities Patient will:  - take medications as prescribed as evidenced by patient report and record review  Follow Up Plan: The patient has been provided with contact information for the care management team and has been advised to call with any health related  questions or concerns.        Patient agreed to services and verbal consent obtained.   The patient verbalized understanding of instructions, educational materials, and care plan provided today and agreed to receive a mailed copy of patient instructions, educational materials, and care plan.   Warren [name], PharmD Clinical Pharmacist [facility] [phone]

## 2022-08-10 DIAGNOSIS — I10 Essential (primary) hypertension: Secondary | ICD-10-CM | POA: Diagnosis not present

## 2022-08-10 DIAGNOSIS — Z7984 Long term (current) use of oral hypoglycemic drugs: Secondary | ICD-10-CM

## 2022-08-10 DIAGNOSIS — E1159 Type 2 diabetes mellitus with other circulatory complications: Secondary | ICD-10-CM | POA: Diagnosis not present

## 2022-09-04 ENCOUNTER — Other Ambulatory Visit: Payer: Self-pay

## 2022-09-04 MED ORDER — TIZANIDINE HCL 4 MG PO TABS: 4.0000 mg | ORAL_TABLET | Freq: Every day | ORAL | 1 refills | Status: DC

## 2022-10-27 ENCOUNTER — Other Ambulatory Visit: Payer: Self-pay | Admitting: Nurse Practitioner

## 2022-11-14 ENCOUNTER — Telehealth: Payer: Self-pay

## 2022-11-14 ENCOUNTER — Other Ambulatory Visit: Payer: Self-pay | Admitting: Internal Medicine

## 2022-11-14 NOTE — Progress Notes (Cosign Needed)
Faxed refill order form to [facility] for Farxiga 10 mg.   Billee Cashing, CMA Clinical Pharmacist Assistant [phone]

## 2022-11-26 ENCOUNTER — Encounter: Payer: Self-pay | Admitting: Nurse Practitioner

## 2022-11-26 ENCOUNTER — Ambulatory Visit (INDEPENDENT_AMBULATORY_CARE_PROVIDER_SITE_OTHER): Payer: Medicare HMO | Admitting: Nurse Practitioner

## 2022-11-26 VITALS — BP 124/68 | HR 62 | Temp 98.1°F | Ht 70.0 in | Wt 202.0 lb

## 2022-11-26 DIAGNOSIS — N182 Chronic kidney disease, stage 2 (mild): Secondary | ICD-10-CM | POA: Diagnosis not present

## 2022-11-26 DIAGNOSIS — I10 Essential (primary) hypertension: Secondary | ICD-10-CM | POA: Insufficient documentation

## 2022-11-26 DIAGNOSIS — R3911 Hesitancy of micturition: Secondary | ICD-10-CM

## 2022-11-26 DIAGNOSIS — I129 Hypertensive chronic kidney disease with stage 1 through stage 4 chronic kidney disease, or unspecified chronic kidney disease: Secondary | ICD-10-CM

## 2022-11-26 DIAGNOSIS — E782 Mixed hyperlipidemia: Secondary | ICD-10-CM

## 2022-11-26 DIAGNOSIS — E1122 Type 2 diabetes mellitus with diabetic chronic kidney disease: Secondary | ICD-10-CM | POA: Diagnosis not present

## 2022-11-26 DIAGNOSIS — E119 Type 2 diabetes mellitus without complications: Secondary | ICD-10-CM | POA: Insufficient documentation

## 2022-11-26 MED ORDER — TAMSULOSIN HCL 0.4 MG PO CAPS: 0.4000 mg | ORAL_CAPSULE | Freq: Every day | ORAL | 2 refills | Status: DC

## 2022-11-26 NOTE — Progress Notes (Signed)
I,Sheena H Holbrook,acting as a Neurosurgeon for [name], [name].,have documented all relevant documentation on the behalf of [name], [name],as directed by  [name], [name] while in the presence of [name], [name].    Subjective:     Patient ID: Warren [name] , male    DOB: 1946-06-28 , 77 y.o.   MRN: [id number]   Chief Complaint  Patient presents with   Medical Management of Chronic Issues    HPI  Patient presents today for DM follow up. He is not eating a poorly as before, not eating out as much and caring for his mother full time. He is eating vegetables more. He is not eating as many fried foods. Patient has no other complaints or concerns. He does not need any refills at this time. He is getting farxiga via patient assistance.   He verbalizes "he is here and his mother is at home by herself".      Past Medical History:  Diagnosis Date   Chronic kidney disease    stage 2   Diabetes mellitus without complication    Hypertension    Malaise and fatigue      Family History  Problem Relation Age of Onset   Hypertension Mother    Hypothyroidism Mother    Alzheimer's disease Father    Prostate cancer Father    Prostate cancer Brother      Current Outpatient Medications:    ACCU-CHEK GUIDE test strip, Use as instructed to check blood sugars 1 time per day, Disp: 50 strip, Rfl: 11   albuterol (VENTOLIN HFA) 108 (90 Base) MCG/ACT inhaler, Inhale 2 puffs into the lungs every 6 (six) hours as needed for wheezing or shortness of breath., Disp: 8 g, Rfl: 2   amLODipine (NORVASC) 5 MG tablet, Take 1 tablet (5 mg total) by mouth daily., Disp: 90 tablet, Rfl: 1   atorvastatin (LIPITOR) 80 MG tablet, Take 80 mg by mouth daily., Disp: , Rfl:    Blood Glucose Monitoring Suppl (ONE TOUCH ULTRA 2) w/Device KIT, Use as directed to check blood sugars 1 time per day dx: e11.65, Disp: 1 kit, Rfl: 1   dapagliflozin propanediol (FARXIGA) 10 MG TABS tablet, Take 1 tablet (10 mg total) by  mouth daily., Disp: 90 tablet, Rfl: 3   sildenafil (REVATIO) 20 MG tablet, Take 1 tablet (20 mg total) by mouth as needed. MAX 3 TO 5 TABLETS PER DAY, Disp: 60 tablet, Rfl: 1   tamsulosin (FLOMAX) 0.4 MG CAPS capsule, Take 1 capsule (0.4 mg total) by mouth daily after breakfast., Disp: 30 capsule, Rfl: 2   tiZANidine (ZANAFLEX) 4 MG tablet, Take 1 tablet (4 mg total) by mouth daily., Disp: 30 tablet, Rfl: 1  Current Facility-Administered Medications:    cefTRIAXone (ROCEPHIN) injection 1 g, 1 g, Intramuscular, Once, [name], [name]   triamcinolone acetonide (KENALOG-40) injection 40 mg, 40 mg, Intramuscular, Once, [name], [name]   No Known Allergies   Review of Systems  Constitutional: Negative.   Respiratory: Negative.    Cardiovascular: Negative.   Gastrointestinal: Negative.   Endocrine: Negative for polydipsia, polyphagia and polyuria.  Genitourinary:        Urination is slow and has nocturia. Family history of prostate cancer  Neurological: Negative.   Psychiatric/Behavioral: Negative.       Today's Vitals   11/26/22 0949  BP: 124/68  Pulse: 62  Temp: 98.1 F (36.7 C)  TempSrc: Oral  SpO2: 98%  Weight: 202 lb (91.6 kg)  Height:  (1.778 m)   Body mass index is 28.98 kg/m.   Objective:  Physical Exam Vitals reviewed.  Constitutional:      General: He is not in acute distress.    Appearance: Normal appearance.  HENT:     Head: Normocephalic.  Cardiovascular:     Rate and Rhythm: Normal rate and regular rhythm.     Pulses: Normal pulses.     Heart sounds: Normal heart sounds. No murmur heard. Pulmonary:     Effort: Pulmonary effort is normal. No respiratory distress.     Breath sounds: Normal breath sounds. No stridor. No wheezing or rhonchi.  Chest:     Chest wall: No tenderness.  Musculoskeletal:        General: Normal range of motion.  Skin:    General: Skin is warm and dry.     Capillary Refill: Capillary refill takes less than 2 seconds.   Neurological:     General: No focal deficit present.     Mental Status: He is alert and oriented to person, place, and time.     Cranial Nerves: No cranial nerve deficit.     Motor: No weakness.  Psychiatric:        Mood and Affect: Mood normal.        Behavior: Behavior normal.        Thought Content: Thought content normal.        Judgment: Judgment normal.         Assessment And Plan:     1. Type 2 diabetes mellitus with stage 2 chronic kidney disease, without long-term current use of insulin Comments: Hgba1c is improving, continue current medications - Hemoglobin A1c - Microalbumin / creatinine urine ratio - BMP8+eGFR  2. Hypertensive nephropathy Comments: Blood pressure is well-controlled continue current medications.  Will check urine micro.  3. Mixed hyperlipidemia Comments: Cholesterol levels are stable.  Continue current medications tolerating well. - BMP8+eGFR - Lipid panel  4. Urinary hesitancy Comments: Will trial him on Flomax, advised to sit up for at least 30 minutes after taking medication in the morning. He would like to hold off on seeing the Urologist - tamsulosin (FLOMAX) 0.4 MG CAPS capsule; Take 1 capsule (0.4 mg total) by mouth daily after breakfast.  Dispense: 30 capsule; Refill: 2     Patient was given opportunity to ask questions. Patient verbalized understanding of the plan and was able to repeat key elements of the plan. All questions were answered to their satisfaction.  [name], [name]   I, [name], [name], have reviewed all documentation for this visit. The documentation on 11/26/22 for the exam, diagnosis, procedures, and orders are all accurate and complete.   IF YOU HAVE BEEN REFERRED TO A SPECIALIST, IT MAY TAKE 1-2 WEEKS TO SCHEDULE/PROCESS THE REFERRAL. IF YOU HAVE NOT HEARD FROM US/SPECIALIST IN TWO WEEKS, PLEASE GIVE [place] A CALL AT [phone] X [phone].   THE PATIENT IS ENCOURAGED TO PRACTICE SOCIAL DISTANCING DUE TO THE COVID-19  PANDEMIC.

## 2022-11-26 NOTE — Patient Instructions (Signed)
Give him the option to make upcoming appts virtual

## 2022-11-27 ENCOUNTER — Other Ambulatory Visit: Payer: Self-pay | Admitting: Nurse Practitioner

## 2022-11-27 LAB — BMP8+EGFR
BUN/Creatinine Ratio: 16 (ref 10–24)
BUN: 22 mg/dL (ref 8–27)
CO2: 23 mmol/L (ref 20–29)
Calcium: 10.2 mg/dL (ref 8.6–10.2)
Chloride: 101 mmol/L (ref 96–106)
Creatinine, Ser: 1.39 mg/dL — ABNORMAL HIGH (ref 0.76–1.27)
Glucose: 378 mg/dL — ABNORMAL HIGH (ref 70–99)
Potassium: 5.3 mmol/L — ABNORMAL HIGH (ref 3.5–5.2)
Sodium: 139 mmol/L (ref 134–144)
eGFR: 53 mL/min/{1.73_m2} — ABNORMAL LOW (ref >59)

## 2022-11-27 LAB — HEMOGLOBIN A1C
Est. average glucose Bld gHb Est-mCnc: 318 mg/dL
Hgb A1c MFr Bld: 12.7 % — ABNORMAL HIGH (ref 4.8–5.6)

## 2022-11-27 LAB — LIPID PANEL
Chol/HDL Ratio: 2.5 ratio (ref 0.0–5.0)
Cholesterol, Total: 184 mg/dL (ref 100–199)
HDL: 75 mg/dL (ref >39)
LDL Chol Calc (NIH): 88 mg/dL (ref 0–99)
Triglycerides: 119 mg/dL (ref 0–149)
VLDL Cholesterol Cal: 21 mg/dL (ref 5–40)

## 2022-11-27 LAB — MICROALBUMIN / CREATININE URINE RATIO
Creatinine, Urine: 79.8 mg/dL
Microalb/Creat Ratio: 36 mg/g creat — ABNORMAL HIGH (ref 0–29)
Microalbumin, Urine: 28.4 ug/mL

## 2022-12-09 NOTE — Progress Notes (Signed)
We can send a prescription for Warren [name] and give him a round of samples and if his insurance does not cover we can see if he can get patient assistance.

## 2022-12-15 ENCOUNTER — Other Ambulatory Visit: Payer: Self-pay | Admitting: Nurse Practitioner

## 2022-12-17 ENCOUNTER — Other Ambulatory Visit: Payer: Self-pay | Admitting: Internal Medicine

## 2022-12-25 ENCOUNTER — Other Ambulatory Visit: Payer: Self-pay

## 2022-12-25 DIAGNOSIS — E1122 Type 2 diabetes mellitus with diabetic chronic kidney disease: Secondary | ICD-10-CM

## 2022-12-25 MED ORDER — SITAGLIPTIN PHOSPHATE 100 MG PO TABS: 100.0000 mg | ORAL_TABLET | Freq: Every day | ORAL | 2 refills | Status: DC

## 2023-01-21 ENCOUNTER — Other Ambulatory Visit: Payer: Self-pay | Admitting: Nurse Practitioner

## 2023-01-21 DIAGNOSIS — R3911 Hesitancy of micturition: Secondary | ICD-10-CM

## 2023-03-19 ENCOUNTER — Ambulatory Visit: Payer: Medicare HMO | Admitting: Nurse Practitioner

## 2023-04-02 ENCOUNTER — Ambulatory Visit (INDEPENDENT_AMBULATORY_CARE_PROVIDER_SITE_OTHER): Payer: Medicare HMO | Admitting: Nurse Practitioner

## 2023-04-02 ENCOUNTER — Ambulatory Visit (INDEPENDENT_AMBULATORY_CARE_PROVIDER_SITE_OTHER): Payer: Medicare HMO

## 2023-04-02 ENCOUNTER — Encounter: Payer: Self-pay | Admitting: Nurse Practitioner

## 2023-04-02 VITALS — BP 122/60 | HR 72 | Temp 97.9°F | Ht 67.0 in | Wt 201.0 lb

## 2023-04-02 VITALS — BP 122/60 | HR 72 | Temp 97.9°F | Ht 67.8 in | Wt 201.6 lb

## 2023-04-02 DIAGNOSIS — E6609 Other obesity due to excess calories: Secondary | ICD-10-CM | POA: Diagnosis not present

## 2023-04-02 DIAGNOSIS — E1122 Type 2 diabetes mellitus with diabetic chronic kidney disease: Secondary | ICD-10-CM | POA: Diagnosis not present

## 2023-04-02 DIAGNOSIS — E782 Mixed hyperlipidemia: Secondary | ICD-10-CM

## 2023-04-02 DIAGNOSIS — Z0000 Encounter for general adult medical examination without abnormal findings: Secondary | ICD-10-CM

## 2023-04-02 DIAGNOSIS — F5102 Adjustment insomnia: Secondary | ICD-10-CM | POA: Diagnosis not present

## 2023-04-02 DIAGNOSIS — N182 Chronic kidney disease, stage 2 (mild): Secondary | ICD-10-CM

## 2023-04-02 DIAGNOSIS — I10 Essential (primary) hypertension: Secondary | ICD-10-CM

## 2023-04-02 DIAGNOSIS — Z79899 Other long term (current) drug therapy: Secondary | ICD-10-CM

## 2023-04-02 DIAGNOSIS — Z6831 Body mass index (BMI) 31.0-31.9, adult: Secondary | ICD-10-CM

## 2023-04-02 DIAGNOSIS — I129 Hypertensive chronic kidney disease with stage 1 through stage 4 chronic kidney disease, or unspecified chronic kidney disease: Secondary | ICD-10-CM | POA: Diagnosis not present

## 2023-04-02 DIAGNOSIS — F4321 Adjustment disorder with depressed mood: Secondary | ICD-10-CM | POA: Insufficient documentation

## 2023-04-02 DIAGNOSIS — Z7985 Long-term (current) use of injectable non-insulin antidiabetic drugs: Secondary | ICD-10-CM | POA: Diagnosis not present

## 2023-04-02 MED ORDER — TRAZODONE HCL 50 MG PO TABS: 50.0000 mg | ORAL_TABLET | Freq: Every day | ORAL | 2 refills | Status: DC

## 2023-04-02 MED ORDER — RYBELSUS 7 MG PO TABS: 1.0000 | ORAL_TABLET | Freq: Every day | ORAL | 2 refills | Status: DC

## 2023-04-02 NOTE — Patient Instructions (Signed)
Warren [name] , Thank you for taking time to come for your Medicare Wellness Visit. I appreciate your ongoing commitment to your health goals. Please review the following plan we discussed and let me know if I can assist you in the future.   Referrals/Orders/Follow-Ups/Clinician Recommendations: none  This is a list of the screening recommended for you and due dates:  Health Maintenance  Topic Date Due   Eye exam for diabetics  04/09/2019   Flu Shot  03/12/2023   COVID-19 Vaccine (4 - 2023-24 season) 11/26/2027*   Hemoglobin A1C  05/28/2023   DTaP/Tdap/Td vaccine (2 - Td or Tdap) 07/20/2023   Complete foot exam   07/24/2023   Yearly kidney function blood test for diabetes  11/26/2023   Yearly kidney health urinalysis for diabetes  11/26/2023   Medicare Annual Wellness Visit  04/01/2024   Cologuard (Stool DNA test)  05/23/2025   Pneumonia Vaccine  Completed   Hepatitis C Screening  Completed   Zoster (Shingles) Vaccine  Completed   HPV Vaccine  Aged Out  *Topic was postponed. The date shown is not the original due date.    Advanced directives: (Declined) Advance directive discussed with you today. Even though you declined this today, please call our office should you change your mind, and we can give you the proper paperwork for you to fill out.  Next Medicare Annual Wellness Visit scheduled for next year: No, office will schedule appointment  insert Preventive Care attachment Insert FALL PREVENTION attachment if needed

## 2023-04-02 NOTE — Progress Notes (Signed)
Subjective:   Warren [name] is a 77 y.o. male who presents for Medicare Annual/Subsequent preventive examination.  Visit Complete: In person    Review of Systems     Cardiac Risk Factors include: advanced age (>58men, >24 women);diabetes mellitus;dyslipidemia;hypertension;male gender;obesity (BMI >30kg/m2)     Objective:    Today's Vitals   04/02/23 0954  BP: 122/60  Pulse: 72  Temp: 97.9 F (36.6 C)  TempSrc: Oral  SpO2: 99%  Weight: 201 lb 9.6 oz (91.4 kg)  Height: 5' 7.8" (1.722 m)   Body mass index is 30.83 kg/m.     04/02/2023   10:08 AM 03/12/2022    3:34 PM 12/25/2021    3:27 PM 12/24/2021    6:53 PM 12/24/2021    9:57 AM 12/13/2020    9:08 AM 07/12/2019    8:52 AM  Advanced Directives  Does Patient Have a Medical Advance Directive? No No No No No No No  Would patient like information on creating a medical advance directive? No - Patient declined  No - Patient declined   No - Patient declined No - Patient declined    Current Medications (verified) Outpatient Encounter Medications as of 04/02/2023  Medication Sig   ACCU-CHEK GUIDE test strip Use as instructed to check blood sugars 1 time per day   albuterol (VENTOLIN HFA) 108 (90 Base) MCG/ACT inhaler Inhale 2 puffs into the lungs every 6 (six) hours as needed for wheezing or shortness of breath.   amLODipine (NORVASC) 5 MG tablet Take 1 tablet (5 mg total) by mouth daily.   atorvastatin (LIPITOR) 80 MG tablet Take 1 tablet (80 mg total) by mouth daily.   Blood Glucose Monitoring Suppl (ONE TOUCH ULTRA 2) w/Device KIT Use as directed to check blood sugars 1 time per day dx: e11.65   dapagliflozin propanediol (FARXIGA) 10 MG TABS tablet Take 1 tablet (10 mg total) by mouth daily.   sildenafil (REVATIO) 20 MG tablet Take 1 tablet (20 mg total) by mouth as needed. MAX 3 TO 5 TABLETS PER DAY   sitaGLIPtin (JANUVIA) 100 MG tablet Take 1 tablet (100 mg total) by mouth daily.   tamsulosin (FLOMAX) 0.4 MG CAPS capsule  Take 1 capsule (0.4 mg total) by mouth daily after breakfast.   tiZANidine (ZANAFLEX) 4 MG tablet Take 1 tablet (4 mg total) by mouth daily.   Facility-Administered Encounter Medications as of 04/02/2023  Medication   cefTRIAXone (ROCEPHIN) injection 1 g   triamcinolone acetonide (KENALOG-40) injection 40 mg    Allergies (verified) Patient has no known allergies.   History: Past Medical History:  Diagnosis Date   Chronic kidney disease    stage 2   Diabetes mellitus without complication (HCC)    Hypertension    Malaise and fatigue    Past Surgical History:  Procedure Laterality Date   surgical repair     metatarsal repair.    Family History  Problem Relation Age of Onset   Hypertension Mother    Hypothyroidism Mother    Alzheimer's disease Father    Prostate cancer Father    Prostate cancer Brother    Social History   Socioeconomic History   Marital status: Single    Spouse name: Not on file   Number of children: Not on file   Years of education: Not on file   Highest education level: Not on file  Occupational History   Occupation: retired  Tobacco Use   Smoking status: Never   Smokeless tobacco: Never  Vaping Use   Vaping status: Never Used  Substance and Sexual Activity   Alcohol use: Yes    Alcohol/week: 7.0 standard drinks of alcohol    Types: 7 Cans of beer per week    Comment: daily   Drug use: Yes    Types: Marijuana   Sexual activity: Yes  Other Topics Concern   Not on file  Social History Narrative   ** Merged History Encounter **       Social Determinants of Health   Financial Resource Strain: Low Risk  (04/02/2023)   Overall Financial Resource Strain (CARDIA)    Difficulty of Paying Living Expenses: Not hard at all  Food Insecurity: No Food Insecurity (04/02/2023)   Hunger Vital Sign    Worried About Running Out of Food in the Last Year: Never true    Ran Out of Food in the Last Year: Never true  Transportation Needs: No Transportation  Needs (04/02/2023)   PRAPARE - Administrator, Civil Service (Medical): No    Lack of Transportation (Non-Medical): No  Physical Activity: Inactive (04/02/2023)   Exercise Vital Sign    Days of Exercise per Week: 0 days    Minutes of Exercise per Session: 0 min  Stress: No Stress Concern Present (04/02/2023)   [employer] of Occupational Health - Occupational Stress Questionnaire    Feeling of Stress : Not at all  Social Connections: Socially Isolated (04/02/2023)   Social Connection and Isolation Panel [NHANES]    Frequency of Communication with Friends and Family: More than three times a week    Frequency of Social Gatherings with Friends and Family: Once a week    Attends Religious Services: Never    Database administrator or Organizations: No    Attends Engineer, structural: Never    Marital Status: Separated    Tobacco Counseling Counseling given: Not Answered   Clinical Intake:  Pre-visit preparation completed: Yes  Pain : No/denies pain     Nutritional Status: BMI > 30  Obese Nutritional Risks: None Diabetes: Yes CBG done?: No Did pt. bring in CBG monitor from home?: No  How often do you need to have someone help you when you read instructions, pamphlets, or other written materials from your doctor or pharmacy?: 2 - Rarely  Interpreter Needed?: No  Information entered by :: NAllen LPN   Activities of Daily Living    04/02/2023    9:56 AM  In your present state of health, do you have any difficulty performing the following activities:  Hearing? 0  Vision? 0  Difficulty concentrating or making decisions? 1  Comment slight forgetfulness  Walking or climbing stairs? 0  Dressing or bathing? 0  Doing errands, shopping? 0  Preparing Food and eating ? N  Using the Toilet? N  In the past six months, have you accidently leaked urine? Y  Comment taking medication which helps  Do you have problems with loss of bowel control? N  Managing  your Medications? N  Managing your Finances? N  Housekeeping or managing your Housekeeping? N    Patient Care Team: [name], [name] as PCP - General (General Practice) Harlan Stains, [facility] (Inactive) (Pharmacist) [facility]  Indicate any recent Medical Services you may have received from other than [facility] providers in the past year (date may be approximate).     Assessment:   This is a routine wellness examination for Sevyn.  Hearing/Vision screen Hearing Screening -  Comments:: Denies hearing issues Vision Screening - Comments:: No regular eye exams, [facility]  Dietary issues and exercise activities discussed:     Goals Addressed             This Visit's Progress    Patient Stated       04/02/2023, trying to keep diabetes under control       Depression Screen    04/02/2023   10:10 AM 11/26/2022    9:48 AM 07/23/2022   11:25 AM 03/12/2022    3:36 PM 01/01/2022   10:36 AM 12/13/2020    9:09 AM 12/13/2020    8:36 AM  PHQ 2/9 Scores  PHQ - 2 Score 3 0 0 0 0 0 0  PHQ- 9 Score 6          Fall Risk    04/02/2023   10:10 AM 11/26/2022    9:48 AM 07/23/2022   11:24 AM 03/12/2022    3:35 PM 01/01/2022   10:36 AM  Fall Risk   Falls in the past year? 0 0 0 0 0  Number falls in past yr: 0  0 0 0  Injury with Fall? 0  0 0 0  Risk for fall due to : Medication side effect  No Fall Risks Medication side effect No Fall Risks  Follow up Falls prevention discussed;Falls evaluation completed  Falls evaluation completed Falls evaluation completed;Education provided;Falls prevention discussed Falls evaluation completed    MEDICARE RISK AT HOME: Medicare Risk at Home Any stairs in or around the home?: Yes If so, are there any without handrails?: No Home free of loose throw rugs in walkways, pet beds, electrical cords, etc?: Yes Adequate lighting in your home to reduce risk of falls?: Yes Life alert?: No Use of a cane, walker or w/c?: No Grab bars in the  bathroom?: No Shower chair or bench in shower?: No Elevated toilet seat or a handicapped toilet?: Yes  TIMED UP AND GO:  Was the test performed?  Yes  Length of time to ambulate 10 feet: 5 sec Gait steady and fast without use of assistive device    Cognitive Function:        04/02/2023   10:16 AM 03/12/2022    3:38 PM 12/13/2020    9:10 AM 07/12/2019    8:57 AM 05/26/2018   10:14 AM  6CIT Screen  What Year? 0 points 0 points 0 points 0 points 0 points  What month? 0 points 0 points 0 points 0 points 0 points  What time? 0 points 0 points 0 points 0 points 0 points  Count back from 20 0 points 0 points 0 points 0 points 0 points  Months in reverse 0 points 0 points 0 points 0 points 0 points  Repeat phrase 2 points 0 points 2 points 2 points 0 points  Total Score 2 points 0 points 2 points 2 points 0 points    Immunizations Immunization History  Administered Date(s) Administered   Fluad Quad(high Dose 65+) 07/23/2022   PFIZER(Purple Top)SARS-COV-2 Vaccination 12/01/2019, 12/26/2019, 06/29/2020   PNEUMOCOCCAL CONJUGATE-20 07/23/2022   Tdap 07/19/2013   Zoster Recombinant(Shingrix) 03/17/2022, 08/07/2022    TDAP status: Up to date  Flu Vaccine status: Due, Education has been provided regarding the importance of this vaccine. Advised may receive this vaccine at local pharmacy or Health Dept. Aware to provide a copy of the vaccination record if obtained from local pharmacy or Health Dept. Verbalized acceptance and understanding.  Pneumococcal vaccine  status: Up to date  Covid-19 vaccine status: Information provided on how to obtain vaccines.   Qualifies for Shingles Vaccine? Yes   Zostavax completed Yes   Shingrix Completed?: Yes  Screening Tests Health Maintenance  Topic Date Due   OPHTHALMOLOGY EXAM  04/09/2019   INFLUENZA VACCINE  03/12/2023   COVID-19 Vaccine (4 - 2023-24 season) 11/26/2027 (Originally 04/11/2022)   HEMOGLOBIN A1C  05/28/2023   DTaP/Tdap/Td (2 - Td  or Tdap) 07/20/2023   FOOT EXAM  07/24/2023   Diabetic kidney evaluation - eGFR measurement  11/26/2023   Diabetic kidney evaluation - Urine ACR  11/26/2023   Medicare Annual Wellness (AWV)  04/01/2024   Fecal DNA (Cologuard)  05/23/2025   Pneumonia Vaccine 65+ Years old  Completed   Hepatitis C Screening  Completed   Zoster Vaccines- Shingrix  Completed   HPV VACCINES  Aged Out    Health Maintenance  Health Maintenance Due  Topic Date Due   OPHTHALMOLOGY EXAM  04/09/2019   INFLUENZA VACCINE  03/12/2023    Colorectal cancer screening: Type of screening: Cologuard. Completed 05/23/2022. Repeat every 3 years  Lung Cancer Screening: (Low Dose CT Chest recommended if Age 16-80 years, 20 pack-year currently smoking OR have quit w/in 15years.) does not qualify.   Lung Cancer Screening Referral: no  Additional Screening:  Hepatitis C Screening: does qualify; Completed 07/06/2018  Vision Screening: Recommended annual ophthalmology exams for early detection of glaucoma and other disorders of the eye. Is the patient up to date with their annual eye exam?  No  Who is the provider or what is the name of the office in which the patient attends annual eye exams? [facility] If pt is not established with a provider, would they like to be referred to a provider to establish care? No .   Dental Screening: Recommended annual dental exams for proper oral hygiene  Diabetic Foot Exam: Diabetic Foot Exam: Completed 07/23/2022  Community Resource Referral / Chronic Care Management: CRR required this visit?  No   CCM required this visit?  No     Plan:     I have personally reviewed and noted the following in the patient's chart:   Medical and social history Use of alcohol, tobacco or illicit drugs  Current medications and supplements including opioid prescriptions. Patient is not currently taking opioid prescriptions. Functional ability and status Nutritional status Physical  activity Advanced directives List of other physicians Hospitalizations, surgeries, and ER visits in previous 12 months Vitals Screenings to include cognitive, depression, and falls Referrals and appointments  In addition, I have reviewed and discussed with patient certain preventive protocols, quality metrics, and best practice recommendations. A written personalized care plan for preventive services as well as general preventive health recommendations were provided to patient.     Barb Merino, LPN   08/29/1476   After Visit Summary: in person  Nurse Notes: none

## 2023-04-02 NOTE — Progress Notes (Addendum)
Warren [name], CMA,acting as a Neurosurgeon for Warren Felts, [name].,have documented all relevant documentation on the behalf of Warren Felts, [name],as directed by  Warren Felts, [name] while in the presence of Warren Felts, [name].  Subjective:  Patient ID: Warren [name] , male    DOB: 26-Oct-1945 , 77 y.o.   MRN: [id number]  Chief Complaint  Patient presents with   Hypertension   Diabetes    HPI  Patient presented today for BP and DM follow up, patient reports compliance with medications.  Patient denies any chest pain, SOB, or headaches. Patient reports he has a hard time falling asleep and staying asleep every night. He has tried melatonin, zquil which has been ineffective. He did work 3rd shift for 30 years. 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 His eating habits are different. His lady fried is vegan and she does not eat meat. He does eat fish. He was taking Janumet and it made him feel "funny" then he tried Januvia.   BP Readings from Last 3 Encounters: 04/02/23 : 122/60 04/02/23 : 122/60 11/26/22 : 124/68    Diabetes He presents for his follow-up diabetic visit. He has type 2 diabetes mellitus. There are no hypoglycemic associated symptoms. Pertinent negatives for hypoglycemia include no nervousness/anxiousness. Pertinent negatives for diabetes include no polydipsia, no polyphagia and no polyuria. There are no hypoglycemic complications. There are no diabetic complications. Current diabetic treatment includes oral agent (dual therapy) and oral agent (monotherapy). He is following a generally healthy diet. When asked about meal planning, he reported none. He has not had a previous visit with a dietitian. (Blood sugar 165-300, then goes down at night) An ACE inhibitor/angiotensin II receptor blocker is being taken. He does not see a podiatrist.Eye exam is not current.  Insomnia Primary symptoms: sleep disturbance, no difficulty falling asleep, frequent awakening, malaise/fatigue.    The onset quality is sudden. The symptoms are aggravated by emotional upset and alcohol (beer). Past treatments include medication. PMH includes: hypertension, no apnea.      Past Medical History:  Diagnosis Date   Chronic kidney disease    stage 2   Diabetes mellitus without complication (HCC)    Hypertension    Malaise and fatigue      Family History  Problem Relation Age of Onset   Hypertension Mother    Hypothyroidism Mother    Alzheimer's disease Father    Prostate cancer Father    Prostate cancer Brother      Current Outpatient Medications:    ACCU-CHEK GUIDE test strip, Use as instructed to check blood sugars 1 time per day, Disp: 50 strip, Rfl: 11   albuterol (VENTOLIN HFA) 108 (90 Base) MCG/ACT inhaler, Inhale 2 puffs into the lungs every 6 (six) hours as needed for wheezing or shortness of breath., Disp: 8 g, Rfl: 2   amLODipine (NORVASC) 5 MG tablet, Take 1 tablet (5 mg total) by mouth daily., Disp: 90 tablet, Rfl: 1   atorvastatin (LIPITOR) 80 MG tablet, Take 1 tablet (80 mg total) by mouth daily., Disp: 90 tablet, Rfl: 3   Blood Glucose Monitoring Suppl (ONE TOUCH ULTRA 2) w/Device KIT, Use as directed to check blood sugars 1 time per day dx: e11.65, Disp: 1 kit, Rfl: 1   dapagliflozin propanediol (FARXIGA) 10 MG TABS tablet, Take 1 tablet (10 mg total) by mouth daily., Disp: 90 tablet, Rfl: 3   sildenafil (REVATIO) 20 MG tablet, Take 1 tablet (20 mg total) by mouth as needed.  MAX 3 TO 5 TABLETS PER DAY, Disp: 60 tablet, Rfl: 1   sitaGLIPtin (JANUVIA) 100 MG tablet, Take 1 tablet (100 mg total) by mouth daily., Disp: 90 tablet, Rfl: 2   tamsulosin (FLOMAX) 0.4 MG CAPS capsule, Take 1 capsule (0.4 mg total) by mouth daily after breakfast., Disp: 30 capsule, Rfl: 2   tiZANidine (ZANAFLEX) 4 MG tablet, Take 1 tablet (4 mg total) by mouth daily., Disp: 30 tablet, Rfl: 1   traZODone (DESYREL) 50 MG tablet, Take 1 tablet (50 mg total) by mouth at bedtime., Disp: 30 tablet,  Rfl: 2   Semaglutide (RYBELSUS) 7 MG TABS, Take 1 tablet (7 mg total) by mouth daily. Take 30 minutes before breakfast, Disp: 30 tablet, Rfl: 2  Current Facility-Administered Medications:    cefTRIAXone (ROCEPHIN) injection 1 g, 1 g, Intramuscular, Once, Warren Felts, [name]   triamcinolone acetonide (KENALOG-40) injection 40 mg, 40 mg, Intramuscular, Once, Warren Felts, [name]   No Known Allergies   Review of Systems  Constitutional:  Positive for malaise/fatigue.  Respiratory: Negative.  Negative for apnea.   Cardiovascular: Negative.   Gastrointestinal: Negative.   Endocrine: Negative for polydipsia, polyphagia and polyuria.  Neurological: Negative.   Psychiatric/Behavioral:  Positive for sleep disturbance. The patient has insomnia. The patient is not nervous/anxious.      Today's Vitals   04/02/23 1024  BP: 122/60  Pulse: 72  Temp: 97.9 F (36.6 C)  TempSrc: Oral  Weight: 201 lb (91.2 kg)  Height: 5\' 7"  (1.702 m)  PainSc: 0-No pain   Body mass index is 31.48 kg/m.  Wt Readings from Last 3 Encounters:  04/02/23 201 lb (91.2 kg)  04/02/23 201 lb 9.6 oz (91.4 kg)  11/26/22 202 lb (91.6 kg)      Objective:  Physical Exam Vitals reviewed.  Constitutional:      General: He is not in acute distress.    Appearance: Normal appearance.  HENT:     Head: Normocephalic.  Cardiovascular:     Rate and Rhythm: Normal rate and regular rhythm.     Pulses: Normal pulses.     Heart sounds: Normal heart sounds. No murmur heard. Pulmonary:     Effort: Pulmonary effort is normal. No respiratory distress.     Breath sounds: Normal breath sounds. No stridor. No wheezing or rhonchi.  Chest:     Chest wall: No tenderness.  Musculoskeletal:        General: Normal range of motion.  Skin:    General: Skin is warm and dry.     Capillary Refill: Capillary refill takes less than 2 seconds.  Neurological:     General: No focal deficit present.     Mental Status: He is alert and oriented  to person, place, and time.     Cranial Nerves: No cranial nerve deficit.     Motor: No weakness.  Psychiatric:        Mood and Affect: Mood normal.        Behavior: Behavior normal.        Thought Content: Thought content normal.        Judgment: Judgment normal.         Assessment And Plan:  Type 2 diabetes mellitus with stage 2 chronic kidney disease, without long-term current use of insulin (HCC) Assessment & Plan: HgbA1c is poorly controlled increased at last visit. He is now taking his medications more regularly. I will add Rybelsus for better effectiveness, denies family history of thyroid medullary cancer or  personal history of pancreatitis. Discussed side effects. Will recheck levels today as well.  Orders: -     Rybelsus; Take 1 tablet (7 mg total) by mouth daily. Take 30 minutes before breakfast  Dispense: 30 tablet; Refill: 2 -     Ambulatory referral to Ophthalmology -     BMP8+eGFR -     Hemoglobin A1c  Mixed hyperlipidemia Assessment & Plan: Cholesterol levels are elevated, continue statin, tolerating well.   Orders: -     Lipid panel -     BMP8+eGFR  Hypertensive nephropathy Assessment & Plan: Blood pressure is well controlled, continue current medications.    Class 1 obesity due to excess calories with serious comorbidity and body mass index (BMI) of 31.0 to 31.9 in adult Assessment & Plan: he is encouraged to strive for BMI less than 30 to decrease cardiac risk. Advised to aim for at least 150 minutes of exercise per week.   Orders: -     BMP8+eGFR  Adjustment insomnia Assessment & Plan: Has tried over the counter medications will do trial trazadone.   Orders: -     traZODone HCl; Take 1 tablet (50 mg total) by mouth at bedtime.  Dispense: 30 tablet; Refill: 2  Grief Assessment & Plan: Mother has recently passed overall he is doing well other than not sleeping as well.    Other long term (current) drug therapy -     CBC    Return for  controlled DM check 4 months.  Patient was given opportunity to ask questions. Patient verbalized understanding of the plan and was able to repeat key elements of the plan. All questions were answered to their satisfaction.     Jeanell Sparrow, [name], have reviewed all documentation for this visit. The documentation on 04/02/23 for the exam, diagnosis, procedures, and orders are all accurate and complete.  IF YOU HAVE BEEN REFERRED TO A SPECIALIST, IT MAY TAKE 1-2 WEEKS TO SCHEDULE/PROCESS THE REFERRAL. IF YOU HAVE NOT HEARD FROM US/SPECIALIST IN TWO WEEKS, PLEASE GIVE [place] A CALL AT [phone].

## 2023-04-03 LAB — BMP8+EGFR
BUN/Creatinine Ratio: 9 — ABNORMAL LOW (ref 10–24)
BUN: 11 mg/dL (ref 8–27)
CO2: 22 mmol/L (ref 20–29)
Calcium: 9.9 mg/dL (ref 8.6–10.2)
Chloride: 106 mmol/L (ref 96–106)
Creatinine, Ser: 1.28 mg/dL — ABNORMAL HIGH (ref 0.76–1.27)
Glucose: 201 mg/dL — ABNORMAL HIGH (ref 70–99)
Potassium: 5.2 mmol/L (ref 3.5–5.2)
Sodium: 143 mmol/L (ref 134–144)
eGFR: 59 mL/min/{1.73_m2} — ABNORMAL LOW (ref >59)

## 2023-04-03 LAB — CBC
Hematocrit: 46.8 % (ref 37.5–51.0)
Hemoglobin: 16.1 g/dL (ref 13.0–17.7)
MCH: 30.8 pg (ref 26.6–33.0)
MCHC: 34.4 g/dL (ref 31.5–35.7)
MCV: 90 fL (ref 79–97)
Platelets: 158 10*3/uL (ref 150–450)
RBC: 5.23 x10E6/uL (ref 4.14–5.80)
RDW: 13.1 % (ref 11.6–15.4)
WBC: 7.6 10*3/uL (ref 3.4–10.8)

## 2023-04-03 LAB — HEMOGLOBIN A1C
Est. average glucose Bld gHb Est-mCnc: 252 mg/dL
Hgb A1c MFr Bld: 10.4 % — ABNORMAL HIGH (ref 4.8–5.6)

## 2023-04-03 LAB — LIPID PANEL
Chol/HDL Ratio: 2.3 ratio (ref 0.0–5.0)
Cholesterol, Total: 169 mg/dL (ref 100–199)
HDL: 75 mg/dL (ref >39)
LDL Chol Calc (NIH): 72 mg/dL (ref 0–99)
Triglycerides: 130 mg/dL (ref 0–149)
VLDL Cholesterol Cal: 22 mg/dL (ref 5–40)

## 2023-04-10 NOTE — Assessment & Plan Note (Addendum)
Mother has recently passed overall he is doing well other than not sleeping as well.

## 2023-04-10 NOTE — Assessment & Plan Note (Signed)
Blood pressure is well controlled, continue current medications.

## 2023-04-10 NOTE — Assessment & Plan Note (Signed)
Cholesterol levels are elevated, continue statin, tolerating well.

## 2023-04-10 NOTE — Assessment & Plan Note (Signed)
HgbA1c is poorly controlled increased at last visit. He is now taking his medications more regularly. I will add Rybelsus for better effectiveness, denies family history of thyroid medullary cancer or personal history of pancreatitis. Discussed side effects. Will recheck levels today as well.

## 2023-04-10 NOTE — Assessment & Plan Note (Signed)
Has tried over the counter medications will do trial trazadone.

## 2023-04-10 NOTE — Assessment & Plan Note (Signed)
he is encouraged to strive for BMI less than 30 to decrease cardiac risk. Advised to aim for at least 150 minutes of exercise per week.

## 2023-05-06 ENCOUNTER — Other Ambulatory Visit: Payer: Self-pay | Admitting: Internal Medicine

## 2023-05-21 ENCOUNTER — Other Ambulatory Visit: Payer: Self-pay | Admitting: Nurse Practitioner

## 2023-05-21 DIAGNOSIS — R3911 Hesitancy of micturition: Secondary | ICD-10-CM

## 2023-06-01 ENCOUNTER — Telehealth: Payer: Self-pay

## 2023-06-01 IMAGING — CR DG CHEST 2V
3 series · 3 of 3 positions shown · non-contrast
Comparison: December 24, 2021

CLINICAL DATA: Follow-up pneumonia.  Productive cough.

EXAM:
CHEST - 2 VIEW

[w chest pa (1 of 2)]
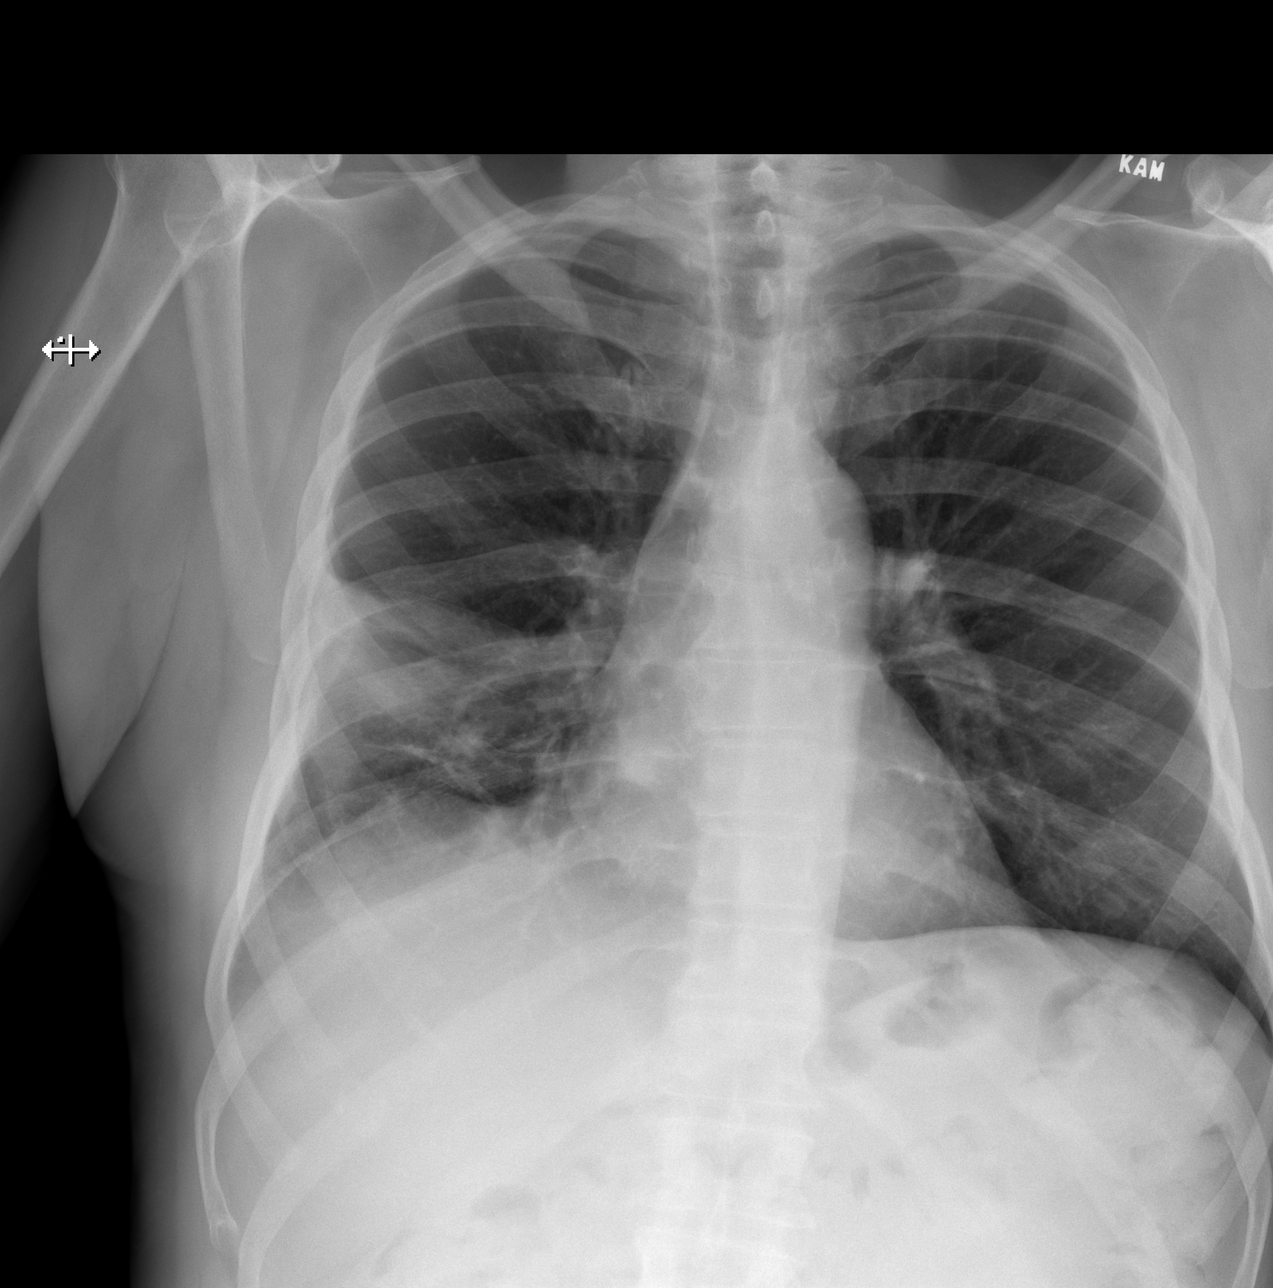

[w chest pa (2 of 2)]
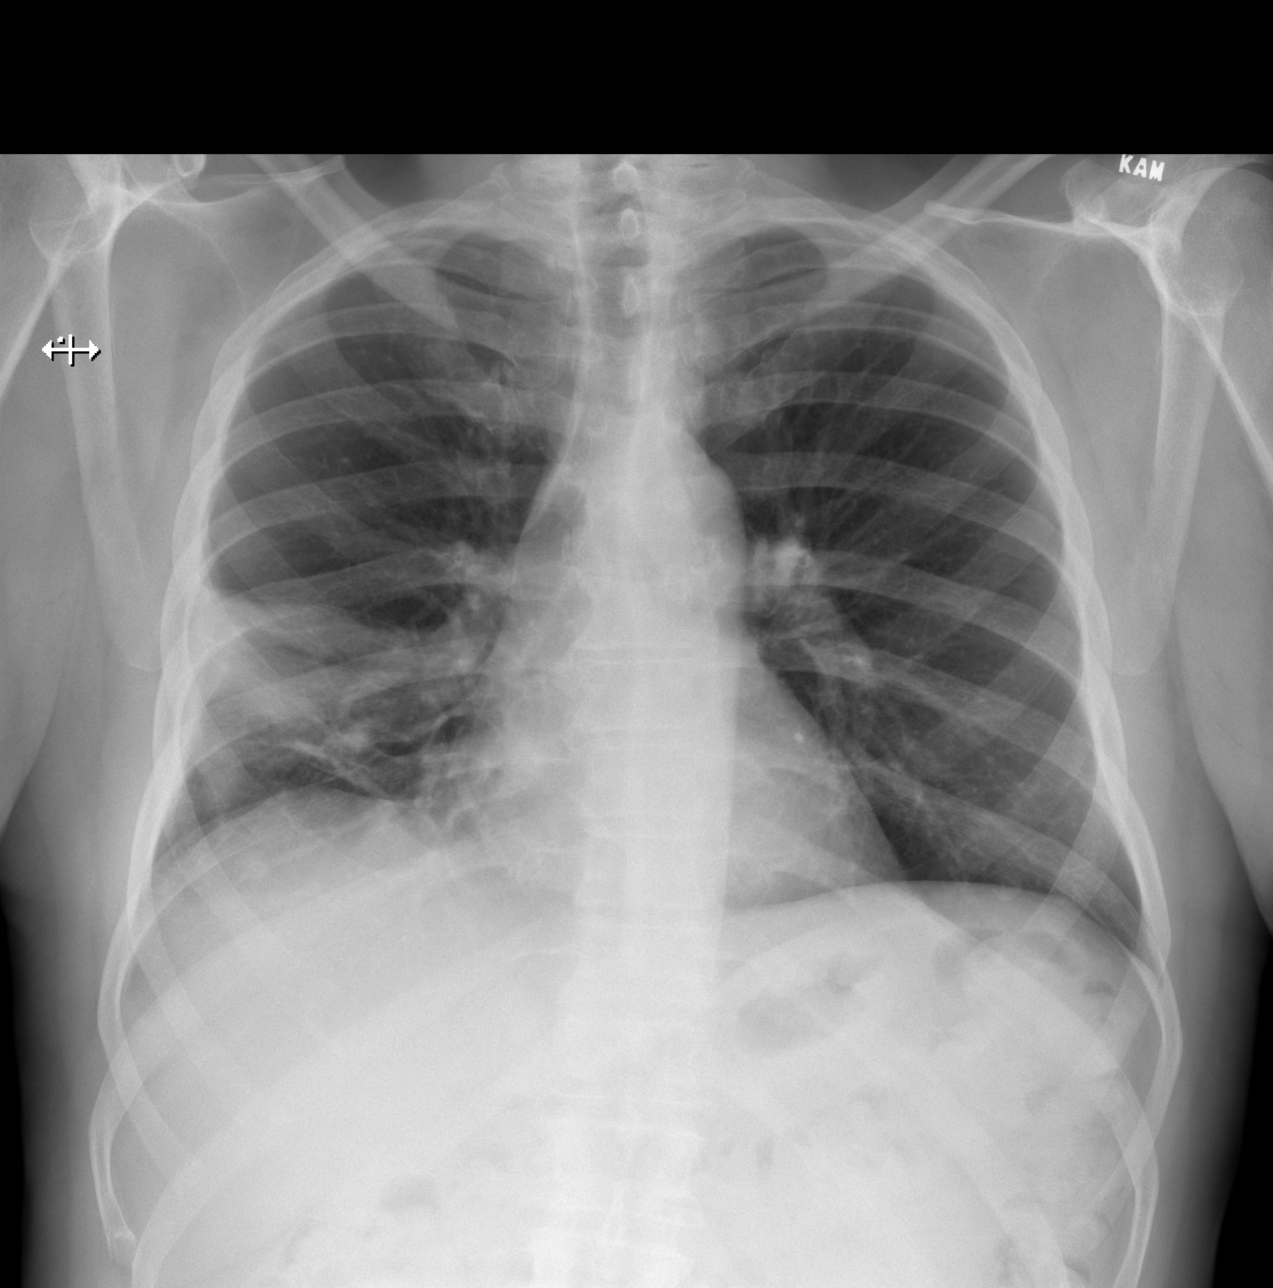

[w chest lat]
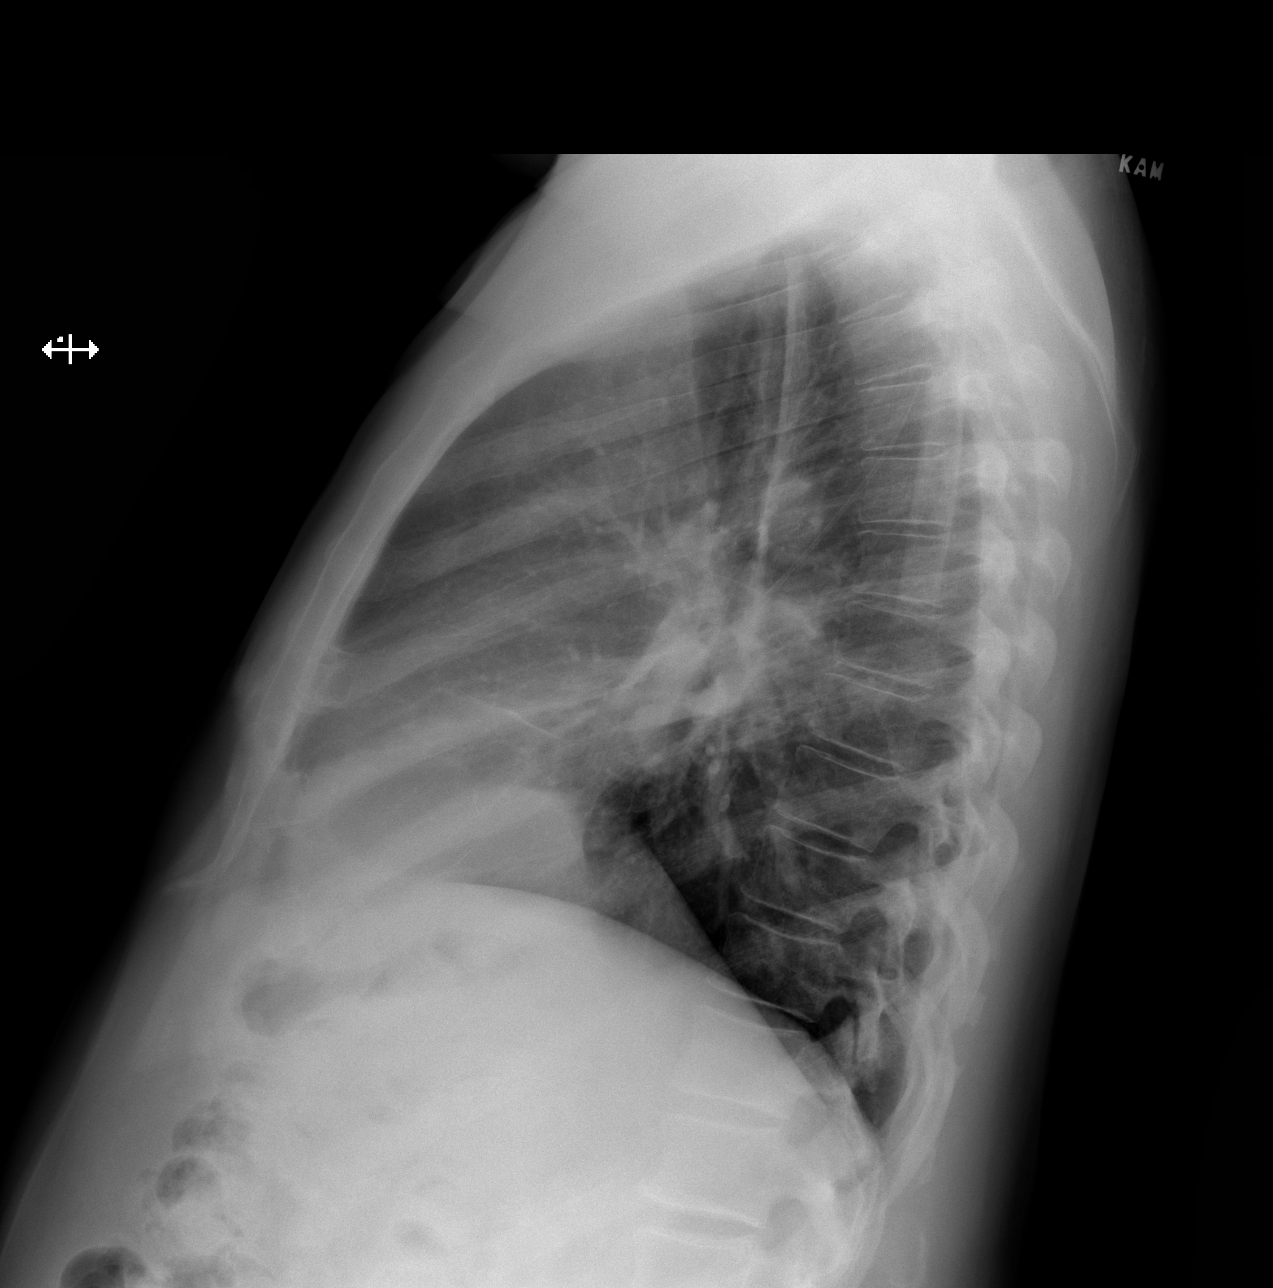

[3 of 3 positions shown; findings below may reference images not displayed]

FINDINGS: The right middle lobe opacity is similar in the interval. The heart,
hila, mediastinum, lungs, and pleura are otherwise unremarkable.
IMPRESSION: No significant improvement in the right middle lobe opacity.
Recommend a CT scan of the chest for better evaluation.

These results will be called to the ordering clinician or
representative by the Radiologist Assistant, and communication
documented in the PACS or [REDACTED].

## 2023-06-01 NOTE — Telephone Encounter (Signed)
Pt is currently receiving pt assistance through [employer] (for [employer]) and [employer] (for [employer]). Pt would also possibly be eligible for Rybelsus through [employer]

## 2023-06-26 ENCOUNTER — Other Ambulatory Visit: Payer: Self-pay | Admitting: Nurse Practitioner

## 2023-06-26 DIAGNOSIS — F5102 Adjustment insomnia: Secondary | ICD-10-CM

## 2023-06-26 DIAGNOSIS — E1122 Type 2 diabetes mellitus with diabetic chronic kidney disease: Secondary | ICD-10-CM

## 2023-07-15 ENCOUNTER — Ambulatory Visit: Payer: Medicare HMO | Admitting: Nurse Practitioner

## 2023-07-15 ENCOUNTER — Encounter: Payer: Self-pay | Admitting: Nurse Practitioner

## 2023-07-15 VITALS — BP 124/64 | HR 78 | Temp 97.9°F | Ht 67.0 in | Wt 207.0 lb

## 2023-07-15 DIAGNOSIS — E782 Mixed hyperlipidemia: Secondary | ICD-10-CM | POA: Diagnosis not present

## 2023-07-15 DIAGNOSIS — E6609 Other obesity due to excess calories: Secondary | ICD-10-CM

## 2023-07-15 DIAGNOSIS — N182 Chronic kidney disease, stage 2 (mild): Secondary | ICD-10-CM | POA: Diagnosis not present

## 2023-07-15 DIAGNOSIS — E1122 Type 2 diabetes mellitus with diabetic chronic kidney disease: Secondary | ICD-10-CM

## 2023-07-15 DIAGNOSIS — Z23 Encounter for immunization: Secondary | ICD-10-CM | POA: Diagnosis not present

## 2023-07-15 DIAGNOSIS — E66811 Obesity, class 1: Secondary | ICD-10-CM

## 2023-07-15 DIAGNOSIS — G8929 Other chronic pain: Secondary | ICD-10-CM | POA: Diagnosis not present

## 2023-07-15 DIAGNOSIS — I129 Hypertensive chronic kidney disease with stage 1 through stage 4 chronic kidney disease, or unspecified chronic kidney disease: Secondary | ICD-10-CM

## 2023-07-15 DIAGNOSIS — M25511 Pain in right shoulder: Secondary | ICD-10-CM

## 2023-07-15 DIAGNOSIS — I10 Essential (primary) hypertension: Secondary | ICD-10-CM

## 2023-07-15 DIAGNOSIS — Z6832 Body mass index (BMI) 32.0-32.9, adult: Secondary | ICD-10-CM

## 2023-07-15 DIAGNOSIS — Z2821 Immunization not carried out because of patient refusal: Secondary | ICD-10-CM | POA: Insufficient documentation

## 2023-07-15 HISTORY — DX: Other obesity due to excess calories: E66.09

## 2023-07-15 NOTE — Assessment & Plan Note (Signed)
Covid 19 vaccine given in office observed for 15 minutes without any adverse reaction He is advised he can have his RSV vaccine done at the local pharmacy

## 2023-07-15 NOTE — Assessment & Plan Note (Signed)
Cholesterol levels were normal at last visit, continue statin.

## 2023-07-15 NOTE — Assessment & Plan Note (Signed)
Blood pressure is well controlled, continue current medications.

## 2023-07-15 NOTE — Assessment & Plan Note (Signed)
Has range of motion at this time. Encouraged to continue with pain cream. He does not want an Xray or referral to ortho at this time.

## 2023-07-15 NOTE — Assessment & Plan Note (Addendum)
HgbA1c was slightly improved at last visit, he is not taking Rybelsus due to concerns about side effects, I have placed an order for pharmacy to discuss disease management.

## 2023-07-15 NOTE — Progress Notes (Addendum)
[name], CMA,acting as a Neurosurgeon for [name], [name].,have documented all relevant documentation on the behalf of [name], [name],as directed by  [name], [name] while in the presence of [name], [name].  Subjective:  Patient ID: Warren [name] , male    DOB: 06/04/1946 , 77 y.o.   MRN: [id number]  Chief Complaint  Patient presents with   Hypertension   Diabetes    HPI  Patient presents today for a bp and dm follow up, Patient reports compliance with medication. He is not taking Rybelsus due to the the side effects. Patient denies any chest pain, SOB, or headaches. Patient has no concerns today. He does admit that with the holidays he over indulged.   Diabetes He presents for his follow-up diabetic visit. He has type 2 diabetes mellitus. There are no hypoglycemic associated symptoms. Pertinent negatives for diabetes include no polydipsia, no polyphagia and no polyuria. There are no hypoglycemic complications. Diabetic complications include nephropathy. Risk factors for coronary artery disease include hypertension, sedentary lifestyle, obesity, dyslipidemia and diabetes mellitus. Current diabetic treatment includes oral agent (dual therapy) and oral agent (monotherapy). He is following a generally healthy diet. When asked about meal planning, he reported none. He has not had a previous visit with a dietitian. He rarely participates in exercise. (Blood sugar this morning was 219.) An ACE inhibitor/angiotensin II receptor blocker is being taken. He does not see a podiatrist.Eye exam is not current.     Past Medical History:  Diagnosis Date   AKI (acute kidney injury) (HCC) 12/24/2021   Chronic kidney disease    stage 2   Diabetes mellitus without complication (HCC)    Hypertension    Malaise and fatigue    RLL pneumonia 12/24/2021   Sepsis (HCC) 12/24/2021     Family History  Problem Relation Age of Onset   Hypertension Mother    Hypothyroidism Mother    Alzheimer's  disease Father    Prostate cancer Father    Prostate cancer Brother      Current Outpatient Medications:    ACCU-CHEK GUIDE test strip, Use as instructed to check blood sugars 1 time per day, Disp: 50 strip, Rfl: 11   amLODipine (NORVASC) 5 MG tablet, Take 1 tablet (5 mg total) by mouth daily., Disp: 90 tablet, Rfl: 1   atorvastatin (LIPITOR) 80 MG tablet, Take 1 tablet (80 mg total) by mouth daily., Disp: 90 tablet, Rfl: 3   Blood Glucose Monitoring Suppl (ONE TOUCH ULTRA 2) w/Device KIT, Use as directed to check blood sugars 1 time per day dx: e11.65, Disp: 1 kit, Rfl: 1   dapagliflozin propanediol (FARXIGA) 10 MG TABS tablet, Take 1 tablet (10 mg total) by mouth daily., Disp: 90 tablet, Rfl: 3   JANUVIA 100 MG tablet, Take 1 tablet (100 mg total) by mouth daily., Disp: 90 tablet, Rfl: 2   sildenafil (REVATIO) 20 MG tablet, Take 1 tablet (20 mg total) by mouth as needed. MAX 3 TO 5 TABLETS PER DAY, Disp: 60 tablet, Rfl: 1   tamsulosin (FLOMAX) 0.4 MG CAPS capsule, TAKE ONE CAPSULE BY MOUTH EVERY MORNING AFTER breakfast, Disp: 30 capsule, Rfl: 2   albuterol (VENTOLIN HFA) 108 (90 Base) MCG/ACT inhaler, Inhale 2 puffs into the lungs every 6 (six) hours as needed for wheezing or shortness of breath. (Patient not taking: Reported on 07/15/2023), Disp: 8 g, Rfl: 2   tiZANidine (ZANAFLEX) 4 MG tablet, Take 1 tablet (4 mg total) by mouth daily. (Patient not  taking: Reported on 07/15/2023), Disp: 30 tablet, Rfl: 1  Current Facility-Administered Medications:    cefTRIAXone (ROCEPHIN) injection 1 g, 1 g, Intramuscular, Once, [name], [name]   triamcinolone acetonide (KENALOG-40) injection 40 mg, 40 mg, Intramuscular, Once, [name], [name]   No Known Allergies   Review of Systems  Constitutional: Negative.   HENT: Negative.    Eyes: Negative.   Respiratory: Negative.    Cardiovascular: Negative.   Gastrointestinal: Negative.   Endocrine: Negative for polydipsia, polyphagia and polyuria.   Musculoskeletal:  Positive for arthralgias (right shoulder, uses a pain cream, helped for a short period of time.).  Psychiatric/Behavioral:  The patient has insomnia.      Today's Vitals   07/15/23 0943  BP: 124/64  Pulse: 78  Temp: 97.9 F (36.6 C)  TempSrc: Oral  Weight: 207 lb (93.9 kg)  Height: 5\' 7"  (1.702 m)  PainSc: 0-No pain   Body mass index is 32.42 kg/m.  Wt Readings from Last 3 Encounters:  07/15/23 207 lb (93.9 kg)  04/02/23 201 lb (91.2 kg)  04/02/23 201 lb 9.6 oz (91.4 kg)     Objective:  Physical Exam Vitals reviewed.  Constitutional:      General: He is not in acute distress.    Appearance: Normal appearance. He is obese.  HENT:     Head: Normocephalic.  Cardiovascular:     Rate and Rhythm: Normal rate and regular rhythm.     Pulses: Normal pulses.     Heart sounds: Normal heart sounds. No murmur heard. Pulmonary:     Effort: Pulmonary effort is normal. No respiratory distress.     Breath sounds: Normal breath sounds. No stridor. No wheezing or rhonchi.  Chest:     Chest wall: No tenderness.  Musculoskeletal:        General: No swelling or tenderness. Normal range of motion.  Skin:    General: Skin is warm and dry.     Capillary Refill: Capillary refill takes less than 2 seconds.  Neurological:     General: No focal deficit present.     Mental Status: He is alert and oriented to person, place, and time.     Cranial Nerves: No cranial nerve deficit.     Motor: No weakness.  Psychiatric:        Mood and Affect: Mood normal.        Behavior: Behavior normal.        Thought Content: Thought content normal.        Judgment: Judgment normal.         Assessment And Plan:  Type 2 diabetes mellitus with stage 2 chronic kidney disease, without long-term current use of insulin (HCC) Assessment & Plan: HgbA1c was slightly improved at last visit, he is not taking Rybelsus due to concerns about side effects, I have placed an order for pharmacy to  discuss disease management.   Orders: -     Hemoglobin A1c -     AMB Referral [facility] Care Management  Mixed hyperlipidemia Assessment & Plan: Cholesterol levels were normal at last visit, continue statin.   Orders: -     Lipid panel  Hypertensive nephropathy Assessment & Plan: Blood pressure is well controlled, continue current medications  Orders: -     Basic metabolic panel  Chronic right shoulder pain Assessment & Plan: Has range of motion at this time. Encouraged to continue with pain cream. He does not want an Xray or referral to ortho at this time.  COVID-19 vaccine administered Assessment & Plan: Covid 19 vaccine given in office observed for 15 minutes without any adverse reaction He is advised he can have his RSV vaccine done at the local pharmacy  Orders: -     Pfizer Comirnaty Covid-19 Vaccine 59yrs & older  Class 1 obesity due to excess calories with body mass index (BMI) of 32.0 to 32.9 in adult, unspecified whether serious comorbidity present Assessment & Plan: he is encouraged to strive for BMI less than 30 to decrease cardiac risk. Advised to aim for at least 150 minutes of exercise per week.     No follow-ups on file.  Patient was given opportunity to ask questions. Patient verbalized understanding of the plan and was able to repeat key elements of the plan. All questions were answered to their satisfaction.    Jeanell Sparrow, [name], have reviewed all documentation for this visit. The documentation on 07/21/23 for the exam, diagnosis, procedures, and orders are all accurate and complete.   IF YOU HAVE BEEN REFERRED TO A SPECIALIST, IT MAY TAKE 1-2 WEEKS TO SCHEDULE/PROCESS THE REFERRAL. IF YOU HAVE NOT HEARD FROM US/SPECIALIST IN TWO WEEKS, PLEASE GIVE [place] A CALL AT [phone].

## 2023-07-15 NOTE — Assessment & Plan Note (Signed)
he is encouraged to strive for BMI less than 30 to decrease cardiac risk. Advised to aim for at least 150 minutes of exercise per week.

## 2023-07-15 NOTE — Patient Instructions (Signed)
Let us know if you want a referral to ortho if in the next month If not you can go to [facility] or [facility]

## 2023-07-16 ENCOUNTER — Telehealth: Payer: Self-pay

## 2023-07-16 LAB — LIPID PANEL
Chol/HDL Ratio: 2.6 {ratio} (ref 0.0–5.0)
Cholesterol, Total: 192 mg/dL (ref 100–199)
HDL: 75 mg/dL (ref >39)
LDL Chol Calc (NIH): 93 mg/dL (ref 0–99)
Triglycerides: 138 mg/dL (ref 0–149)
VLDL Cholesterol Cal: 24 mg/dL (ref 5–40)

## 2023-07-16 LAB — BASIC METABOLIC PANEL
BUN/Creatinine Ratio: 9 — ABNORMAL LOW (ref 10–24)
BUN: 13 mg/dL (ref 8–27)
CO2: 24 mmol/L (ref 20–29)
Calcium: 9.9 mg/dL (ref 8.6–10.2)
Chloride: 104 mmol/L (ref 96–106)
Creatinine, Ser: 1.38 mg/dL — ABNORMAL HIGH (ref 0.76–1.27)
Glucose: 257 mg/dL — ABNORMAL HIGH (ref 70–99)
Potassium: 4.4 mmol/L (ref 3.5–5.2)
Sodium: 143 mmol/L (ref 134–144)
eGFR: 53 mL/min/{1.73_m2} — ABNORMAL LOW (ref >59)

## 2023-07-16 LAB — HEMOGLOBIN A1C
Est. average glucose Bld gHb Est-mCnc: 249 mg/dL
Hgb A1c MFr Bld: 10.3 % — ABNORMAL HIGH (ref 4.8–5.6)

## 2023-07-16 NOTE — Progress Notes (Signed)
   Care Guide Note  07/16/2023 Name: [name] MRN: [id number] DOB: 08/13/1945  Referred by: [name], [name] Reason for referral : Care Coordination (Outreach to schedule with Pharm d )   Warren [name] is a 77 y.o. year old male who is a primary care patient of [name], [name]. [name] was referred to the pharmacist for assistance related to DM.    Successful contact was made with the patient to discuss pharmacy services including being ready for the pharmacist to call at least 5 minutes before the scheduled appointment time, to have medication bottles and any blood sugar or blood pressure readings ready for review. The patient agreed to meet with the pharmacist via with the pharmacist via telephone visit on (date/time).  08/24/2023  Penne Lash , RMA     [place]  [facility], [facility] Guide  Direct Dial: [phone]  Website: [email or web address]

## 2023-08-19 ENCOUNTER — Other Ambulatory Visit: Payer: Self-pay | Admitting: Nurse Practitioner

## 2023-08-24 ENCOUNTER — Other Ambulatory Visit: Payer: Self-pay | Admitting: Pharmacist

## 2023-08-24 MED ORDER — METFORMIN HCL ER 500 MG PO TB24: 500.0000 mg | ORAL_TABLET | Freq: Two times a day (BID) | ORAL | 1 refills | Status: DC

## 2023-08-24 NOTE — Progress Notes (Signed)
 08/24/2023 Name: Warren [name] MRN: [id number] DOB: 03-Apr-1946  Chief Complaint  Patient presents with   Diabetes   Medication Management   Hypertension    Warren [name] is a 78 y.o. year old male who presented for a telephone visit.   They were referred to the pharmacist by their PCP for assistance in managing diabetes, hypertension, and hyperlipidemia.    Subjective:  Care Team: Primary Care Provider: Georgina Speaks, [name] ; Next Scheduled Visit: 11/17/23  Medication Access/Adherence  Current Pharmacy:  [facility] - [place], [place] - [address] [place] [address] Phone: [phone] Fax: [phone]  [facility] #[id number] - [place], [place] - [address] AT [facility] OF [address] & [facility] [address] [place] [address] Phone: [phone] Fax: [phone]   Patient reports affordability concerns with their medications: No  Patient reports access/transportation concerns to their pharmacy: No  Patient reports adherence concerns with their medications:  No     Diabetes:  Current medications: Januvia  100 mg daily, Farxiga  10 mg daily  Medications tried in the past: Rybelsus  - notes he started with samples, unclear what dose; notes he had stomach upset with this medication and does not want to retry.   Current glucose readings: fasting this morning: 220; reports readings generally in 200s;   Patient denies hypoglycemic s/sx including dizziness, shakiness, sweating. Patient denies hyperglycemic symptoms including polyuria, polydipsia, polyphagia, nocturia, neuropathy, blurred vision.  Current meal patterns: reports he needs to work on portion sizes  - Breakfast: cereal (special K); oatmeal; sometimes 2 scrambled eggs, sometimes turkey bacon; orange juice - Lunch: burger, sometimes chicken - KFT;  - Supper: salad; green peppers, onions, carrots, tomatoes, cucumbers, sliced ham; oil  - Snacks; not a lot of  sweets besides ice cream;  - Drinks: sweet tea, but notes he is drinking more water than he used to ; avoids soda  Current medication access support: needs to reapply for Farxiga  assistance  Hypertension:  Current medications: amlodipine  5 mg daily   Hyperlipidemia/ASCVD Risk Reduction  Current lipid lowering medications: atorvastatin  80 mg daily    Objective:  Lab Results  Component Value Date   HGBA1C 10.3 (H) 07/15/2023    Lab Results  Component Value Date   CREATININE 1.38 (H) 07/15/2023   BUN 13 07/15/2023   NA 143 07/15/2023   K 4.4 07/15/2023   CL 104 07/15/2023   CO2 24 07/15/2023    Lab Results  Component Value Date   CHOL 192 07/15/2023   HDL 75 07/15/2023   LDLCALC 93 07/15/2023   TRIG 138 07/15/2023   CHOLHDL 2.6 07/15/2023    Medications Reviewed Today     Reviewed by Rudy Dorothyann ONEIDA, RPH-CPP (Pharmacist) on 08/24/23 at 1019  Med List Status: <None>   Medication Order Taking? Sig Documenting Provider Last Dose Status Informant  ACCU-CHEK GUIDE test strip [id number]  Use as instructed to check blood sugars 1 time per day Georgina Speaks, [name]  Active   albuterol  (VENTOLIN  HFA) 108 (90 Base) MCG/ACT inhaler [id number]  Inhale 2 puffs into the lungs every 6 (six) hours as needed for wheezing or shortness of breath.  Patient not taking: Reported on 07/15/2023   Jarold Medici, MD  Active   amLODipine  (NORVASC ) 5 MG tablet [id number]  Take 1 tablet (5 mg total) by mouth daily. Jarold Medici, MD  Active   atorvastatin  (LIPITOR ) 80 MG tablet [id number]  Take 1 tablet (  80 mg total) by mouth daily. Georgina Speaks, [name]  Active   Blood Glucose Monitoring Suppl (ONE TOUCH ULTRA 2) w/Device KIT [id number]  Use as directed to check blood sugars 1 time per day dx: e11.65 Jarold Medici, MD  Active Self  cefTRIAXone  (ROCEPHIN ) injection 1 g [id number]   Georgina Speaks, [name]  Active   dapagliflozin  propanediol (FARXIGA ) 10 MG TABS tablet [id number]  Take 1 tablet (10 mg total)  by mouth daily. Jarold Medici, MD  Active   JANUVIA  100 MG tablet [id number] Yes Take 1 tablet (100 mg total) by mouth daily. Georgina Speaks, [name] Taking Active   sildenafil  (REVATIO ) 20 MG tablet [id number]  Take 1 tablet (20 mg total) by mouth as needed. MAX 3 TO 5 TABLETS PER DAY Georgina Speaks, [name]  Active   tamsulosin  (FLOMAX ) 0.4 MG CAPS capsule [id number]  TAKE ONE CAPSULE BY MOUTH EVERY MORNING AFTER breakfast Georgina Speaks, [name]  Active   tiZANidine  (ZANAFLEX ) 4 MG tablet [id number]  Take 1 tablet (4 mg total) by mouth daily.  Patient not taking: Reported on 07/15/2023   Georgina Speaks, [name]  Active   triamcinolone  acetonide (KENALOG -40) injection 40 mg [id number]   Georgina Speaks, [name]  Active               Assessment/Plan:   Diabetes: - Currently uncontrolled - Reviewed long term cardiovascular and renal outcomes of uncontrolled blood sugar - Reviewed goal A1c, goal fasting, and goal 2 hour post prandial glucose - Reviewed dietary modifications including: extensive dietary education regarding carbohydrate portion sizes; minimizing sugary beverage portion sizes. - Recommend to start metformin  XR 500 mg twice daily. Continue Januvia  100 mg daily and Farxiga  10 mg daily. Opportunity for improvement in blood sugars with dietary modifications as above rather than adding basal insulin  at this time, as patient did not tolerate GLP1.   Hypertension: - Currently controlled - Recommend to continue current regimen at this time   Hyperlipidemia/ASCVD Risk Reduction: - Currently uncontrolled.  - Recommend to continue current regimen. Recheck lipids with next visit     Follow Up Plan: phone call in 4 weeks  Catie IVAR Centers, PharmD, BCACP, CPP Clinical Pharmacist [facility] [phone]

## 2023-08-24 NOTE — Patient Instructions (Signed)
 Shay,   It was great talking to you today!  Start metformin  XR 500 mg twice daily. Continue Januvia  100 mg daily and Farxiga  10 mg daily.   Check your blood sugars twice daily:  1) Fasting, first thing in the morning before breakfast and  2) 2 hours after your largest meal.   For a goal A1c of less than 7%, goal fasting readings are less than 130 and goal 2 hour after meal readings are less than 180.    Work on :  - decreasing the number of servings of sugary beverages you have per day - 1/2 of your plate should be your vegetables, 1/4 should be your protein, and only 1/4 should be your carbohydrates - fried chicken once weekly!   Please reach out with any questions!  Catie IVAR Centers, PharmD, BCACP, CPP Clinical Pharmacist [facility] [phone]

## 2023-08-27 ENCOUNTER — Telehealth: Payer: Self-pay

## 2023-08-27 NOTE — Telephone Encounter (Signed)
PAP: PAP application for Farxiga, [employer] ([employer]) has been mailed to pt's home address on file. Will fax provider portion of application to provider's office when pt's portion is received.   Please note I have faxed the provider portion to [name] for review

## 2023-08-31 ENCOUNTER — Other Ambulatory Visit: Payer: Self-pay | Admitting: Nurse Practitioner

## 2023-08-31 DIAGNOSIS — R3911 Hesitancy of micturition: Secondary | ICD-10-CM

## 2023-09-22 ENCOUNTER — Telehealth: Payer: Self-pay | Admitting: Pharmacist

## 2023-09-22 DIAGNOSIS — E1122 Type 2 diabetes mellitus with diabetic chronic kidney disease: Secondary | ICD-10-CM

## 2023-09-22 NOTE — Progress Notes (Signed)
   09/22/2023  Patient ID: Warren [name], male   DOB: [birth year], 78 y.o.   MRN: [id number]   Reason for referral: Medication Management  Referral source: Dr. [name], [name]  Reason for call: diabetes follow up  Outreach:  Unsuccessful telephone call attempt #1 to patient.   HIPAA compliant voicemail left requesting a return call  Plan:  -I will make another outreach attempt to patient within 3-4 business days.   Beecher Mcardle, PharmD, BCACP Clinical Pharmacist [phone]

## 2023-09-23 ENCOUNTER — Telehealth: Payer: Self-pay | Admitting: Pharmacist

## 2023-09-23 DIAGNOSIS — E1122 Type 2 diabetes mellitus with diabetic chronic kidney disease: Secondary | ICD-10-CM

## 2023-09-23 NOTE — Progress Notes (Signed)
09/23/2023 Name: Warren [name] MRN: [id number] DOB: June 18, 1946  Chief Complaint  Patient presents with   Medication Management    Diabetes     Warren [name] is a 78 y.o. year old male who presented for a telephone visit.   They were referred to the pharmacist by their PCP for assistance in managing  diabetes.  Patient saw Magdalene Molly, PharmD on 08/24/2023.  Purpose of today's call was to follow up from his visit with Catie. Metformin XR 500 mg 1 tablet twice daily was started.   Subjective:  Care Team: Primary Care Provider: [name], [name] ; Next Scheduled Visit: 10/19/2023  Medication Access/Adherence  Current Pharmacy:  [facility] - [place], [place] - [address] [place] [address] Phone: [phone] Fax: [phone]  [facility] #[id number] [place], [place] - [address] AT [facility] OF [address] & [facility] [address] [place] [address] Phone: [phone] Fax: [phone]   Patient reports affordability concerns with their medications: No  Patient reports access/transportation concerns to their pharmacy: Yes  Patient reports adherence concerns with their medications:  No      Diabetes:  Current medications:  Farxiga 10 mg 1 tablet daily Metformin ER 500 mg 1 tablet twice daily Januvia 100 mg 1 tablet daily ( not taking)  Current glucose readings: 156 mg/dl fasting this morning. Has only been checking once per day. Reported most blood sugars in the mornings are in the 150s.  Patient denies hypoglycemic/hyperglycemic s/sx.  Hypertension: 124/64 from 12/24 note with Janece Moore,[name] Amlodipine 5 mg 1 tablet dailyi   Hyperlipidemia/ASCVD Risk Reduction Atorvastatin 80mg  TG 138 HDL 75 LDL 93   Objective:  Lab Results  Component Value Date   HGBA1C 10.3 (H) 07/15/2023    Lab Results  Component Value Date   CREATININE 1.38 (H) 07/15/2023   BUN 13 07/15/2023   NA 143 07/15/2023   K  4.4 07/15/2023   CL 104 07/15/2023   CO2 24 07/15/2023    Lab Results  Component Value Date   CHOL 192 07/15/2023   HDL 75 07/15/2023   LDLCALC 93 07/15/2023   TRIG 138 07/15/2023   CHOLHDL 2.6 07/15/2023    Medications Reviewed Today     Reviewed by Beecher Mcardle, RPH (Pharmacist) on 09/23/23 at 1029  Med List Status: <None>   Medication Order Taking? Sig Documenting Provider Last Dose Status Informant  ACCU-CHEK GUIDE test strip [id number] Yes Use as instructed to check blood sugars 1 time per day [name], [name] Taking Active   albuterol (VENTOLIN HFA) 108 (90 Base) MCG/ACT inhaler [id number] Yes Inhale 2 puffs into the lungs every 6 (six) hours as needed for wheezing or shortness of breath. Dorothyann Peng, MD Taking Active   amLODipine (NORVASC) 5 MG tablet [id number] Yes Take 1 tablet (5 mg total) by mouth daily. Dorothyann Peng, MD Taking Active   atorvastatin (LIPITOR) 80 MG tablet [id number] Yes Take 1 tablet (80 mg total) by mouth daily. [name], [name] Taking Active   dapagliflozin propanediol (FARXIGA) 10 MG TABS tablet [id number] Yes Take 1 tablet (10 mg total) by mouth daily. Dorothyann Peng, MD Taking Active   JANUVIA 100 MG tablet [id number] No Take 1 tablet (100 mg total) by mouth daily.  Patient not taking: Reported on 09/23/2023   [name], [name] Not Taking Active   metFORMIN (GLUCOPHAGE-XR) 500 MG 24 hr tablet [id number] Yes Take 1 tablet (500  mg total) by mouth 2 (two) times daily with a meal. [name], [name] Taking Active   sildenafil (REVATIO) 20 MG tablet [id number] Yes Take 1 tablet (20 mg total) by mouth as needed. MAX 3 TO 5 TABLETS PER DAY [name], [name] Taking Active   tamsulosin (FLOMAX) 0.4 MG CAPS capsule [id number] Yes TAKE ONE CAPSULE BY MOUTH EVERY MORNING AFTER breakfast [name], [name] Taking Active               Assessment/Plan:   Diabetes: - Currently uncontrolled A1c 10.3% - Reviewed goal A1c, goal fasting, and goal 2 hour post  prandial glucose -- Recommend to Restart Januvia. Patient stopped taking Januvia because he thought that it and [place] were the same.  Catie's note from January said the Patient was to continue Januvia.  He said He would restart it today.    - Recommend to check glucose twice daily (at least once 2 hours after a meal so we can assess his post prandials)  - Meets financial criteria for Januvia ad [place] patient assistance program through [employer] and [employer].He said he received the application for Marcelline Deist but his granddaughter colored all over the application so he would like a new one.    -I will request new applications for both Farxiga and Januvia be sent to the Patient by our Medication Assistance Team.  Follow Up Plan:    Route note to Medication Assistance Team. Call patient back in 2 weeks to see how his blood sugars are doing with Him adding Januvia back into his regimen. There is still some room to optimize the Metformin dose.  Beecher Mcardle, PharmD, BCACP Clinical Pharmacist [phone]

## 2023-09-23 NOTE — Telephone Encounter (Addendum)
PAP: Patient assistance application for Januvia through [employer] has been mailed to pt's home address on file. Provider portion of application will be faxed to provider's office.  I have remailed application for Farxiga to pt.'s home

## 2023-09-28 NOTE — Telephone Encounter (Signed)
 Received provider portion of PAP application

## 2023-09-29 ENCOUNTER — Other Ambulatory Visit: Payer: Self-pay | Admitting: Nurse Practitioner

## 2023-09-29 DIAGNOSIS — F5102 Adjustment insomnia: Secondary | ICD-10-CM

## 2023-10-07 ENCOUNTER — Telehealth: Payer: Self-pay | Admitting: Pharmacist

## 2023-10-07 NOTE — Telephone Encounter (Signed)
 PAP: Application for Marcelline Deist has been submitted to [employer] (AZ[employer]Me), via fax  PAP: Application for Janumet has been submitted to [employer], via fax

## 2023-10-07 NOTE — Progress Notes (Signed)
   10/07/2023  Patient ID: Warren [name], male   DOB: [birth year], 78 y.o.   MRN: [id number]  Patient dropped by the office today and brought in the patient assistance forms for [facility].  He also needed copies of his insurance cards and his benefits award letter from [employer].  The forms were scanned and sent to Tresea Mall, CPhT on the Medication Assistance Team.   Plan: Follow up on the forms in 3-4 weeks.  Beecher Mcardle, PharmD, BCACP Clinical Pharmacist [phone]

## 2023-10-08 NOTE — Telephone Encounter (Signed)
 PAP: Patient assistance application for Marcelline Deist has been approved by PAP Companies: [employer] from 10/07/2023 to 08/10/2024. Medication should be delivered to PAP Delivery: Home. For further shipping updates, please contact [employer] ([employer]) at [phone]. Patient ID is: [id number]

## 2023-10-19 ENCOUNTER — Other Ambulatory Visit: Payer: Self-pay | Admitting: Pharmacist

## 2023-10-19 DIAGNOSIS — E1122 Type 2 diabetes mellitus with diabetic chronic kidney disease: Secondary | ICD-10-CM

## 2023-10-19 NOTE — Progress Notes (Signed)
 10/19/2023 Name: Warren [name] MRN: [id number] DOB: Sep 13, 1945  Chief Complaint  Patient presents with   Medication Management    Diabetes     Warren [name] is a 78 y.o. year old male who presented for a telephone visit.   They were referred to the pharmacist by their PCP for assistance in managing diabetes.    Subjective:  Care Team: Primary Care Provider: [name], [name] ; Next Scheduled Visit: 11/17/2023  Medication Access/Adherence  Current Pharmacy:  [facility] - [place], [place] - [address] [place] [address] Phone: [phone] Fax: [phone]  [facility] #[id number] [place], [place] - [address] AT [facility] [address] [facility] [address] [place] [address] Phone: [phone] Fax: [phone]   Patient reports affordability concerns with their medications: Yes  Marcelline Deist and Januvia He received his attestation form back from [employer] and said they returned the provider's portion.  He took the forms to the office and I instructed him to tell the staff to put the forms on my desk and I will process them when I am in the office on Wednesday. Patient reports access/transportation concerns to their pharmacy: No  Patient reports adherence concerns with their medications:  Yes  Metformin-Patient is only taking 1 tablet daily versus 1 tablet twice daily   Diabetes:  Current medications:  Farxiga 10 mg 1 tablet daily Januvia 100 mg 1 tablet daily Metformin 500 mg 1 tablet twice daily (1 tablet daily)  Current glucose readings: AM/PM/PM  10/19/23  185 10/18/23  202/330/225 10/17/23  176/145/179 10/16/23   153/194/298   Patient denies hypoglycemic s/sx including  dizziness, shakiness, sweating. Patient denies hyperglycemic symptoms including  polyuria, polydipsia, polyphagia, nocturia, neuropathy, blurred vision.  Current meal patterns:  - Breakfast: oatmeal, [place] bacon, grits, cereal -  Lunch varies - Supper salad/fish/cabbage/baked chicken  Current physical activity:  walking and playing with granddaughter    Objective:  Lab Results  Component Value Date   HGBA1C 10.3 (H) 07/15/2023    Lab Results  Component Value Date   CREATININE 1.38 (H) 07/15/2023   BUN 13 07/15/2023   NA 143 07/15/2023   K 4.4 07/15/2023   CL 104 07/15/2023   CO2 24 07/15/2023    Lab Results  Component Value Date   CHOL 192 07/15/2023   HDL 75 07/15/2023   LDLCALC 93 07/15/2023   TRIG 138 07/15/2023   CHOLHDL 2.6 07/15/2023    Medications Reviewed Today     Reviewed by Beecher Mcardle, RPH (Pharmacist) on 10/19/23 at 0915  Med List Status: <None>   Medication Order Taking? Sig Documenting Provider Last Dose Status Informant  ACCU-CHEK GUIDE test strip [id number] Yes Use as instructed to check blood sugars 1 time per day [name], [name] Taking Active   albuterol (VENTOLIN HFA) 108 (90 Base) MCG/ACT inhaler [id number] No Inhale 2 puffs into the lungs every 6 (six) hours as needed for wheezing or shortness of breath.  Patient not taking: Reported on 10/19/2023   Dorothyann Peng, MD Not Taking Active   amLODipine (NORVASC) 5 MG tablet [id number] Yes Take 1 tablet (5 mg total) by mouth daily. Dorothyann Peng, MD Taking Active   atorvastatin (LIPITOR) 80 MG tablet [id number] Yes Take 1 tablet (80 mg total) by mouth daily. [name], [name] Taking Active   dapagliflozin propanediol (FARXIGA) 10 MG TABS tablet [id number] Yes Take 1 tablet (10 mg total) by  mouth daily. Dorothyann Peng, MD Taking Active   JANUVIA 100 MG tablet [id number] Yes Take 1 tablet (100 mg total) by mouth daily. [name], [name] Taking Active   metFORMIN (GLUCOPHAGE-XR) 500 MG 24 hr tablet [id number] Yes Take 1 tablet (500 mg total) by mouth 2 (two) times daily with a meal. [name], [name] Taking Active            Med Note Beecher Mcardle   Mon Oct 19, 2023  9:14 AM) Only taking 1 time daily due to upset stomach   sildenafil (REVATIO) 20 MG tablet [id number] Yes Take 1 tablet (20 mg total) by mouth as needed. MAX 3 TO 5 TABLETS PER DAY [name], [name] Taking Active   tamsulosin (FLOMAX) 0.4 MG CAPS capsule [id number] Yes TAKE ONE CAPSULE BY MOUTH EVERY MORNING AFTER breakfast [name], [name] Taking Active              Assessment/Plan:   Diabetes: - Currently uncontrolled A1c 10.3% - Reviewed long term cardiovascular and renal outcomes of uncontrolled blood sugar - Reviewed goal A1c, goal fasting, and goal 2 hour post prandial glucose - Reviewed dietary modifications including increasing protein intake changing up the breakfast routine (decreasing cereal/oatmeal) - Reviewed lifestyle modifications including:  - Recommend to Take Metformin 500 mg twice daily as prescribed and begin to consider a once daily injection of a basal insulin - Recommend to check glucose 2-3 times per day.  Plan: Call patient back in 2 weeks.  Beecher Mcardle, PharmD, BCACP Clinical Pharmacist [phone]

## 2023-10-19 NOTE — Telephone Encounter (Signed)
 Patient received Attestation letter from [employer] and MD pages that needs a signature and will take to MD office today and will be faxed to [employer]. Will follow up later this week.

## 2023-10-21 ENCOUNTER — Telehealth: Payer: Self-pay | Admitting: Pharmacist

## 2023-10-21 DIAGNOSIS — E1122 Type 2 diabetes mellitus with diabetic chronic kidney disease: Secondary | ICD-10-CM

## 2023-10-21 NOTE — Telephone Encounter (Signed)
 Faxed Missing Signature on APP document and Attestation letter to [employer] for Patient. Will follow up for approval soon.

## 2023-10-21 NOTE — Progress Notes (Signed)
   10/21/2023  Patient ID: Warren [name], male   DOB: 05/25/1946, 78 y.o.   MRN: [id number]  Patient dropped forms by the PCP office.  The forms were evaluated for accuracy and were faxed to [employer], CPhT to complete the patient assistance process.  The provider portion was missing a signature and the patient needed to complete the attestation form.   Beecher Mcardle, PharmD, BCACP Clinical Pharmacist [phone]

## 2023-10-23 ENCOUNTER — Other Ambulatory Visit: Payer: Self-pay | Admitting: Nurse Practitioner

## 2023-10-26 ENCOUNTER — Telehealth: Payer: Self-pay | Admitting: Pharmacist

## 2023-10-26 DIAGNOSIS — E1122 Type 2 diabetes mellitus with diabetic chronic kidney disease: Secondary | ICD-10-CM

## 2023-10-26 NOTE — Telephone Encounter (Signed)
 PAP: Patient assistance application for Januvia has been approved by PAP Companies: [employer] from 10-22-2023 to 10-21-2023. Medication should be delivered to PAP Delivery: Home. For further shipping updates, please contact [employer] at [phone]. Patient ID is: not provided Shipment update- Patient will receive 90 DS on 10-27-2023, mailed to home address.

## 2023-10-27 NOTE — Progress Notes (Signed)
   10/27/2023  Patient ID: Warren [name], male   DOB: 12-24-1945, 78 y.o.   MRN: [id number]  Spoke with Patient. HIPAA identifiers were obtained.  Let Patient know he was approved for [place].  Both medications will be delivered to his home.   Plan:  Follow up in 1 month.  Beecher Mcardle, PharmD, BCACP Clinical Pharmacist [phone]

## 2023-11-02 ENCOUNTER — Other Ambulatory Visit: Payer: Self-pay | Admitting: Internal Medicine

## 2023-11-09 ENCOUNTER — Other Ambulatory Visit: Payer: Self-pay | Admitting: Nurse Practitioner

## 2023-11-17 ENCOUNTER — Encounter: Payer: Self-pay | Admitting: Nurse Practitioner

## 2023-11-17 ENCOUNTER — Ambulatory Visit: Payer: Medicare HMO | Admitting: Nurse Practitioner

## 2023-11-17 VITALS — BP 130/80 | HR 62 | Temp 98.0°F | Ht 67.0 in | Wt 206.6 lb

## 2023-11-17 DIAGNOSIS — Z2821 Immunization not carried out because of patient refusal: Secondary | ICD-10-CM | POA: Diagnosis not present

## 2023-11-17 DIAGNOSIS — E66811 Obesity, class 1: Secondary | ICD-10-CM

## 2023-11-17 DIAGNOSIS — I129 Hypertensive chronic kidney disease with stage 1 through stage 4 chronic kidney disease, or unspecified chronic kidney disease: Secondary | ICD-10-CM

## 2023-11-17 DIAGNOSIS — R3911 Hesitancy of micturition: Secondary | ICD-10-CM | POA: Diagnosis not present

## 2023-11-17 DIAGNOSIS — N1831 Chronic kidney disease, stage 3a: Secondary | ICD-10-CM

## 2023-11-17 DIAGNOSIS — E782 Mixed hyperlipidemia: Secondary | ICD-10-CM | POA: Diagnosis not present

## 2023-11-17 DIAGNOSIS — Z6832 Body mass index (BMI) 32.0-32.9, adult: Secondary | ICD-10-CM | POA: Diagnosis not present

## 2023-11-17 DIAGNOSIS — E1122 Type 2 diabetes mellitus with diabetic chronic kidney disease: Secondary | ICD-10-CM

## 2023-11-17 DIAGNOSIS — E6609 Other obesity due to excess calories: Secondary | ICD-10-CM | POA: Diagnosis not present

## 2023-11-17 MED ORDER — TAMSULOSIN HCL 0.4 MG PO CAPS: 0.4000 mg | ORAL_CAPSULE | Freq: Every day | ORAL | 1 refills | Status: DC

## 2023-11-17 NOTE — Progress Notes (Signed)
 [place], CMA,acting as a Neurosurgeon for Susanna Epley, [name].,have documented all relevant documentation on the behalf of Susanna Epley, [name],as directed by  Susanna Epley, [name] while in the presence of Susanna Epley, [name].  Subjective:  Patient ID: Warren [name] , male    DOB: [birth year] , 78 y.o.   MRN: [id number]  Chief Complaint  Patient presents with   Diabetes   Hypertension    HPI  Patient presents today for a bp and dm follow up, Patient reports compliance with medication. Patient denies any chest pain, SOB, or headaches. Patient has no concerns today. Patient would like a referral to the eye doctor. Patient states "I feel great". He gets januvia and farxiga with patient assistance. He notices overnight his blood sugar goes down will be 155-185. After he eats breakfast it is up to 210.  He does feel his diet has changed tremendously over the last few years. He is planning to start with Silver Sneakers.   Diabetes He presents for his follow-up diabetic visit. He has type 2 diabetes mellitus. There are no hypoglycemic associated symptoms. Pertinent negatives for diabetes include no polydipsia, no polyphagia and no polyuria. There are no hypoglycemic complications. Diabetic complications include nephropathy. Risk factors for coronary artery disease include hypertension, sedentary lifestyle, obesity, dyslipidemia and diabetes mellitus. Current diabetic treatment includes oral agent (dual therapy) and oral agent (monotherapy). He is following a generally healthy diet. When asked about meal planning, he reported none. He has not had a previous visit with a dietitian. He rarely participates in exercise. (Blood sugar goal less than 185. ) An ACE inhibitor/angiotensin II receptor blocker is being taken. He does not see a podiatrist.Eye exam is not current.     Past Medical History:  Diagnosis Date   AKI (acute kidney injury) (HCC) 12/24/2021   Chronic kidney disease    stage 2   Diabetes mellitus  without complication (HCC)    Hypertension    Malaise and fatigue    RLL pneumonia 12/24/2021   Sepsis (HCC) 12/24/2021     Family History  Problem Relation Age of Onset   Hypertension Mother    Hypothyroidism Mother    Alzheimer's disease Father    Prostate cancer Father    Prostate cancer Brother      Current Outpatient Medications:    ACCU-CHEK GUIDE test strip, Use as instructed to check blood sugars 1 time per day, Disp: 50 strip, Rfl: 11   amLODipine (NORVASC) 5 MG tablet, Take 1 tablet (5 mg total) by mouth daily., Disp: 90 tablet, Rfl: 1   atorvastatin (LIPITOR) 80 MG tablet, TAKE ONE TABLET BY MOUTH DAILY, Disp: 90 tablet, Rfl: 3   dapagliflozin propanediol (FARXIGA) 10 MG TABS tablet, Take 1 tablet (10 mg total) by mouth daily., Disp: 90 tablet, Rfl: 3   JANUVIA 100 MG tablet, Take 1 tablet (100 mg total) by mouth daily., Disp: 90 tablet, Rfl: 2   metFORMIN (GLUCOPHAGE-XR) 500 MG 24 hr tablet, Take 1 tablet (500 mg total) by mouth 2 (two) times daily with a meal., Disp: 180 tablet, Rfl: 1   sildenafil (REVATIO) 20 MG tablet, Take 1 tablet (20 mg total) by mouth as needed. MAX 3 TO 5 TABLETS PER DAY, Disp: 60 tablet, Rfl: 1   albuterol (VENTOLIN HFA) 108 (90 Base) MCG/ACT inhaler, Inhale 2 puffs into the lungs every 6 (six) hours as needed for wheezing or shortness of breath. (Patient not taking: Reported on 11/17/2023), Disp: 8 g, Rfl: 2  tamsulosin (FLOMAX) 0.4 MG CAPS capsule, Take 1 capsule (0.4 mg total) by mouth daily., Disp: 90 capsule, Rfl: 1   No Known Allergies   Review of Systems  Constitutional: Negative.   HENT: Negative.    Eyes: Negative.   Respiratory: Negative.    Cardiovascular: Negative.   Gastrointestinal: Negative.   Endocrine: Negative for polydipsia, polyphagia and polyuria.     Today's Vitals   11/17/23 0918 11/17/23 1001  BP: (!) 150/80 130/80  Pulse: 62   Temp: 98 F (36.7 C)   TempSrc: Oral   Weight: 206 lb 9.6 oz (93.7 kg)   Height:  5\' 7"  (1.702 m)   PainSc: 0-No pain    Body mass index is 32.36 kg/m.  Wt Readings from Last 3 Encounters:  11/17/23 206 lb 9.6 oz (93.7 kg)  07/15/23 207 lb (93.9 kg)  04/02/23 201 lb (91.2 kg)     Objective:  Physical Exam Vitals and nursing note reviewed.  Constitutional:      General: He is not in acute distress.    Appearance: Normal appearance. He is obese.  HENT:     Head: Normocephalic.  Cardiovascular:     Rate and Rhythm: Normal rate and regular rhythm.     Pulses: Normal pulses.     Heart sounds: Normal heart sounds. No murmur heard. Pulmonary:     Effort: Pulmonary effort is normal. No respiratory distress.     Breath sounds: Normal breath sounds. No stridor. No wheezing or rhonchi.  Chest:     Chest wall: No tenderness.  Musculoskeletal:        General: No swelling or tenderness. Normal range of motion.  Skin:    General: Skin is warm and dry.     Capillary Refill: Capillary refill takes less than 2 seconds.  Neurological:     General: No focal deficit present.     Mental Status: He is alert and oriented to person, place, and time.     Cranial Nerves: No cranial nerve deficit.     Motor: No weakness.  Psychiatric:        Mood and Affect: Mood normal.        Behavior: Behavior normal.        Thought Content: Thought content normal.        Judgment: Judgment normal.      Assessment And Plan:  Type 2 diabetes mellitus with stage 3a chronic kidney disease, without long-term current use of insulin (HCC) Assessment & Plan: HgbA1c was slightly improved at last visit, he is doing well with his medications. Will check A1c today  Orders: -     BMP8+eGFR -     Hemoglobin A1c -     Microalbumin / creatinine urine ratio  Mixed hyperlipidemia Assessment & Plan: Cholesterol levels were normal at last visit, continue statin.   Orders: -     Lipid panel  Hypertensive nephropathy Assessment & Plan: Blood pressure is slightly elevated repeat is improved,  continue current medications.   Orders: -     BMP8+eGFR  Urinary hesitancy Assessment & Plan: Continue flomax  Orders: -     Tamsulosin HCl; Take 1 capsule (0.4 mg total) by mouth daily.  Dispense: 90 capsule; Refill: 1  Tetanus, diphtheria, and acellular pertussis (Tdap) vaccination declined  Class 1 obesity due to excess calories with body mass index (BMI) of 32.0 to 32.9 in adult, unspecified whether serious comorbidity present Assessment & Plan: he is encouraged to strive for BMI less  than 30 to decrease cardiac risk. Advised to aim for at least 150 minutes of exercise per week.      Return for controlled DM check 4 months; needs HM scheduled.   Patient was given opportunity to ask questions. Patient verbalized understanding of the plan and was able to repeat key elements of the plan. All questions were answered to their satisfaction.   Inge Mangle, [name], have reviewed all documentation for this visit. The documentation on 11/17/23 for the exam, diagnosis, procedures, and orders are all accurate and complete.   IF YOU HAVE BEEN REFERRED TO A SPECIALIST, IT MAY TAKE 1-2 WEEKS TO SCHEDULE/PROCESS THE REFERRAL. IF YOU HAVE NOT HEARD FROM US /SPECIALIST IN TWO WEEKS, PLEASE GIVE US  A CALL AT [phone] X [phone].

## 2023-11-18 LAB — BMP8+EGFR
BUN/Creatinine Ratio: 14 (ref 10–24)
BUN: 20 mg/dL (ref 8–27)
CO2: 19 mmol/L — ABNORMAL LOW (ref 20–29)
Calcium: 10.4 mg/dL — ABNORMAL HIGH (ref 8.6–10.2)
Chloride: 106 mmol/L (ref 96–106)
Creatinine, Ser: 1.41 mg/dL — ABNORMAL HIGH (ref 0.76–1.27)
Glucose: 200 mg/dL — ABNORMAL HIGH (ref 70–99)
Potassium: 5 mmol/L (ref 3.5–5.2)
Sodium: 144 mmol/L (ref 134–144)
eGFR: 52 mL/min/{1.73_m2} — ABNORMAL LOW (ref >59)

## 2023-11-18 LAB — LIPID PANEL
Chol/HDL Ratio: 2.7 ratio (ref 0.0–5.0)
Cholesterol, Total: 177 mg/dL (ref 100–199)
HDL: 65 mg/dL (ref >39)
LDL Chol Calc (NIH): 93 mg/dL (ref 0–99)
Triglycerides: 106 mg/dL (ref 0–149)
VLDL Cholesterol Cal: 19 mg/dL (ref 5–40)

## 2023-11-18 LAB — HEMOGLOBIN A1C
Est. average glucose Bld gHb Est-mCnc: 232 mg/dL
Hgb A1c MFr Bld: 9.7 % — ABNORMAL HIGH (ref 4.8–5.6)

## 2023-11-18 LAB — MICROALBUMIN / CREATININE URINE RATIO
Creatinine, Urine: 56.8 mg/dL
Microalb/Creat Ratio: 14 mg/g{creat} (ref 0–29)
Microalbumin, Urine: 7.8 ug/mL

## 2023-11-24 DIAGNOSIS — R3911 Hesitancy of micturition: Secondary | ICD-10-CM | POA: Insufficient documentation

## 2023-11-24 NOTE — Assessment & Plan Note (Signed)
Blood pressure is slightly elevated repeat is improved, continue current medications

## 2023-11-24 NOTE — Assessment & Plan Note (Signed)
 he is encouraged to strive for BMI less than 30 to decrease cardiac risk. Advised to aim for at least 150 minutes of exercise per week.

## 2023-11-24 NOTE — Assessment & Plan Note (Signed)
 Cholesterol levels were normal at last visit, continue statin.

## 2023-11-24 NOTE — Assessment & Plan Note (Signed)
 HgbA1c was slightly improved at last visit, he is doing well with his medications. Will check A1c today

## 2023-11-24 NOTE — Assessment & Plan Note (Signed)
 Continue flomax

## 2023-11-25 ENCOUNTER — Other Ambulatory Visit: Payer: Self-pay | Admitting: Nurse Practitioner

## 2023-11-25 ENCOUNTER — Telehealth: Payer: Self-pay | Admitting: Pharmacist

## 2023-11-25 DIAGNOSIS — E1122 Type 2 diabetes mellitus with diabetic chronic kidney disease: Secondary | ICD-10-CM

## 2023-11-25 NOTE — Progress Notes (Signed)
   11/25/2023  Patient ID: Warren [name], male   DOB: 10/14/1945, 78 y.o.   MRN: [id number]  Patient's Provider brought a letter into my office from [employer] Patient Assistance Program stating the Patient could not be approved because the original application was not received.  The program was called because per our documentation, everything should have been sent. [employer] said the Patient was approved to receive Januvia on 10/22/2023 and that a shipment of the medication had been sent to the Patient's home that day.  Patient was called to follow up on receipt of the medication but He did not answer the phone. HIPAA compliant message was left on his voicemail requesting a call back.  Patient's A1c is still elevated at 9.7 although down from >10%.   Plan; Call Patient back to follow up on diabetes in 1 week.   Geronimo Krabbe, PharmD, BCACP Clinical Pharmacist [phone]

## 2023-12-03 ENCOUNTER — Telehealth: Payer: Self-pay | Admitting: Pharmacist

## 2023-12-03 DIAGNOSIS — E1122 Type 2 diabetes mellitus with diabetic chronic kidney disease: Secondary | ICD-10-CM

## 2023-12-03 NOTE — Progress Notes (Signed)
 12/03/2023 Name: Warren [name] MRN: [id number] DOB: [birth year]  Chief Complaint  Patient presents with   Medication Management    Diabetes     Warren [name] is a 78 y.o. year old male who presented for a telephone visit.   They were referred to the pharmacist by their PCP for assistance in managing diabetes.    Subjective:  Care Team: Primary Care Provider: Susanna Epley, [name]    Medication Access/Adherence  Current Pharmacy:  [facility] - [place], [place] - [address] [place] [address] Phone: [phone] Fax: [phone]  [facility] #[id number] [place], [place] - [address] AT [facility] OF [address] & [facility] [address] [place] [address] Phone: [phone] Fax: [phone]  Patient reports affordability concerns with their medications: Yes -using patient assistance Patient reports access/transportation concerns to their pharmacy: No  Patient reports adherence concerns with their medications:  No    Objective:  Lab Results  Component Value Date   HGBA1C 9.7 (H) 11/17/2023    Lab Results  Component Value Date   CREATININE 1.41 (H) 11/17/2023   BUN 20 11/17/2023   NA 144 11/17/2023   K 5.0 11/17/2023   CL 106 11/17/2023   CO2 19 (L) 11/17/2023    Lab Results  Component Value Date   CHOL 177 11/17/2023   HDL 65 11/17/2023   LDLCALC 93 11/17/2023   TRIG 106 11/17/2023   CHOLHDL 2.7 11/17/2023    Medications Reviewed Today     Reviewed by Geronimo Krabbe, RPH (Pharmacist) on 12/03/23 at 1301  Med List Status: <None>   Medication Order Taking? Sig Documenting Provider Last Dose Status Informant  ACCU-CHEK GUIDE test strip [id number] Yes Use as instructed to check blood sugars 1 time per day Susanna Epley, [name] Taking Active   albuterol  (VENTOLIN  HFA) 108 (90 Base) MCG/ACT inhaler [id number] Yes Inhale 2 puffs into the lungs every 6 (six) hours as needed for wheezing or shortness of  breath. Cleave Curling, MD Taking Active   amLODipine  (NORVASC ) 5 MG tablet [id number] Yes Take 1 tablet (5 mg total) by mouth daily. Cleave Curling, MD Taking Active   atorvastatin  (LIPITOR ) 80 MG tablet [id number] Yes TAKE ONE TABLET BY MOUTH DAILY Susanna Epley, [name] Taking Active   dapagliflozin  propanediol (FARXIGA ) 10 MG TABS tablet [id number] Yes Take 1 tablet (10 mg total) by mouth daily. Cleave Curling, MD Taking Active   JANUVIA  100 MG tablet [id number] Yes Take 1 tablet (100 mg total) by mouth daily. Susanna Epley, [name] Taking Active   metFORMIN  (GLUCOPHAGE -XR) 500 MG 24 hr tablet [id number] Yes Take 1 tablet (500 mg total) by mouth 2 (two) times daily with a meal. Susanna Epley, [name] Taking Active   sildenafil  (REVATIO ) 20 MG tablet [id number] Yes Take 1 tablet (20 mg total) by mouth as needed. MAX 3 TO 5 TABLETS PER DAY Susanna Epley, [name] Taking Active   tamsulosin  (FLOMAX ) 0.4 MG CAPS capsule [id number] Yes Take 1 capsule (0.4 mg total) by mouth daily. Susanna Epley, [name] Taking Active               Assessment/Plan:   Diabetes: - Currently uncontrolled 9.7% down from >10.4 - Reviewed lifestyle modifications including: exercising and continuing to decrease carbohydrates - Recommend to Continue current therapy but consider injectible... (Patient very hesitant to start any infectible medications.) - Meets financial criteria for Farxiga  and Januvia  patient assistance program through [employer] and  [employer].He is approved through 08/10/2024 (will look into re-enrollment in September/October) On statin therapy -Atorvastatin  80 mg    Follow Up Plan: Follow up with Patient in 6 weeks.  Geronimo Krabbe, PharmD, BCACP Clinical Pharmacist [phone]

## 2024-01-11 ENCOUNTER — Telehealth: Payer: Self-pay | Admitting: Pharmacist

## 2024-01-11 DIAGNOSIS — E1122 Type 2 diabetes mellitus with diabetic chronic kidney disease: Secondary | ICD-10-CM

## 2024-01-11 NOTE — Progress Notes (Signed)
 01/11/2024 Name: Warren [name] MRN: [id number] DOB: 08-Dec-1945  Chief Complaint  Patient presents with   Medication Management    Diabetes      Warren [name] is a 78 y.o. year old male who presented for a telephone visit.   They were referred to the pharmacist by their PCP for assistance in managing diabetes.    Subjective:  Warren [name] is a 78 year old male with multiple medical conditions including but not limited to:  type 2 diabetes, CKD stage 3, hypertension and hyperlipidemia.  The purpose of today's call was to follow up on blood sugars.  A1c has decreased but is still elevated at 9.7%.  Care Team: Primary Care Provider: Susanna Epley, [name] ; Next Scheduled Visit: 03/22/2024  Medication Access/Adherence  Current Pharmacy:  [facility] - [place], [place] - [address] [place] [address] Phone: [phone] Fax: [phone]  [id number] [place], [place] - [address] AT [facility] [address] [facility] [address] [place] [address] Phone: [phone] Fax: [phone]   Patient reports affordability concerns with their medications: Yes Getting Farxiga  and Januvia  via Patient Assistance Patient reports access/transportation concerns to their pharmacy: No  Patient reports adherence concerns with their medications:  No      Diabetes:  Current medications:  Farxiga  10 mg 1 tablet daily Metformin  500 mg 1 tablet twice daily Januvia  100 mg 1 tablet daily   Current glucose readings: Patient reported his AM blood sugars are in the 160s but still I nthe low 200s after eating. Current physical activity: walking outside daily  Current medication access support: Receiving Farxiga  through [employer] and receiving Januvia  through [facility].   Hypertension:  Current medications:   Amlodipine  50mg       Hyperlipidemia/ASCVD Risk Reduction  Current lipid lowering medications:  Atorvastatin   80 mg 1 tablet daily    Objective:  Lab Results  Component Value Date   HGBA1C 9.7 (H) 11/17/2023    Lab Results  Component Value Date   CREATININE 1.41 (H) 11/17/2023   BUN 20 11/17/2023   NA 144 11/17/2023   K 5.0 11/17/2023   CL 106 11/17/2023   CO2 19 (L) 11/17/2023    Lab Results  Component Value Date   CHOL 177 11/17/2023   HDL 65 11/17/2023   LDLCALC 93 11/17/2023   TRIG 106 11/17/2023   CHOLHDL 2.7 11/17/2023    Medications Reviewed Today     Reviewed by Geronimo Krabbe, RPH (Pharmacist) on 01/11/24 at 1111  Med List Status: <None>   Medication Order Taking? Sig Documenting Provider Last Dose Status Informant  ACCU-CHEK GUIDE test strip [id number] Yes Use as instructed to check blood sugars 1 time per day Susanna Epley, [name] Taking Active   albuterol  (VENTOLIN  HFA) 108 (90 Base) MCG/ACT inhaler [id number] Yes Inhale 2 puffs into the lungs every 6 (six) hours as needed for wheezing or shortness of breath. Cleave Curling, MD Taking Active   amLODipine  (NORVASC ) 5 MG tablet [id number] Yes Take 1 tablet (5 mg total) by mouth daily. Cleave Curling, MD Taking Active   atorvastatin  (LIPITOR ) 80 MG tablet [id number] Yes TAKE ONE TABLET BY MOUTH DAILY Susanna Epley, [name] Taking Active   dapagliflozin  propanediol (FARXIGA ) 10 MG TABS tablet [id number] Yes Take 1 tablet (10 mg total) by mouth daily. Cleave Curling, MD Taking Active   JANUVIA  100 MG tablet [id number] Yes Take 1 tablet (100  mg total) by mouth daily. Susanna Epley, [name] Taking Active   metFORMIN  (GLUCOPHAGE -XR) 500 MG 24 hr tablet [id number] Yes Take 1 tablet (500 mg total) by mouth 2 (two) times daily with a meal. Susanna Epley, [name] Taking Active   sildenafil  (REVATIO ) 20 MG tablet [id number] Yes Take 1 tablet (20 mg total) by mouth as needed. MAX 3 TO 5 TABLETS PER DAY Susanna Epley, [name] Taking Active   tamsulosin  (FLOMAX ) 0.4 MG CAPS capsule [id number] Yes Take 1 capsule (0.4 mg total) by mouth daily. Susanna Epley, [name]  Taking Active               Assessment/Plan:   Diabetes: - Currently uncontrolled - Reviewed long term cardiovascular and renal outcomes of uncontrolled blood sugar - Reviewed goal A1c, goal fasting, and goal 2 hour post prandial glucose - Recommend to D/c Januvia  and start Rybelsus  --there is also some room to increase metformin  but this may not have been done already due to the Patient's GFR - Patient denies personal or family history of multiple endocrine neoplasia type 2, medullary thyroid  cancer; personal history of pancreatitis or gallbladder disease. - Will need to complete patient assistance paper work for Rybelsus  -Patient wants to wait until his upcoming appt with his provider to change therapy.  Hypertension: - Currently controlled B/P at last office visit wa 130/80  - Recommend to continue current therapy;   Hyperlipidemia/ASCVD Risk Reduction: - Currently controlled. LDL 93 - Reviewed long term complications of uncontrolled cholesterol - Recommend to continue current therapy watch dietary fats.    Follow Up Plan:    Follow up with Provider just prior to the August Appt with recommendations. Follow up with Patient after the PCP visit.  Geronimo Krabbe, PharmD, BCACP Clinical Pharmacist [phone]

## 2024-01-18 ENCOUNTER — Other Ambulatory Visit: Payer: Self-pay | Admitting: Nurse Practitioner

## 2024-03-02 ENCOUNTER — Telehealth: Payer: Self-pay | Admitting: Pharmacist

## 2024-03-02 DIAGNOSIS — E1122 Type 2 diabetes mellitus with diabetic chronic kidney disease: Secondary | ICD-10-CM

## 2024-03-02 NOTE — Progress Notes (Signed)
   03/02/2024  Patient ID: Warren [name], male   DOB: November 24, 1945, 78 y.o.   MRN: [id number]  Patient called the office and inquired about Januvia . He said it had not arrived. I had Luke Mall, CPhT look into it. She discovered that [employer] will no longer send out automatic refills.  The Patient or the Provider's office has to request the refill.  Kim ordered Januvia  for him.   Patient was called to let him know but he did not answer the phone.   HIPAA compliant message was left on his voicemail.   Plan: Call Patient back in 3-5 business days.   [name], PharmD, BCACP Clinical Pharmacist [phone]

## 2024-03-22 ENCOUNTER — Ambulatory Visit: Admitting: Nurse Practitioner

## 2024-03-22 ENCOUNTER — Encounter: Payer: Self-pay | Admitting: Nurse Practitioner

## 2024-03-22 VITALS — BP 108/60 | HR 64 | Temp 98.7°F | Ht 67.0 in | Wt 200.6 lb

## 2024-03-22 DIAGNOSIS — E66811 Obesity, class 1: Secondary | ICD-10-CM

## 2024-03-22 DIAGNOSIS — E782 Mixed hyperlipidemia: Secondary | ICD-10-CM

## 2024-03-22 DIAGNOSIS — Z6831 Body mass index (BMI) 31.0-31.9, adult: Secondary | ICD-10-CM | POA: Diagnosis not present

## 2024-03-22 DIAGNOSIS — N1831 Chronic kidney disease, stage 3a: Secondary | ICD-10-CM | POA: Diagnosis not present

## 2024-03-22 DIAGNOSIS — E6609 Other obesity due to excess calories: Secondary | ICD-10-CM | POA: Diagnosis not present

## 2024-03-22 DIAGNOSIS — I129 Hypertensive chronic kidney disease with stage 1 through stage 4 chronic kidney disease, or unspecified chronic kidney disease: Secondary | ICD-10-CM | POA: Diagnosis not present

## 2024-03-22 DIAGNOSIS — E1122 Type 2 diabetes mellitus with diabetic chronic kidney disease: Secondary | ICD-10-CM | POA: Diagnosis not present

## 2024-03-22 LAB — CMP14+EGFR
ALT: 17 IU/L (ref 0–44)
AST: 15 IU/L (ref 0–40)
Albumin: 4.4 g/dL (ref 3.8–4.8)
Alkaline Phosphatase: 73 IU/L (ref 44–121)
BUN/Creatinine Ratio: 11 (ref 10–24)
BUN: 14 mg/dL (ref 8–27)
Bilirubin Total: 0.8 mg/dL (ref 0.0–1.2)
CO2: 22 mmol/L (ref 20–29)
Calcium: 10.4 mg/dL — ABNORMAL HIGH (ref 8.6–10.2)
Chloride: 105 mmol/L (ref 96–106)
Creatinine, Ser: 1.32 mg/dL — ABNORMAL HIGH (ref 0.76–1.27)
Globulin, Total: 2.5 g/dL (ref 1.5–4.5)
Glucose: 209 mg/dL — ABNORMAL HIGH (ref 70–99)
Potassium: 4.6 mmol/L (ref 3.5–5.2)
Sodium: 145 mmol/L — ABNORMAL HIGH (ref 134–144)
Total Protein: 6.9 g/dL (ref 6.0–8.5)
eGFR: 56 mL/min/1.73 — ABNORMAL LOW (ref >59)

## 2024-03-22 LAB — LIPID PANEL
Chol/HDL Ratio: 2.4 ratio (ref 0.0–5.0)
Cholesterol, Total: 165 mg/dL (ref 100–199)
HDL: 69 mg/dL (ref >39)
LDL Chol Calc (NIH): 76 mg/dL (ref 0–99)
Triglycerides: 112 mg/dL (ref 0–149)
VLDL Cholesterol Cal: 20 mg/dL (ref 5–40)

## 2024-03-22 LAB — HEMOGLOBIN A1C
Est. average glucose Bld gHb Est-mCnc: 249 mg/dL
Hgb A1c MFr Bld: 10.3 % — ABNORMAL HIGH (ref 4.8–5.6)

## 2024-03-22 MED ORDER — SILDENAFIL CITRATE 20 MG PO TABS: ORAL_TABLET | ORAL | 1 refills | Status: DC

## 2024-03-22 NOTE — Assessment & Plan Note (Signed)
 Blood glucose well-managed. Januvia  supply issue resolved. Emphasized regular foot checks. - Send referral to [facility] for diabetic eye exam. - Order labs for A1c.

## 2024-03-22 NOTE — Progress Notes (Signed)
 LILLETTE Kristeen JINNY Gladis, CMA,acting as a Neurosurgeon for Gaines Ada, [name].,have documented all relevant documentation on the behalf of Gaines Ada, [name],as directed by  Gaines Ada, [name] while in the presence of Gaines Ada, [name].  Subjective:  Patient ID: Warren [name] , male    DOB: 04/26/1946 , 78 y.o.   MRN: [id number]  Chief Complaint  Patient presents with   Hypertension    Patient presents today for a bp and dm follow up, Patient reports compliance with medication. Patient denies any chest pain, SOB, or headaches. Patient has no concerns today.     HPI Discussed the use of AI scribe software for clinical note transcription with the patient, who gave verbal consent to proceed.  History of Present Illness Warren [name] is a 78 year old male with diabetes who presents for medication management, diabetes and hypertension f/u.  He experienced a disruption in his medication regimen, specifically with Januvia , which he was unable to receive for approximately two weeks. This issue has been resolved, and he is now back on track with his medication. He was without Januvia  for about ten days to two weeks. His blood sugars have been stable despite this interruption.  He has not yet visited the eye doctor for his diabetic eye exam and has missed a couple of appointments and is willing to go to [facility] for his diabetic eye exam. He uses over-the-counter readers for vision support.  He wears slippers most of the time at home and only occasionally walks barefoot. He recalls having issues with his arches when he was younger, but these have resolved over time.  He is currently taking sildenafil  and requires a refill. He appreciates the convenience of having a 60 or 90-day supply of his medications.           Diabetes He presents for his follow-up diabetic visit. He has type 2 diabetes mellitus. His disease course has been improving. There are no hypoglycemic associated  symptoms. Pertinent negatives for diabetes include no polydipsia, no polyphagia and no polyuria. There are no hypoglycemic complications. Diabetic complications include nephropathy. Risk factors for coronary artery disease include hypertension, sedentary lifestyle, dyslipidemia, diabetes mellitus, male sex and obesity. Current diabetic treatment includes oral agent (dual therapy) and oral agent (monotherapy). He is following a generally healthy diet. When asked about meal planning, he reported none. He has not had a previous visit with a dietitian. He rarely participates in exercise. (He reports his blood sugars have been good, did not give me a number) An ACE inhibitor/angiotensin II receptor blocker is being taken. He does not see a podiatrist.Eye exam is not current.  Hypertension This is a chronic problem. The current episode started more than 1 year ago. The problem has been gradually improving since onset. The problem is controlled. Pertinent negatives include no anxiety. Risk factors for coronary artery disease include diabetes mellitus, male gender, obesity, dyslipidemia and sedentary lifestyle. Past treatments include calcium  channel blockers. The current treatment provides significant improvement. Compliance problems include exercise.  Hypertensive end-organ damage includes kidney disease. Identifiable causes of hypertension include chronic renal disease.     Past Medical History:  Diagnosis Date   AKI (acute kidney injury) (HCC) 12/24/2021   Chronic kidney disease    stage 2   Class 1 obesity due to excess calories with body mass index (BMI) of 32.0 to 32.9 in adult 07/15/2023   Diabetes mellitus without complication (HCC)    Hypertension    Malaise and fatigue  RLL pneumonia 12/24/2021   Sepsis (HCC) 12/24/2021     Family History  Problem Relation Age of Onset   Hypertension Mother    Hypothyroidism Mother    Alzheimer's disease Father    Prostate cancer Father    Prostate  cancer Brother      Current Outpatient Medications:    ACCU-CHEK GUIDE test strip, Use as instructed to check blood sugars 1 time per day, Disp: 50 strip, Rfl: 11   albuterol  (VENTOLIN  HFA) 108 (90 Base) MCG/ACT inhaler, Inhale 2 puffs into the lungs every 6 (six) hours as needed for wheezing or shortness of breath., Disp: 8 g, Rfl: 2   amLODipine  (NORVASC ) 5 MG tablet, Take 1 tablet (5 mg total) by mouth daily., Disp: 90 tablet, Rfl: 1   atorvastatin  (LIPITOR ) 80 MG tablet, TAKE ONE TABLET BY MOUTH DAILY, Disp: 90 tablet, Rfl: 3   dapagliflozin  propanediol (FARXIGA ) 10 MG TABS tablet, Take 1 tablet (10 mg total) by mouth daily., Disp: 90 tablet, Rfl: 3   JANUVIA  100 MG tablet, Take 1 tablet (100 mg total) by mouth daily., Disp: 90 tablet, Rfl: 2   metFORMIN  (GLUCOPHAGE -XR) 500 MG 24 hr tablet, Take 1 tablet (500 mg total) by mouth 2 (two) times daily with a meal., Disp: 180 tablet, Rfl: 1   tamsulosin  (FLOMAX ) 0.4 MG CAPS capsule, Take 1 capsule (0.4 mg total) by mouth daily., Disp: 90 capsule, Rfl: 1   sildenafil  (REVATIO ) 20 MG tablet, TAKE ONE TABLET BY MOUTH EVERY DAY AS NEEDED. ** Max 3-5 tablets PER DAY, Disp: 60 tablet, Rfl: 1   No Known Allergies   Review of Systems  Constitutional: Negative.   HENT: Negative.    Eyes: Negative.   Respiratory: Negative.    Cardiovascular: Negative.   Gastrointestinal: Negative.   Endocrine: Negative for polydipsia, polyphagia and polyuria.     Today's Vitals   03/22/24 1009  BP: 108/60  Pulse: 64  Temp: 98.7 F (37.1 C)  TempSrc: Oral  Weight: 200 lb 9.6 oz (91 kg)  Height: 5' 7 (1.702 m)  PainSc: 0-No pain   Body mass index is 31.42 kg/m.  Wt Readings from Last 3 Encounters:  03/22/24 200 lb 9.6 oz (91 kg)  11/17/23 206 lb 9.6 oz (93.7 kg)  07/15/23 207 lb (93.9 kg)      Objective:  Physical Exam Vitals and nursing note reviewed.  Constitutional:      General: He is not in acute distress.    Appearance: Normal  appearance. He is obese.  HENT:     Head: Normocephalic.  Cardiovascular:     Rate and Rhythm: Normal rate and regular rhythm.     Pulses: Normal pulses.     Heart sounds: Normal heart sounds. No murmur heard. Pulmonary:     Effort: Pulmonary effort is normal. No respiratory distress.     Breath sounds: Normal breath sounds. No stridor. No wheezing or rhonchi.  Chest:     Chest wall: No tenderness.  Musculoskeletal:        General: No swelling or tenderness. Normal range of motion.  Skin:    General: Skin is warm and dry.     Capillary Refill: Capillary refill takes less than 2 seconds.  Neurological:     General: No focal deficit present.     Mental Status: He is alert and oriented to person, place, and time.     Cranial Nerves: No cranial nerve deficit.     Motor: No weakness.  Psychiatric:        Mood and Affect: Mood normal.        Behavior: Behavior normal.        Thought Content: Thought content normal.        Judgment: Judgment normal.      Diabetic foot exam was performed with the following findings:   No deformities, ulcerations, or other skin breakdown Normal sensation of 10g monofilament Intact posterior tibialis and dorsalis pedis pulses     Assessment And Plan:  Type 2 diabetes mellitus with stage 3a chronic kidney disease, without long-term current use of insulin  (HCC) Assessment & Plan: Blood glucose well-managed. Januvia  supply issue resolved. Emphasized regular foot checks. - Send referral to [facility] for diabetic eye exam. - Order labs for A1c.  Orders: -     Hemoglobin A1c -     CMP14+EGFR -     Ambulatory referral to Ophthalmology  Hypertensive nephropathy Assessment & Plan: Blood pressure is controlled, continue current medications. Will check eGFR   Mixed hyperlipidemia Assessment & Plan: Cholesterol levels were normal at last visit, continue statin.   Orders: -     Lipid panel -     CMP14+EGFR  Class 1 obesity due to excess  calories with serious comorbidity and body mass index (BMI) of 31.0 to 31.9 in adult  Other orders -     Sildenafil  Citrate; TAKE ONE TABLET BY MOUTH EVERY DAY AS NEEDED. ** Max 3-5 tablets PER DAY  Dispense: 60 tablet; Refill: 1     Return for controlled DM check 4 months.  Patient was given opportunity to ask questions. Patient verbalized understanding of the plan and was able to repeat key elements of the plan. All questions were answered to their satisfaction.    LILLETTE Gaines Ada, [name], have reviewed all documentation for this visit. The documentation on 03/22/24 for the exam, diagnosis, procedures, and orders are all accurate and complete.   IF YOU HAVE BEEN REFERRED TO A SPECIALIST, IT MAY TAKE 1-2 WEEKS TO SCHEDULE/PROCESS THE REFERRAL. IF YOU HAVE NOT HEARD FROM US /SPECIALIST IN TWO WEEKS, PLEASE GIVE US  A CALL AT [phone] X [phone].

## 2024-03-22 NOTE — Assessment & Plan Note (Signed)
 Blood pressure is controlled, continue current medications. Will check eGFR

## 2024-03-22 NOTE — Assessment & Plan Note (Signed)
 Cholesterol levels were normal at last visit, continue statin.

## 2024-03-22 NOTE — Assessment & Plan Note (Signed)
 Blood pressure is well controlled, continue current medications.

## 2024-04-06 ENCOUNTER — Telehealth: Payer: Self-pay

## 2024-04-06 NOTE — Telephone Encounter (Signed)
 Patient was identified as falling into the [place] Measure - Diabetes.   Patient was: Attribution and/or data issue.  Validation/Investigation needed.  Explanation:   Patient had A1c checked 03/22/2024, patient is not yet due for A1c recheck.

## 2024-04-07 ENCOUNTER — Ambulatory Visit: Payer: Self-pay

## 2024-04-08 ENCOUNTER — Ambulatory Visit (INDEPENDENT_AMBULATORY_CARE_PROVIDER_SITE_OTHER)

## 2024-04-08 DIAGNOSIS — N1831 Chronic kidney disease, stage 3a: Secondary | ICD-10-CM

## 2024-04-08 DIAGNOSIS — E1122 Type 2 diabetes mellitus with diabetic chronic kidney disease: Secondary | ICD-10-CM | POA: Diagnosis not present

## 2024-04-08 LAB — HM DIABETES EYE EXAM

## 2024-04-08 NOTE — Progress Notes (Signed)
 Warren [name] arrived 04/08/2024 and has given verbal consent to obtain images and complete their overdue diabetic retinal screening.  The images have been sent to an ophthalmologist or optometrist for review and interpretation.  Results will be sent back to Georgina Speaks, [name] for review.  Patient has been informed they will be contacted when we receive the results via telephone or MyChart Images obtained of L eye only. Unable to obtain readable images of R eye. Mjp,lpn

## 2024-04-13 ENCOUNTER — Ambulatory Visit

## 2024-04-14 ENCOUNTER — Other Ambulatory Visit: Payer: Self-pay | Admitting: Nurse Practitioner

## 2024-04-14 DIAGNOSIS — R3911 Hesitancy of micturition: Secondary | ICD-10-CM

## 2024-04-20 ENCOUNTER — Telehealth: Payer: Self-pay

## 2024-04-20 ENCOUNTER — Ambulatory Visit: Payer: Self-pay | Admitting: Nurse Practitioner

## 2024-04-20 NOTE — Telephone Encounter (Signed)
 Pt's diabetic eye exam results came back as pt is needing to be re-screened. Copy of results scanned into patient's chart. Thank you. Mjp,lpn

## 2024-04-25 ENCOUNTER — Telehealth: Payer: Self-pay | Admitting: Pharmacy Technician

## 2024-04-25 NOTE — Progress Notes (Signed)
 04/25/2024 Name: Warren [name] MRN: [id number] DOB: 02/26/1946  Patient is appearing on a report for Warren [name] and last engaged with the clinical pharmacist to discuss [name] on 01/11/2024. Contacted patient today to discuss [name] management and completed medication review.   [name] Plan from last clinical pharmacist appointment:  [name]: - Currently uncontrolled - Reviewed long term cardiovascular and renal outcomes of uncontrolled blood sugar - Reviewed goal A1c, goal fasting, and goal 2 hour post prandial glucose - Recommend to D/c Januvia  and start Rybelsus  --there is also some room to increase metformin  but this may not have been done already due to the Patient's GFR - Patient denies personal or family history of multiple endocrine neoplasia type 2, medullary thyroid  cancer; personal history of pancreatitis or gallbladder disease. - Will need to complete patient assistance paper work for Rybelsus  -Patient wants to wait until his upcoming appt with his provider to change therapy.  Hypertension: - Currently controlled B/P at last office visit wa 130/80  - Recommend to continue current therapy;   Hyperlipidemia/ASCVD Risk Reduction: - Currently controlled. LDL 93 - Reviewed long term complications of uncontrolled cholesterol - Recommend to continue current therapy watch dietary fats.    Follow Up Plan:   Follow up with Provider just prior to the August Appt with recommendations. Follow up with Patient after the PCP visit.(copy/paste from last note)   Medication Adherence Barriers Identified:  Patient made recommended medication changes per plan: Yes Patient informs he is taking Fariga 10mg  daily, Januvia  100mg  daily and Metformin  XR 500mg  twice a day. He endorses adherence to taking his medication and has these medications on hand currently.  Access issues with any new medication or testing device: No Patient receives Farxiga  from patient assistance and is  signed up for automatics refills from [employer]. He informs this has been coming in a timely manner. Patient also receives Januvia  from [employer] patient assistance. He informs there was a time when it was coming and then it just stopped. He informs this has since been resolved. He informs his last shipment was for 90 day supply. Informed patient that [employer] does NOT do automatic refills and he will have to call to re order the medication. The number for him to call is [phone]. Advised patient to call when he has a 2 week supply remaining to avoid any delay in therapy.  Per Dr Annemarie data, Metformin  last filled for 90 day supply on 02/15/2024, Atorvastatin  last filled for 90 on 04/14/2024, Amlodipine  last filled for 90 on 02/09/2024 Patient is checking blood sugars as prescribed: Yes Patient informs he checks his blood sugar with finger stick checks. He informs it will range from 150-180 with the average being 165. Patient's last A1C on 03/22/2024 is 10.3. Patient is NOT willing to try Rybelsus . He informs he was out of Januvia  for about a month which is what he attributes to the increase in A1C. He is currently satisfied with his current treatment plan. Patient informs he received a letter from his insurance requesting he have an Annual Wellness visit. He was informed this was different than a regular physical and is more of a health risk assessment that Medicare offers at no charge to patents. He informs he will call and schedule this.  Medication Adherence Barriers Addressed/Actions Taken:  Reviewed medication changes per plan from last clinical pharmacist note Educated patient to contact pharmacy regarding new prescriptions Patient Assistance for [place] and [facility] Patient Assistance Program approved   Reviewed instructions  for monitoring blood sugars at home and reminded patient to keep a written log to review with pharmacist Reminded patient of date/time of upcoming clinical pharmacist follow up and any  upcoming PCP/specialists visits. Patient denies transportation barriers to the appointment. Yes  Next clinical pharmacist appointment is scheduled for: TBD  Warren [name], CPhT [name] Office: [phone] Email: [name]

## 2024-05-02 ENCOUNTER — Telehealth: Payer: Self-pay | Admitting: Pharmacist

## 2024-05-02 DIAGNOSIS — N1831 Chronic kidney disease, stage 3a: Secondary | ICD-10-CM

## 2024-05-02 NOTE — Progress Notes (Signed)
 05/02/2024 Name: Warren [name] MRN: [id number] DOB: 31-Dec-1945  Chief Complaint  Patient presents with   Medication Management    Diabetes     Warren [name] is a 78 y.o. year old male who presented for a telephone visit.   They were referred to the pharmacist by a quality report for assistance in managing diabetes. ([employer] Information systems manager)   Subjective:  Warren [name] is a 78 year old male with multiple medical conditions including but not limited to:  hyperlipidemia, hypertension, type 2 diabetes, and chronic kidney disease stage III.  Care Team: Primary Care Provider: Georgina Speaks, [name] ; Next Scheduled Visit:  07/20/24   Medication Access/Adherence  Current Pharmacy:  [facility] - [place], [place] - [address] [place] [address] Phone: [phone] Fax: [phone]  [id number] [place], [place] - [address] AT [facility] [address] [facility] [address] [place] [address] Phone: [phone] Fax: [phone]   Patient reports affordability concerns with their medications: Yes  Gets Farxiga  and Januvia  via patient assistance Patient reports access/transportation concerns to their pharmacy: No  Patient reports adherence concerns with their medications:  Yes  reports being out of    Diabetes:  Current medications:    Januvia    Current glucose readings: Does not check daily. Reported his blood sugar as 165 mg/dl three days prior to our call 2 hours after a meal.    Patient denies hypoglycemic s/sx including  dizziness, shakiness, sweating. Patient denies hyperglycemic symptoms including  polyuria, polydipsia, polyphagia, nocturia, neuropathy, blurred vision.    Macrovascular and Microvascular Risk Reduction:  Statin? yes (Atorvastatin  80 mg last filled 04/14/24 #90); ACEi/ARB? no  Last urinary albumin/creatinine ratio:  Lab Results  Component Value Date   MICRALBCREAT 14 11/17/2023    MICRALBCREAT 36 (H) 11/26/2022   MICRALBCREAT 30 07/10/2021   MICRALBCREAT <30 03/26/2020   MICRALBCREAT 30 02/08/2019   Last eye exam:  Lab Results  Component Value Date   HMDIABEYEEXA  04/08/2024     Comment:     unreadable - left, no image - right abst by him   Last foot exam: 03/22/2024 Tobacco Use:  Tobacco Use: Low Risk  (03/22/2024)   Patient History    Smoking Tobacco Use: Never    Smokeless Tobacco Use: Never    Passive Exposure: Not on file     Objective:  Lab Results  Component Value Date   HGBA1C 10.3 (H) 03/22/2024    Lab Results  Component Value Date   CREATININE 1.32 (H) 03/22/2024   BUN 14 03/22/2024   NA 145 (H) 03/22/2024   K 4.6 03/22/2024   CL 105 03/22/2024   CO2 22 03/22/2024    Lab Results  Component Value Date   CHOL 165 03/22/2024   HDL 69 03/22/2024   LDLCALC 76 03/22/2024   TRIG 112 03/22/2024   CHOLHDL 2.4 03/22/2024    Medications Reviewed Today     Reviewed by Jolee Cassius PARAS, RPH (Pharmacist) on 05/02/24 at 1339  Med List Status: <None>   Medication Order Taking? Sig Documenting Provider Last Dose Status Informant  ACCU-CHEK GUIDE test strip [id number] Yes Use as instructed to check blood sugars 1 time per day Warren Speaks, [name]  Active   albuterol  (VENTOLIN  HFA) 108 (90 Base) MCG/ACT inhaler [id number] Yes Inhale 2 puffs into the lungs every 6 (six) hours as needed for wheezing or shortness of breath.  Jarold Medici, MD  Active   amLODipine  (NORVASC ) 5 MG tablet [id number] Yes Take 1 tablet (5 mg total) by mouth daily. Jarold Medici, MD  Active   atorvastatin  (LIPITOR ) 80 MG tablet [id number] Yes TAKE ONE TABLET BY MOUTH DAILY Moore, Janece, [name]  Active   dapagliflozin  propanediol (FARXIGA ) 10 MG TABS tablet [id number] Yes Take 1 tablet (10 mg total) by mouth daily. Jarold Medici, MD  Active   JANUVIA  100 MG tablet [id number] Yes Take 1 tablet (100 mg total) by mouth daily. Warren Speaks, [name]  Active   metFORMIN  (GLUCOPHAGE -XR) 500  MG 24 hr tablet [id number] Yes Take 1 tablet (500 mg total) by mouth 2 (two) times daily with a meal. Warren Speaks, [name]  Active   sildenafil  (REVATIO ) 20 MG tablet [id number] Yes TAKE ONE TABLET BY MOUTH EVERY DAY AS NEEDED. ** Max 3-5 tablets PER DAY Warren Speaks, [name]  Active   tamsulosin  (FLOMAX ) 0.4 MG CAPS capsule [id number] Yes Take 1 capsule (0.4 mg total) by mouth daily. Warren Speaks, [name]  Active               03/22/2024   10:09 AM 11/17/2023   10:01 AM 11/17/2023    9:18 AM  Vitals with BMI  Height 5' 7  5' 7  Weight 200 lbs 10 oz  206 lbs 10 oz  BMI 31.41  32.35  Systolic 108 130 849  Diastolic 60 80 80  Pulse 64  62      Assessment/Plan:   Diabetes: - Currently uncontrolled; goal A1c <7%. Cardiorenal risk reduction is opportunities for improvement.. Blood pressure is at goal <130/80. LDL is at goal.  - Reviewed long term complications of diabetes.  Patient shared that he thinks his A1c increased because he was off Januvia .  He did not understand that he needed to call in for a refill.  He thought it would be automatic like Farxiga .  He reported having all of his medications now. - It has been suggested to the Patient several times that He trial insulin  therapy but he refused.  Follow Up Plan:    Follow up with the Patient in 1-2 months for re-enrollment and to follow up on blood sugars.   [name], PharmD, BCACP Clinical Pharmacist [phone]

## 2024-05-06 ENCOUNTER — Ambulatory Visit

## 2024-05-17 ENCOUNTER — Telehealth: Payer: Self-pay | Admitting: Pharmacist

## 2024-05-17 ENCOUNTER — Other Ambulatory Visit: Payer: Self-pay | Admitting: Nurse Practitioner

## 2024-05-17 DIAGNOSIS — E1122 Type 2 diabetes mellitus with diabetic chronic kidney disease: Secondary | ICD-10-CM

## 2024-05-17 NOTE — Progress Notes (Signed)
   05/17/2024  Patient ID: Warren [name], male   DOB: 08-29-1945, 78 y.o.   MRN: [id number]  Patient called and left a message on my voicemail that he needed a refill on Januvia .  When I called the Patient back, he did not answer. [employer] was called on the Patient's behalf to request a refill on Januvia  100 mg.  They will deliver it to the Patient's home in the next 3-5 buisness days.    Patient called me back and communicated understanding. He was instructed to give me a call if he has not received his shipment by next Monday.  Patient's HgA1c has been very elevated for quite some time. Insulin  therapy has been suggested multiple times but the Patient has declined.   Lab Results  Component Value Date   HGBA1C 10.3 (H) 03/22/2024   HGBA1C 9.7 (H) 11/17/2023   HGBA1C 10.3 (H) 07/15/2023    Next PCP appt 12/25  Follow up with Patient in 3 weeks for patient assistance renewal and blood sugars.  Warren [name], PharmD, BCACP Clinical Pharmacist [phone]

## 2024-05-18 ENCOUNTER — Telehealth: Payer: Self-pay

## 2024-05-18 NOTE — Telephone Encounter (Signed)
 PAP: Patient assistance application for Farxiga  through [employer] (AZ&Me) has been mailed to pt's home address on file. Provider portion of application will be faxed to provider's office.For renewal 2026.

## 2024-05-25 ENCOUNTER — Encounter: Payer: Self-pay | Admitting: Pharmacist

## 2024-05-25 NOTE — Progress Notes (Signed)
   05/25/2024  Patient ID: Warren [name], male   DOB: [birth year], 78 y.o.   MRN: [id number]  AZ&Me form sent to [employer] for Farxiga  10 mg. 2026 Renewal.   [name], PharmD, [facility] Clinical Pharmacist [phone]

## 2024-05-26 ENCOUNTER — Other Ambulatory Visit (HOSPITAL_COMMUNITY): Payer: Self-pay

## 2024-05-26 NOTE — Telephone Encounter (Signed)
 Received provider portion PAP application for Farxiga  (AZ&ME)

## 2024-06-02 NOTE — Telephone Encounter (Signed)
 PAP: Application for Warren [name] has been submitted to [employer] (AZ&Me), via fax

## 2024-06-15 ENCOUNTER — Ambulatory Visit (INDEPENDENT_AMBULATORY_CARE_PROVIDER_SITE_OTHER)

## 2024-06-15 VITALS — BP 130/70 | HR 64 | Temp 97.9°F | Ht 67.0 in | Wt 203.8 lb

## 2024-06-15 DIAGNOSIS — Z0000 Encounter for general adult medical examination without abnormal findings: Secondary | ICD-10-CM

## 2024-06-15 NOTE — Patient Instructions (Signed)
 Warren [name],  Thank you for taking the time for your Medicare Wellness Visit. I appreciate your continued commitment to your health goals. Please review the care plan we discussed, and feel free to reach out if I can assist you further.  Please note that Annual Wellness Visits do not include a physical exam. Some assessments may be limited, especially if the visit was conducted virtually. If needed, we may recommend an in-person follow-up with your provider.  Ongoing Care Seeing your primary care provider every 3 to 6 months helps us  monitor your health and provide consistent, personalized care.   Referrals If a referral was made during today's visit and you haven't received any updates within two weeks, please contact the referred provider directly to check on the status.  Recommended Screenings:  Health Maintenance  Topic Date Due   Eye exam for diabetics  08/15/2022   Medicare Annual Wellness Visit  04/01/2024   COVID-19 Vaccine (5 - 2025-26 season) 04/11/2024   Flu Shot  11/08/2024*   DTaP/Tdap/Td vaccine (2 - Td or Tdap) 11/16/2024*   Hemoglobin A1C  09/22/2024   Yearly kidney health urinalysis for diabetes  11/16/2024   Yearly kidney function blood test for diabetes  03/22/2025   Complete foot exam   03/22/2025   Pneumococcal Vaccine for age over 38  Completed   Hepatitis C Screening  Completed   Zoster (Shingles) Vaccine  Completed   Meningitis B Vaccine  Aged Out   Cologuard (Stool DNA test)  Discontinued  *Topic was postponed. The date shown is not the original due date.       06/15/2024    8:34 AM  Advanced Directives  Does Patient Have a Medical Advance Directive? No  Would patient like information on creating a medical advance directive? No - Patient declined    Vision: Annual vision screenings are recommended for early detection of glaucoma, cataracts, and diabetic retinopathy. These exams can also reveal signs of chronic conditions such as diabetes and high blood  pressure.  Dental: Annual dental screenings help detect early signs of oral cancer, gum disease, and other conditions linked to overall health, including heart disease and diabetes.  Please see the attached documents for additional preventive care recommendations.

## 2024-06-15 NOTE — Progress Notes (Signed)
 Subjective:   Warren [name] is a 78 y.o. male who presents for a Medicare Annual Wellness Visit.  Allergies (verified) Patient has no known allergies.   History: Past Medical History:  Diagnosis Date   AKI (acute kidney injury) 12/24/2021   Chronic kidney disease    stage 2   Class 1 obesity due to excess calories with body mass index (BMI) of 32.0 to 32.9 in adult 07/15/2023   Diabetes mellitus without complication (HCC)    Hypertension    Malaise and fatigue    RLL pneumonia 12/24/2021   Sepsis (HCC) 12/24/2021   Past Surgical History:  Procedure Laterality Date   surgical repair     metatarsal repair.    Family History  Problem Relation Age of Onset   Hypertension Mother    Hypothyroidism Mother    Alzheimer's disease Father    Prostate cancer Father    Prostate cancer Brother    Social History   Occupational History   Occupation: retired  Tobacco Use   Smoking status: Never   Smokeless tobacco: Never  Vaping Use   Vaping status: Never Used  Substance and Sexual Activity   Alcohol use: Yes    Alcohol/week: 7.0 standard drinks of alcohol    Types: 7 Cans of beer per week    Comment: daily   Drug use: Yes    Types: Marijuana   Sexual activity: Yes   Tobacco Counseling Counseling given: Not Answered  SDOH Screenings   Food Insecurity: No Food Insecurity (06/15/2024)  Housing: Unknown (06/15/2024)  Transportation Needs: No Transportation Needs (06/15/2024)  Utilities: Not At Risk (06/15/2024)  Alcohol Screen: Low Risk  (06/15/2024)  Depression (PHQ2-9): Low Risk  (06/15/2024)  Financial Resource Strain: Low Risk  (06/15/2024)  Physical Activity: Inactive (06/15/2024)  Social Connections: Moderately Isolated (06/15/2024)  Stress: No Stress Concern Present (06/15/2024)  Tobacco Use: Low Risk  (06/15/2024)  Health Literacy: Adequate Health Literacy (06/15/2024)   Depression Screen    06/15/2024    8:29 AM 04/02/2023   10:10 AM 11/26/2022    9:48 AM  07/23/2022   11:25 AM 03/12/2022    3:36 PM 01/01/2022   10:36 AM 12/13/2020    9:09 AM  PHQ 2/9 Scores  PHQ - 2 Score 0 3 0 0 0 0 0  PHQ- 9 Score 0 6          Goals Addressed             This Visit's Progress    Patient Stated       06/15/2024, wants to cut down on medication and get blood sugar down       Visit info / Clinical Intake: Medicare Wellness Visit Type:: Subsequent Annual Wellness Visit Medicare Wellness Visit Mode:: In-person (required for WTM) Interpreter Needed?: No Pre-visit prep was completed: yes AWV questionnaire completed by patient prior to visit?: no Living arrangements:: (!) lives alone Patient's Overall Health Status Rating: good Typical amount of pain: some Does pain affect daily life?: no Are you currently prescribed opioids?: no  Dietary Habits and Nutritional Risks How many meals a day?: 2 Eats fruit and vegetables daily?: yes Most meals are obtained by: preparing own meals Diabetic:: (!) yes Any non-healing wounds?: no How often do you check your BS?: 1 Would you like to be referred to a Nutritionist or for Diabetic Management? : no  Functional Status Activities of Daily Living (to include ambulation/medication): Independent Ambulation: Independent Medication Administration: Independent Home Management: Independent Manage  your own finances?: yes Primary transportation is: driving Concerns about vision?: (!) yes (has cataracts) Concerns about hearing?: no  Fall Screening Falls in the past year?: 0 Number of falls in past year: 0 Was there an injury with Fall?: 0 Fall Risk Category Calculator: 0 Patient Fall Risk Level: Low Fall Risk  Fall Risk Patient at Risk for Falls Due to: Medication side effect Fall risk Follow up: Falls prevention discussed; Falls evaluation completed  Home and Transportation Safety: All rugs have non-skid backing?: N/A, no rugs All stairs or steps have railings?: yes Grab bars in the bathtub or shower?:  (!) no Have non-skid surface in bathtub or shower?: yes Good home lighting?: yes Regular seat belt use?: yes Hospital stays in the last year:: no  Cognitive Assessment Difficulty concentrating, remembering, or making decisions? : no Will 6CIT or Mini Cog be Completed: yes What year is it?: 0 points What month is it?: 0 points Give patient an address phrase to remember (5 components): [address] About what time is it?: 3 points Count backwards from 20 to 1: 0 points Say the months of the year in reverse: 2 points Repeat the address phrase from earlier: 4 points 6 CIT Score: 9 points  Advance Directives (For Healthcare) Does Patient Have a Medical Advance Directive?: No Would patient like information on creating a medical advance directive?: No - Patient declined  Reviewed/Updated  Reviewed/Updated: All        Objective:    Today's Vitals   06/15/24 0821  BP: 130/70  Pulse: 64  Temp: 97.9 F (36.6 C)  TempSrc: Oral  SpO2: 97%  Weight: 203 lb 12.8 oz (92.4 kg)  Height: 5' 7 (1.702 m)   Body mass index is 31.92 kg/m.  Current Medications (verified) Outpatient Encounter Medications as of 06/15/2024  Medication Sig   ACCU-CHEK GUIDE test strip Use as instructed to check blood sugars 1 time per day   albuterol  (VENTOLIN  HFA) 108 (90 Base) MCG/ACT inhaler Inhale 2 puffs into the lungs every 6 (six) hours as needed for wheezing or shortness of breath.   amLODipine  (NORVASC ) 5 MG tablet Take 1 tablet (5 mg total) by mouth daily.   atorvastatin  (LIPITOR ) 80 MG tablet TAKE ONE TABLET BY MOUTH DAILY   dapagliflozin  propanediol (FARXIGA ) 10 MG TABS tablet Take 1 tablet (10 mg total) by mouth daily.   JANUVIA  100 MG tablet Take 1 tablet (100 mg total) by mouth daily.   metFORMIN  (GLUCOPHAGE -XR) 500 MG 24 hr tablet Take 1 tablet (500 mg total) by mouth 2 (two) times daily with a meal.   sildenafil  (REVATIO ) 20 MG tablet TAKE ONE TABLET BY MOUTH EVERY DAY AS NEEDED. **  Max 3-5 tablets PER DAY   tamsulosin  (FLOMAX ) 0.4 MG CAPS capsule Take 1 capsule (0.4 mg total) by mouth daily.   No facility-administered encounter medications on file as of 06/15/2024.   Hearing/Vision screen No results found. Immunizations and Health Maintenance Health Maintenance  Topic Date Due   OPHTHALMOLOGY EXAM  08/15/2022   COVID-19 Vaccine (5 - 2025-26 season) 07/01/2024 (Originally 04/11/2024)   Influenza Vaccine  11/08/2024 (Originally 03/11/2024)   DTaP/Tdap/Td (2 - Td or Tdap) 11/16/2024 (Originally 07/20/2023)   HEMOGLOBIN A1C  09/22/2024   Diabetic kidney evaluation - Urine ACR  11/16/2024   Diabetic kidney evaluation - eGFR measurement  03/22/2025   FOOT EXAM  03/22/2025   Medicare Annual Wellness (AWV)  06/15/2025   Pneumococcal Vaccine: 50+ Years  Completed  Hepatitis C Screening  Completed   Zoster Vaccines- Shingrix   Completed   Meningococcal B Vaccine  Aged Out   Fecal DNA (Cologuard)  Discontinued        Assessment/Plan:  This is a routine wellness examination for [place].  Patient Care Team: Georgina Speaks, [name] as PCP - General (General Practice) Pearson, Vallie J, [facility] (Inactive) (Pharmacist) [facility] Jolee Cassius PARAS, [facility] as Pharmacist (Pharmacist)  I have personally reviewed and noted the following in the patient's chart:   Medical and social history Use of alcohol, tobacco or illicit drugs  Current medications and supplements including opioid prescriptions. Functional ability and status Nutritional status Physical activity Advanced directives List of other physicians Hospitalizations, surgeries, and ER visits in previous 12 months Vitals Screenings to include cognitive, depression, and falls Referrals and appointments  No orders of the defined types were placed in this encounter.  In addition, I have reviewed and discussed with patient certain preventive protocols, quality metrics, and best practice recommendations. A  written personalized care plan for preventive services as well as general preventive health recommendations were provided to patient.   Ardella FORBES Dawn, LPN   88/11/7972   Return in 1 year (on 06/15/2025).  After Visit Summary: (In Person-Declined) Patient declined AVS at this time.  Nurse Notes: none

## 2024-07-19 ENCOUNTER — Other Ambulatory Visit: Payer: Self-pay | Admitting: Nurse Practitioner

## 2024-07-19 DIAGNOSIS — E1122 Type 2 diabetes mellitus with diabetic chronic kidney disease: Secondary | ICD-10-CM

## 2024-07-20 ENCOUNTER — Ambulatory Visit: Admitting: Internal Medicine

## 2024-07-20 ENCOUNTER — Ambulatory Visit: Payer: Self-pay | Admitting: Nurse Practitioner

## 2024-07-20 ENCOUNTER — Encounter: Payer: Self-pay | Admitting: Internal Medicine

## 2024-07-20 VITALS — BP 120/80 | HR 65 | Temp 98.3°F | Ht 67.0 in | Wt 209.8 lb

## 2024-07-20 DIAGNOSIS — E1122 Type 2 diabetes mellitus with diabetic chronic kidney disease: Secondary | ICD-10-CM

## 2024-07-20 DIAGNOSIS — N401 Enlarged prostate with lower urinary tract symptoms: Secondary | ICD-10-CM

## 2024-07-20 DIAGNOSIS — G8929 Other chronic pain: Secondary | ICD-10-CM

## 2024-07-20 DIAGNOSIS — E782 Mixed hyperlipidemia: Secondary | ICD-10-CM | POA: Diagnosis not present

## 2024-07-20 DIAGNOSIS — Z6832 Body mass index (BMI) 32.0-32.9, adult: Secondary | ICD-10-CM | POA: Diagnosis not present

## 2024-07-20 DIAGNOSIS — N1831 Chronic kidney disease, stage 3a: Secondary | ICD-10-CM

## 2024-07-20 DIAGNOSIS — R3911 Hesitancy of micturition: Secondary | ICD-10-CM | POA: Diagnosis not present

## 2024-07-20 DIAGNOSIS — E66811 Obesity, class 1: Secondary | ICD-10-CM

## 2024-07-20 DIAGNOSIS — E6609 Other obesity due to excess calories: Secondary | ICD-10-CM | POA: Diagnosis not present

## 2024-07-20 DIAGNOSIS — I129 Hypertensive chronic kidney disease with stage 1 through stage 4 chronic kidney disease, or unspecified chronic kidney disease: Secondary | ICD-10-CM

## 2024-07-20 DIAGNOSIS — M25511 Pain in right shoulder: Secondary | ICD-10-CM

## 2024-07-20 MED ORDER — TAMSULOSIN HCL 0.4 MG PO CAPS: 0.4000 mg | ORAL_CAPSULE | Freq: Every day | ORAL | 2 refills | Status: AC

## 2024-07-20 MED ORDER — ATORVASTATIN CALCIUM 80 MG PO TABS: 80.0000 mg | ORAL_TABLET | Freq: Every day | ORAL | 3 refills | Status: AC

## 2024-07-20 MED ORDER — METFORMIN HCL ER 500 MG PO TB24: 500.0000 mg | ORAL_TABLET | Freq: Two times a day (BID) | ORAL | 1 refills | Status: DC

## 2024-07-20 MED ORDER — AMLODIPINE BESYLATE 5 MG PO TABS: 5.0000 mg | ORAL_TABLET | Freq: Every day | ORAL | 2 refills | Status: AC

## 2024-07-20 NOTE — Patient Instructions (Signed)

## 2024-07-20 NOTE — Progress Notes (Unsigned)
 I,Victoria T Emmitt, CMA,acting as a neurosurgeon for Catheryn LOISE Slocumb, MD.,have documented all relevant documentation on the behalf of Catheryn LOISE Slocumb, MD,as directed by  Catheryn LOISE Slocumb, MD while in the presence of Catheryn LOISE Slocumb, MD.  Subjective:  Patient ID: Warren [name] , male    DOB: 1946-02-12 , 78 y.o.   MRN: [id number]  Chief Complaint  Patient presents with   Diabetes    Patient presents today for dm, bp & cholesterol follow up. He reports compliance with medications. Denies headache, chest pain & sob.   Hypertension   Hyperlipidemia    HPI Discussed the use of AI scribe software for clinical note transcription with the patient, who gave verbal consent to proceed.  History of Present Illness Warren [name] is a 78 year old male with diabetes and hypertension who presents for a routine check-up.  He manages his diabetes with atorvastatin  80 mg, Farxiga  10 mg, Januvia , and metformin . He receives Farxiga  and Januvia  through patient assistance and takes them regularly. He mentions needing refills for some medications.  He experiences shoulder pain. He denies having any recent fall.  He describes an inability to raise his arm fully and hears a popping sound when attempting to do so. He has been taking Aleve for the pain, which provides temporary relief.  He has been told to avoid NSAIDs like Aleve due to his impact on kidney function.  He reports urinary hesitancy and slow stream, for which he takes tamsulosin . He is uncertain about its effectiveness, noting variability in symptoms. He has a family history of prostate cancer.   Diabetes He presents for his follow-up diabetic visit. He has type 2 diabetes mellitus. His disease course has been improving. There are no hypoglycemic associated symptoms. Pertinent negatives for diabetes include no polydipsia, no polyphagia and no polyuria. There are no hypoglycemic complications. Diabetic complications include nephropathy. Risk factors for  coronary artery disease include hypertension, sedentary lifestyle, dyslipidemia, diabetes mellitus, male sex and obesity. Current diabetic treatment includes oral agent (dual therapy) and oral agent (monotherapy). He is following a generally healthy diet. When asked about meal planning, he reported none. He has not had a previous visit with a dietitian. He rarely participates in exercise. (He reports his blood sugars have been good, did not give me a number) An ACE inhibitor/angiotensin II receptor blocker is being taken. He does not see a podiatrist.Eye exam is not current.  Hypertension This is a chronic problem. The current episode started more than 1 year ago. The problem has been gradually improving since onset. The problem is controlled. Pertinent negatives include no anxiety. Risk factors for coronary artery disease include diabetes mellitus, male gender, obesity, dyslipidemia and sedentary lifestyle. Past treatments include calcium  channel blockers. The current treatment provides significant improvement. Compliance problems include exercise.  Hypertensive end-organ damage includes kidney disease. Identifiable causes of hypertension include chronic renal disease.     Past Medical History:  Diagnosis Date   AKI (acute kidney injury) 12/24/2021   Chronic kidney disease    stage 2   Class 1 obesity due to excess calories with body mass index (BMI) of 32.0 to 32.9 in adult 07/15/2023   Diabetes mellitus without complication (HCC)    Hypertension    Malaise and fatigue    RLL pneumonia 12/24/2021   Sepsis (HCC) 12/24/2021     Family History  Problem Relation Age of Onset   Hypertension Mother    Hypothyroidism Mother    Alzheimer's disease Father  Prostate cancer Father    Prostate cancer Brother      Current Outpatient Medications:    ACCU-CHEK GUIDE test strip, Use as instructed to check blood sugars 1 time per day, Disp: 50 strip, Rfl: 11   albuterol  (VENTOLIN  HFA) 108 (90 Base)  MCG/ACT inhaler, Inhale 2 puffs into the lungs every 6 (six) hours as needed for wheezing or shortness of breath., Disp: 8 g, Rfl: 2   dapagliflozin  propanediol (FARXIGA ) 10 MG TABS tablet, Take 1 tablet (10 mg total) by mouth daily., Disp: 90 tablet, Rfl: 3   JANUVIA  100 MG tablet, TAKE ONE TABLET (100mg  total) BY MOUTH EVERY DAY, Disp: 90 tablet, Rfl: 2   sildenafil  (REVATIO ) 20 MG tablet, TAKE ONE TABLET BY MOUTH EVERY DAY AS NEEDED. ** Max 3-5 tablets PER DAY, Disp: 60 tablet, Rfl: 1   amLODipine  (NORVASC ) 5 MG tablet, Take 1 tablet (5 mg total) by mouth daily., Disp: 90 tablet, Rfl: 2   atorvastatin  (LIPITOR ) 80 MG tablet, Take 1 tablet (80 mg total) by mouth daily., Disp: 90 tablet, Rfl: 3   metFORMIN  (GLUCOPHAGE ) 1000 MG tablet, Take 1 tablet (1,000 mg total) by mouth 2 (two) times daily with a meal., Disp: 30 tablet, Rfl: 1   metFORMIN  (GLUCOPHAGE -XR) 500 MG 24 hr tablet, Take 1 tablet (500 mg total) by mouth 2 (two) times daily with a meal., Disp: 180 tablet, Rfl: 1   tamsulosin  (FLOMAX ) 0.4 MG CAPS capsule, Take 1 capsule (0.4 mg total) by mouth daily., Disp: 90 capsule, Rfl: 2   No Known Allergies   Review of Systems  Constitutional: Negative.   Respiratory: Negative.    Cardiovascular: Negative.   Gastrointestinal: Negative.   Endocrine: Negative for polydipsia, polyphagia and polyuria.  Neurological: Negative.   Psychiatric/Behavioral: Negative.       Today's Vitals   07/20/24 1053  BP: 120/80  Pulse: 65  Temp: 98.3 F (36.8 C)  SpO2: 98%  Weight: 209 lb 12.8 oz (95.2 kg)  Height: 5' 7 (1.702 m)   Body mass index is 32.86 kg/m.  Wt Readings from Last 3 Encounters:  07/20/24 209 lb 12.8 oz (95.2 kg)  06/15/24 203 lb 12.8 oz (92.4 kg)  03/22/24 200 lb 9.6 oz (91 kg)    The ASCVD Risk score (Arnett DK, et al., 2019) failed to calculate for the following reasons:   Unable to determine if patient is Non-Hispanic African American  Objective:  Physical Exam Vitals  and nursing note reviewed.  Constitutional:      Appearance: Normal appearance.  HENT:     Head: Normocephalic and atraumatic.  Eyes:     Extraocular Movements: Extraocular movements intact.  Cardiovascular:     Rate and Rhythm: Normal rate and regular rhythm.     Heart sounds: Normal heart sounds.  Pulmonary:     Effort: Pulmonary effort is normal.     Breath sounds: Normal breath sounds.  Musculoskeletal:        General: Tenderness present.     Cervical back: Normal range of motion.     Comments: Decreased ROM right shoulder   Skin:    General: Skin is warm.  Neurological:     General: No focal deficit present.     Mental Status: He is alert.  Psychiatric:        Mood and Affect: Mood normal.         Assessment And Plan:   Assessment & Plan Type 2 diabetes mellitus with stage 3a chronic  kidney disease, without long-term current use of insulin  (HCC) Chronic.  Type 2 diabetes with stage 3 CKD. Blood pressure controlled. Renal impairment likely medication-related. Farxiga  used for renal and cardiac protection. - Continue Farxiga  for renal and cardiac protection. - Continue Januvia  and metformin  for glycemic control. - Avoid NSAIDs due to renal impairment. Hypertensive nephropathy Chronic, well controlled.  Amlodipine  not picked up since July, 90-day supply available. - Refilled amlodipine  prescription. Mixed hyperlipidemia Chronic, LDL goal is less than 70.  She will continue with atorvastatin  80mg  daily. Importance of medication compliance was stressed to the patient.  Chronic right shoulder pain Limited ROM and popping. Differential includes rotator cuff pathology and frozen shoulder. Discussed potential orthopedic evaluation and imaging cost concerns. - Referred to orthopedic specialist for evaluation and potential imaging. - Advised to discuss imaging costs with orthopedic office. Benign prostatic hyperplasia with urinary hesitancy BPH with urinary hesitancy and slow  stream. Tamsulosin  provides partial relief. Family history of prostate cancer. Discussed potential urology referral if PSA elevated. - Checked PSA level. - Consider urology referral if PSA elevated. Class 1 obesity due to excess calories with serious comorbidity and body mass index (BMI) of 32.0 to 32.9 in adult He is encouraged to strive for BMI less than 30 to decrease cardiac risk. Advised to aim for at least 150 minutes of exercise per week.     Orders Placed This Encounter  Procedures   CMP14+EGFR   Hemoglobin A1c   CBC   TSH   PSA Total (Reflex To Free)   Ambulatory referral to Orthopedic Surgery     Return for 4 month dm f/u w JM.  Patient was given opportunity to ask questions. Patient verbalized understanding of the plan and was able to repeat key elements of the plan. All questions were answered to their satisfaction.    I, Catheryn LOISE Slocumb, MD, have reviewed all documentation for this visit. The documentation on 07/30/2024 for the exam, diagnosis, procedures, and orders are all accurate and complete.   IF YOU HAVE BEEN REFERRED TO A SPECIALIST, IT MAY TAKE 1-2 WEEKS TO SCHEDULE/PROCESS THE REFERRAL. IF YOU HAVE NOT HEARD FROM US /SPECIALIST IN TWO WEEKS, PLEASE GIVE US  A CALL AT [phone].

## 2024-07-21 LAB — CMP14+EGFR
ALT: 16 IU/L (ref 0–44)
AST: 19 IU/L (ref 0–40)
Albumin: 4.3 g/dL (ref 3.8–4.8)
Alkaline Phosphatase: 62 IU/L (ref 47–123)
BUN/Creatinine Ratio: 10 (ref 10–24)
BUN: 13 mg/dL (ref 8–27)
Bilirubin Total: 0.8 mg/dL (ref 0.0–1.2)
CO2: 20 mmol/L (ref 20–29)
Calcium: 10.4 mg/dL — ABNORMAL HIGH (ref 8.6–10.2)
Chloride: 103 mmol/L (ref 96–106)
Creatinine, Ser: 1.34 mg/dL — ABNORMAL HIGH (ref 0.76–1.27)
Globulin, Total: 2.6 g/dL (ref 1.5–4.5)
Glucose: 200 mg/dL — ABNORMAL HIGH (ref 70–99)
Potassium: 4.5 mmol/L (ref 3.5–5.2)
Sodium: 139 mmol/L (ref 134–144)
Total Protein: 6.9 g/dL (ref 6.0–8.5)
eGFR: 55 mL/min/1.73 — ABNORMAL LOW (ref >59)

## 2024-07-21 LAB — HEMOGLOBIN A1C
Est. average glucose Bld gHb Est-mCnc: 246 mg/dL
Hgb A1c MFr Bld: 10.2 % — ABNORMAL HIGH (ref 4.8–5.6)

## 2024-07-21 LAB — CBC
Hematocrit: 45.2 % (ref 37.5–51.0)
Hemoglobin: 15.5 g/dL (ref 13.0–17.7)
MCH: 30.9 pg (ref 26.6–33.0)
MCHC: 34.3 g/dL (ref 31.5–35.7)
MCV: 90 fL (ref 79–97)
Platelets: 161 x10E3/uL (ref 150–450)
RBC: 5.01 x10E6/uL (ref 4.14–5.80)
RDW: 13.4 % (ref 11.6–15.4)
WBC: 7.7 x10E3/uL (ref 3.4–10.8)

## 2024-07-21 LAB — TSH: TSH: 1.76 u[IU]/mL (ref 0.450–4.500)

## 2024-07-21 LAB — PSA TOTAL (REFLEX TO FREE): Prostate Specific Ag, Serum: 1.7 ng/mL (ref 0.0–4.0)

## 2024-07-25 ENCOUNTER — Ambulatory Visit: Payer: Self-pay | Admitting: Internal Medicine

## 2024-07-26 ENCOUNTER — Other Ambulatory Visit: Payer: Self-pay

## 2024-07-26 MED ORDER — METFORMIN HCL 1000 MG PO TABS: 1000.0000 mg | ORAL_TABLET | Freq: Two times a day (BID) | ORAL | 1 refills | Status: DC

## 2024-07-27 NOTE — Progress Notes (Signed)
 Pharmacy Quality Measure Review  This patient is appearing on a report for being at risk of failing the adherence measure for diabetes medications this calendar year.   Medication: metformin  500 mg Last fill date: 07/21/24 for 90 day supply  Insurance report was not up to date. No action needed at this time.   Jenkins Graces, PharmD PGY1 Pharmacy Resident

## 2024-07-30 NOTE — Assessment & Plan Note (Signed)
 Limited ROM and popping. Differential includes rotator cuff pathology and frozen shoulder. Discussed potential orthopedic evaluation and imaging cost concerns. - Referred to orthopedic specialist for evaluation and potential imaging. - Advised to discuss imaging costs with orthopedic office.

## 2024-07-30 NOTE — Assessment & Plan Note (Signed)
 BPH with urinary hesitancy and slow stream. Tamsulosin  provides partial relief. Family history of prostate cancer. Discussed potential urology referral if PSA elevated. - Checked PSA level. - Consider urology referral if PSA elevated.

## 2024-07-30 NOTE — Assessment & Plan Note (Signed)
 Chronic, LDL goal is less than 70.  She will continue with atorvastatin  80mg  daily. Importance of medication compliance was stressed to the patient.

## 2024-07-30 NOTE — Assessment & Plan Note (Signed)
 Chronic, well controlled.  Amlodipine  not picked up since July, 90-day supply available. - Refilled amlodipine  prescription.

## 2024-08-15 ENCOUNTER — Ambulatory Visit: Admitting: Physician Assistant

## 2024-08-15 ENCOUNTER — Telehealth: Payer: Self-pay | Admitting: Pharmacist

## 2024-08-15 ENCOUNTER — Other Ambulatory Visit (INDEPENDENT_AMBULATORY_CARE_PROVIDER_SITE_OTHER)

## 2024-08-15 DIAGNOSIS — G8929 Other chronic pain: Secondary | ICD-10-CM

## 2024-08-15 DIAGNOSIS — M25511 Pain in right shoulder: Secondary | ICD-10-CM

## 2024-08-15 DIAGNOSIS — E1165 Type 2 diabetes mellitus with hyperglycemia: Secondary | ICD-10-CM

## 2024-08-15 NOTE — Progress Notes (Signed)
 "  Office Visit Note   Patient: Warren [name]           Date of Birth: 03-11-1946           MRN: [id number] Visit Date: 08/15/2024              Requested by: Jarold Medici, MD [address] [place],  [place] [address] PCP: Georgina Speaks, [name]   Assessment & Plan: Visit Diagnoses:  1. Chronic right shoulder pain     Plan: Impression is chronic right shoulder pain likely from underlying subacromial bursitis/rotator cuff tendinitis.  I do not feel he has a rotator cuff tear based on his clinical exam today.  I have offered him a subacromial cortisone injection for which he is politely declined.  He does currently get relief with occasional Aleve.  He will follow-up if he changes his mind or his symptoms worsen.  Call with concerns or questions.  Follow-Up Instructions: Return if symptoms worsen or fail to improve.   Orders:  Orders Placed This Encounter  Procedures   XR Shoulder Right   No orders of the defined types were placed in this encounter.     Procedures: No procedures performed   Clinical Data: No additional findings.   Subjective: Chief Complaint  Patient presents with   Right Shoulder - Pain    HPI patient is a pleasant 79 year old gentleman who comes in today with right shoulder pain for the past year.  He denies any specific injury but notes he was trying to crank his lawnmower for approximately an hour and noticed soon after anterior shoulder pain.  This persisted over the past year.  He denies any improvement but no worsening symptoms either.  His symptoms are intermittent.  Pain appears to be worse with certain movements of the shoulder such as the extremes and but internal rotation forward flexion.  He does take occasional Aleve which alleviates his symptoms.  No previous cortisone injections right shoulder.  Review of Systems as detailed in HPI.  All others reviewed and are negative.   Objective: Vital Signs: There were no vitals taken  for this visit.  Physical Exam well-developed well-nourished gentleman in no acute distress.  Alert and oriented x 3.  Ortho Exam right shoulder exam: Forward flexion to 175 degrees.  Abduction to 130 degrees.  Internal rotation to T12.  External rotation to 45 degrees.  He does have pain with empty can testing.  Negative bearhug, speeds, O'Brien's testing.  Full strength throughout.  He is neurovascularly intact distally.  Specialty Comments:  No specialty comments available.  Imaging: XR Shoulder Right Result Date: 08/15/2024 X-rays demonstrate moderate degenerative changes to the [facility] joint.  Mild degenerative changes glenohumeral joint.  No spear migration humeral head.    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   Past Medical History:  Diagnosis Date   AKI (acute kidney injury) 12/24/2021   Chronic kidney disease    stage 2   Class 1 obesity due to excess calories with body mass index (BMI) of 32.0 to 32.9 in adult 07/15/2023  Diabetes mellitus without complication (HCC)    Hypertension    Malaise and fatigue    RLL pneumonia 12/24/2021   Sepsis (HCC) 12/24/2021    Family History  Problem Relation Age of Onset   Hypertension Mother    Hypothyroidism Mother    Alzheimer's disease Father    Prostate cancer Father    Prostate cancer Brother     Past Surgical History:  Procedure Laterality Date   surgical repair     metatarsal repair.    Social History   Occupational History   Occupation: retired  Tobacco Use   Smoking status:  Never   Smokeless tobacco: Never  Vaping Use   Vaping status: Never Used  Substance and Sexual Activity   Alcohol use: Yes    Alcohol/week: 7.0 standard drinks of alcohol    Types: 7 Cans of beer per week    Comment: daily   Drug use: Yes    Types: Marijuana   Sexual activity: Yes        "

## 2024-08-15 NOTE — Telephone Encounter (Signed)
 PAP: Patient assistance application for Farxiga  has been approved by PAP Companies: [employer] from 08/11/2024 to 08/10/2025. Medication should be delivered to PAP Delivery: Home. For further shipping updates, please contact [employer] ([employer]) at [phone]. Patient ID is: provided in letter.

## 2024-08-15 NOTE — Progress Notes (Signed)
" ° °  08/15/2024 Name: JOSE CORVIN MRN: [id number] DOB: May 08, 1946  Chief Complaint  Patient presents with   Medication Management    Diabetes    Patient was called regarding diabetes management. Unfortunately, he did not answer the phone. HIPAA compliant message was left on his voicemail.  Lab Results  Component Value Date   HGBA1C 10.2 (H) 07/20/2024   HGBA1C 10.3 (H) 03/22/2024   HGBA1C 9.7 (H) 11/17/2023   Currently on: Farxiga  10 mg daily, Januvia  100 mg daily, Metformin  500 mg 1 tablet twice daily,   Patient's HgA1c has remained elevated.  Basal insulin  or therapy with Ozempic or Moujaro has been suggest without success in the past.  Lipid Panel     Component Value Date/Time   CHOL 165 03/22/2024 1053   TRIG 112 03/22/2024 1053   HDL 69 03/22/2024 1053   CHOLHDL 2.4 03/22/2024 1053   LDLCALC 76 03/22/2024 1053   LABVLDL 20 03/22/2024 1053   LDL at goal.  On Atorvastatin  80 mg last filled 07/21/24  Not on ARB or ACE  Amlodipine  not filled since July.  Plan: Follow up with the Patient in 2 weeks.   [name], PharmD, BCACP Clinical Pharmacist [phone]   "

## 2024-08-16 ENCOUNTER — Other Ambulatory Visit: Payer: Self-pay | Admitting: Internal Medicine

## 2024-08-19 ENCOUNTER — Other Ambulatory Visit: Payer: Self-pay | Admitting: Nurse Practitioner

## 2024-08-29 NOTE — Progress Notes (Signed)
 I,Victoria T Emmitt, CMA,acting as a neurosurgeon for Warren LOISE Slocumb, MD.,have documented all relevant documentation on the behalf of Warren LOISE Slocumb, MD,as directed by  Warren LOISE Slocumb, MD while in the presence of Warren LOISE Slocumb, MD.  Subjective:  Patient ID: Warren [name] , male    DOB: 05-30-1946 , 79 y.o.   MRN: [id number]  Chief Complaint  Patient presents with   Diabetes    Patient presents today for a diabetes check. Patient reports compliance with his meds.    Hypertension    HPI Discussed the use of AI scribe software for clinical note transcription with the patient, who gave verbal consent to proceed.  History of Present Illness Warren [name] is a 79 year old male with type 2 diabetes mellitus who presents for medication management and follow-up.  He takes metformin  1000 mg twice daily, Farxiga , and Januvia  for diabetes management. He recently received a new supply of metformin  sent to [facility], which he found unusual as he does not have his address listed there. He describes himself as 'homeless' and stays at different places, including [facility]. His blood sugar levels have not been optimal, and his A1c has been elevated since 2022. He attributes some dietary indiscretions to the holiday period but feels better with the current metformin  dosage. He consumes oatmeal every morning and takes his medications as prescribed.  He has been told he has chronic kidney disease and is currently taking Farxiga . He had been taking NSAIDs like ibuprofen for shoulder pain diagnosed as arthritis. He refused a cortisone injection for his shoulder.  He has a history of cataracts and reports difficulty driving at night due to vision issues.  He walks regularly for exercise, although he has reduced activity due to cold weather. He walks to a nearby store from [facility] when possible.   Diabetes He presents for his follow-up diabetic visit. He has type 2 diabetes mellitus. His disease  course has been improving. There are no hypoglycemic associated symptoms. Pertinent negatives for diabetes include no polydipsia, no polyphagia and no polyuria. There are no hypoglycemic complications. Diabetic complications include nephropathy. Risk factors for coronary artery disease include hypertension, sedentary lifestyle, dyslipidemia, diabetes mellitus, male sex and obesity. Current diabetic treatment includes oral agent (dual therapy) and oral agent (monotherapy). He is following a generally healthy diet. When asked about meal planning, he reported none. He has not had a previous visit with a dietitian. He rarely participates in exercise. (He reports his blood sugars have been good, did not give me a number) An ACE inhibitor/angiotensin II receptor blocker is being taken. He does not see a podiatrist.Eye exam is not current.  Hypertension This is a chronic problem. The current episode started more than 1 year ago. The problem has been gradually improving since onset. The problem is controlled. Pertinent negatives include no anxiety. Risk factors for coronary artery disease include diabetes mellitus, male gender, obesity, dyslipidemia and sedentary lifestyle. Past treatments include calcium  channel blockers. The current treatment provides significant improvement. Compliance problems include exercise.  Hypertensive end-organ damage includes kidney disease. Identifiable causes of hypertension include chronic renal disease.     Past Medical History:  Diagnosis Date   AKI (acute kidney injury) 12/24/2021   Chronic kidney disease    stage 2   Class 1 obesity due to excess calories with body mass index (BMI) of 32.0 to 32.9 in adult 07/15/2023   Diabetes mellitus without complication (HCC)    Hypertension  Malaise and fatigue    RLL pneumonia 12/24/2021   Sepsis (HCC) 12/24/2021     Family History  Problem Relation Age of Onset   Hypertension Mother    Hypothyroidism Mother    Alzheimer's  disease Father    Prostate cancer Father    Prostate cancer Brother      Current Outpatient Medications:    ACCU-CHEK GUIDE test strip, Use as instructed to check blood sugars 1 time per day, Disp: 50 strip, Rfl: 11   albuterol  (VENTOLIN  HFA) 108 (90 Base) MCG/ACT inhaler, Inhale 2 puffs into the lungs every 6 (six) hours as needed for wheezing or shortness of breath., Disp: 8 g, Rfl: 2   amLODipine  (NORVASC ) 5 MG tablet, Take 1 tablet (5 mg total) by mouth daily., Disp: 90 tablet, Rfl: 2   atorvastatin  (LIPITOR ) 80 MG tablet, Take 1 tablet (80 mg total) by mouth daily., Disp: 90 tablet, Rfl: 3   dapagliflozin  propanediol (FARXIGA ) 10 MG TABS tablet, Take 1 tablet (10 mg total) by mouth daily., Disp: 90 tablet, Rfl: 3   sildenafil  (REVATIO ) 20 MG tablet, TAKE ONE TABLET BY MOUTH EVERY DAY AS NEEDED. ** Max 3-5 tablets PER DAY, Disp: 60 tablet, Rfl: 1   tamsulosin  (FLOMAX ) 0.4 MG CAPS capsule, Take 1 capsule (0.4 mg total) by mouth daily., Disp: 90 capsule, Rfl: 2   metFORMIN  (GLUCOPHAGE ) 1000 MG tablet, Take 1 tablet (1,000 mg total) by mouth 2 (two) times daily with a meal., Disp: 180 tablet, Rfl: 1   Semaglutide  (RYBELSUS ) 3 MG TABS, Take 1 tablet (3 mg total) by mouth daily., Disp: , Rfl:    No Known Allergies   Review of Systems  Constitutional: Negative.   Respiratory: Negative.    Cardiovascular: Negative.   Gastrointestinal: Negative.   Endocrine: Negative for polydipsia, polyphagia and polyuria.  Musculoskeletal: Negative.   Skin: Negative.   Allergic/Immunologic: Negative.   Hematological: Negative.   Psychiatric/Behavioral: Negative.       Today's Vitals   08/30/24 0841  BP: 120/82  Pulse: 63  Temp: 97.6 F (36.4 C)  TempSrc: Oral  Weight: 203 lb (92.1 kg)  Height: 5' 7 (1.702 m)  PainSc: 0-No pain   Body mass index is 31.79 kg/m.  Wt Readings from Last 3 Encounters:  08/30/24 203 lb (92.1 kg)  07/20/24 209 lb 12.8 oz (95.2 kg)  06/15/24 203 lb 12.8 oz (92.4  kg)    The ASCVD Risk score (Arnett DK, et al., 2019) failed to calculate for the following reasons:   Unable to determine if patient is Non-Hispanic African American  Objective:  Physical Exam Vitals and nursing note reviewed.  Constitutional:      Appearance: Normal appearance. He is obese.  HENT:     Head: Normocephalic and atraumatic.  Eyes:     Extraocular Movements: Extraocular movements intact.  Cardiovascular:     Rate and Rhythm: Normal rate and regular rhythm.     Heart sounds: Normal heart sounds.  Pulmonary:     Effort: Pulmonary effort is normal.     Breath sounds: Normal breath sounds.  Musculoskeletal:     Cervical back: Normal range of motion.  Skin:    General: Skin is warm.  Neurological:     General: No focal deficit present.     Mental Status: He is alert.  Psychiatric:        Mood and Affect: Mood normal.         Assessment And Plan:   Assessment &  Plan Type 2 diabetes mellitus with stage 3a chronic kidney disease, without long-term current use of insulin  (HCC) Suboptimal glycemic control with A1c above target. Farxiga  beneficial for renal and cardiac protection. Rybelsus  considered as alternative to Januvia . Prefers oral medication. - Continue metformin  1000 mg twice daily. - Continue Farxiga . - Consider Rybelsus  if glycemic control does not improve, he does not have family history of thyroid  cancer.  - Ordered kidney function tests. - Scheduled ultrasound of kidneys. - Advised dietary changes, including reducing sugary drinks. - Encouraged walking for physical activity. - Referred to ophthalmologist for diabetic cataract evaluation. - Regarding CKD, he was reminded to stop using NSAIDs. He admits he had been taking these for pain.  Hypertensive nephropathy Chronic, well controlled.  Amlodipine  not picked up since July, 90-day supply available. - Refilled amlodipine  prescription. - Consider low dose ARB therapy for renal protection.  Diabetic  cataract (HCC) Present with night vision difficulty. Previous evaluation by Dr. Eyvonne indicated no immediate intervention. - Referred to ophthalmologist Dr. Eyvonne for cataract evaluation. Class 1 obesity due to excess calories with serious comorbidity and body mass index (BMI) of 31.0 to 31.9 in adult He is encouraged to strive for BMI less than 30 to decrease cardiac risk. Advised to aim for at least 150 minutes of exercise per week.  Chronic right shoulder pain Shoulder osteoarthritis Declined cortisone injection. Advised to avoid NSAIDs due to kidney disease. - Advised use of Tylenol  for pain management instead of NSAIDs.    Orders Placed This Encounter  Procedures   US  Renal   BMP8+EGFR   PTH, intact and calcium    Phosphorus   Protein electrophoresis, serum   Ambulatory referral to Ophthalmology     Return in 6 weeks (on 10/11/2024), or dm check with RS.  Patient was given opportunity to ask questions. Patient verbalized understanding of the plan and was able to repeat key elements of the plan. All questions were answered to their satisfaction.    I, Warren LOISE Slocumb, MD, have reviewed all documentation for this visit. The documentation on 08/30/24 for the exam, diagnosis, procedures, and orders are all accurate and complete.   IF YOU HAVE BEEN REFERRED TO A SPECIALIST, IT MAY TAKE 1-2 WEEKS TO SCHEDULE/PROCESS THE REFERRAL. IF YOU HAVE NOT HEARD FROM US /SPECIALIST IN TWO WEEKS, PLEASE GIVE US  A CALL AT [phone].

## 2024-08-29 NOTE — Patient Instructions (Signed)

## 2024-08-30 ENCOUNTER — Encounter: Payer: Self-pay | Admitting: Internal Medicine

## 2024-08-30 ENCOUNTER — Telehealth: Payer: Self-pay | Admitting: Pharmacist

## 2024-08-30 ENCOUNTER — Ambulatory Visit (INDEPENDENT_AMBULATORY_CARE_PROVIDER_SITE_OTHER): Payer: Self-pay | Admitting: Internal Medicine

## 2024-08-30 VITALS — BP 120/82 | HR 63 | Temp 97.6°F | Ht 67.0 in | Wt 203.0 lb

## 2024-08-30 DIAGNOSIS — E1122 Type 2 diabetes mellitus with diabetic chronic kidney disease: Secondary | ICD-10-CM

## 2024-08-30 DIAGNOSIS — N1831 Chronic kidney disease, stage 3a: Secondary | ICD-10-CM

## 2024-08-30 DIAGNOSIS — I129 Hypertensive chronic kidney disease with stage 1 through stage 4 chronic kidney disease, or unspecified chronic kidney disease: Secondary | ICD-10-CM | POA: Diagnosis not present

## 2024-08-30 DIAGNOSIS — Z6831 Body mass index (BMI) 31.0-31.9, adult: Secondary | ICD-10-CM | POA: Diagnosis not present

## 2024-08-30 DIAGNOSIS — E6609 Other obesity due to excess calories: Secondary | ICD-10-CM

## 2024-08-30 DIAGNOSIS — E1136 Type 2 diabetes mellitus with diabetic cataract: Secondary | ICD-10-CM | POA: Diagnosis not present

## 2024-08-30 DIAGNOSIS — M25511 Pain in right shoulder: Secondary | ICD-10-CM

## 2024-08-30 DIAGNOSIS — E1165 Type 2 diabetes mellitus with hyperglycemia: Secondary | ICD-10-CM

## 2024-08-30 DIAGNOSIS — E66811 Obesity, class 1: Secondary | ICD-10-CM | POA: Diagnosis not present

## 2024-08-30 DIAGNOSIS — G8929 Other chronic pain: Secondary | ICD-10-CM

## 2024-08-30 MED ORDER — METFORMIN HCL 1000 MG PO TABS: 1000.0000 mg | ORAL_TABLET | Freq: Two times a day (BID) | ORAL | 1 refills | Status: AC

## 2024-08-30 NOTE — Assessment & Plan Note (Addendum)
 Chronic, well controlled.  Amlodipine  not picked up since July, 90-day supply available. - Refilled amlodipine  prescription. - Consider low dose ARB therapy for renal protection.

## 2024-08-30 NOTE — Progress Notes (Signed)
" ° °  08/30/2024 Name: Warren [name] MRN: [id number] DOB: [birth year]  Chief Complaint  Patient presents with   Medication Management    Diabetes    Patient was called regarding medication management of diabetes. Unfortunately, he did not answer his phone. HIPAA compliant message was left on his voicemail.  Diabetes:  Current medications:   Januvia  100 mg 1 tablet daily Farxiga  10 mg 1 tablet daily-gets through Patient Assistance Metformin  1000 mg 1 tablet twice daily  ( not filled as prescribed--last filled 12/25 #30 but prior to that not since July (90 day supply) )   Macrovascular and Microvascular Risk Reduction:  Statin? yes (Atorvastatin  80 mg last filled 07/21/24 90 day supply); ACEi/ARB? no Last urinary albumin/creatinine ratio:  Lab Results  Component Value Date   MICRALBCREAT 14 11/17/2023   MICRALBCREAT 36 (H) 11/26/2022   MICRALBCREAT 30 07/10/2021   MICRALBCREAT <30 03/26/2020   MICRALBCREAT 30 02/08/2019   Last eye exam:  Lab Results  Component Value Date   HMDIABEYEEXA  04/08/2024     Comment:     unreadable - left, no image - right abst by him   Last foot exam: 03/22/2024 Tobacco Use:  Tobacco Use: Low Risk (08/30/2024)   Patient History    Smoking Tobacco Use: Never    Smokeless Tobacco Use: Never    Passive Exposure: Not on file     Objective:  Lab Results  Component Value Date   HGBA1C 10.2 (H) 07/20/2024    Lab Results  Component Value Date   CREATININE 1.34 (H) 07/20/2024   BUN 13 07/20/2024   NA 139 07/20/2024   K 4.5 07/20/2024   CL 103 07/20/2024   CO2 20 07/20/2024    Lab Results  Component Value Date   CHOL 165 03/22/2024   HDL 69 03/22/2024   LDLCALC 76 03/22/2024   TRIG 112 03/22/2024   CHOLHDL 2.4 03/22/2024       Follow Up Plan:    Call Patient back in 1 month.   [name], PharmD, BCACP Clinical Pharmacist [phone] "

## 2024-08-31 ENCOUNTER — Telehealth: Payer: Self-pay | Admitting: Pharmacist

## 2024-08-31 DIAGNOSIS — E1165 Type 2 diabetes mellitus with hyperglycemia: Secondary | ICD-10-CM

## 2024-08-31 MED ORDER — RYBELSUS 3 MG PO TABS: 1.0000 | ORAL_TABLET | Freq: Every day | ORAL | Status: AC

## 2024-08-31 NOTE — Progress Notes (Signed)
 "  08/31/2024 Name: Warren [name] MRN: [id number] DOB: June 08, 1946  Chief Complaint  Patient presents with   Medication Management    Rybelsus     [name] is a 79 y.o. year old male who presented for a telephone visit.   They were referred to the pharmacist by their PCP for assistance in managing diabetes.    Subjective: Warren [name] is a 79 year old male with multiple medical conditions including but not limited to:  hypertension, hyperlipidemia, insomnia, type 2 diabetes, and diabetic cataract.  He called then later presented to clinic in reference to Rybelsus .   Care Team: Primary Care Provider: Georgina Speaks, [name] ; Next Scheduled Visit: 10/13/2024   Medication Access/Adherence  Current Pharmacy:  [facility] - [place], [place] - [address] [place] [address] Phone: [phone] Fax: [phone]  [id number] - [place], [place] - [address] AT [facility] [address] [facility] [address] [place] [address] Phone: [phone] Fax: [phone]   Patient reports affordability concerns with their medications: Yes  Patient reports access/transportation concerns to their pharmacy: No  Patient reports adherence concerns with their medications:  No      Diabetes:  Current medications:   Januvia  100 mg daily Farxiga  10 mg 1 tablet daily  Macrovascular and Microvascular Risk Reduction:  Statin? yes (Atorvastatin ); ACEi/ARB? No--therapy could be considered Last urinary albumin/creatinine ratio:  Lab Results  Component Value Date   MICRALBCREAT 14 11/17/2023   MICRALBCREAT 36 (H) 11/26/2022   MICRALBCREAT 30 07/10/2021   MICRALBCREAT <30 03/26/2020   MICRALBCREAT 30 02/08/2019   Last eye exam:  Lab Results  Component Value Date   HMDIABEYEEXA  04/08/2024     Comment:     unreadable - left, no image - right abst by him   Last foot exam: 03/22/2024 Tobacco Use:  Tobacco Use: Low Risk  (08/30/2024)   Patient History    Smoking Tobacco Use: Never    Smokeless Tobacco Use: Never    Passive Exposure: Not on file     Objective:  Lab Results  Component Value Date   HGBA1C 10.2 (H) 07/20/2024    Lab Results  Component Value Date   CREATININE 1.34 (H) 07/20/2024   BUN 13 07/20/2024   NA 139 07/20/2024   K 4.5 07/20/2024   CL 103 07/20/2024   CO2 20 07/20/2024    Lab Results  Component Value Date   CHOL 165 03/22/2024   HDL 69 03/22/2024   LDLCALC 76 03/22/2024   TRIG 112 03/22/2024   CHOLHDL 2.4 03/22/2024    Medications Reviewed Today     Reviewed by Jolee Warren PARAS, RPH (Pharmacist) on 08/31/24 at 1553  Med List Status: <None>   Medication Order Taking? Sig Documenting Provider Last Dose Status Informant  ACCU-CHEK GUIDE test strip [id number] Yes Use as instructed to check blood sugars 1 time per day Georgina Speaks, [name]  Active   albuterol  (VENTOLIN  HFA) 108 (90 Base) MCG/ACT inhaler [id number] Yes Inhale 2 puffs into the lungs every 6 (six) hours as needed for wheezing or shortness of breath. Jarold Medici, MD  Active   amLODipine  (NORVASC ) 5 MG tablet [id number] Yes Take 1 tablet (5 mg total) by mouth daily. Jarold Medici, MD  Active   atorvastatin  (LIPITOR ) 80 MG tablet [id number] Yes Take 1 tablet (80 mg total) by mouth daily. Jarold Medici, MD  Active   dapagliflozin  propanediol (FARXIGA )  10 MG TABS tablet [id number] Yes Take 1 tablet (10 mg total) by mouth daily. Jarold Medici, MD  Active            Med Note MACK, Warren JINNY Warren [name] Aug 31, 2024  1:35 PM) GETS THROUGH PATIENT ASSISTANCE  JANUVIA  100 MG tablet [id number] Yes TAKE ONE TABLET (100mg  total) BY MOUTH EVERY DAY Georgina Speaks, [name]  Active            Med Note MACK Warren JINNY Warren [name] Aug 31, 2024  1:35 PM) GETS THROUGH PATIENT ASSISTANCE  metFORMIN  (GLUCOPHAGE ) 1000 MG tablet [id number] Yes Take 1 tablet (1,000 mg total) by mouth 2 (two) times daily with a meal. Jarold Medici, MD  Active    sildenafil  (REVATIO ) 20 MG tablet [id number] Yes TAKE ONE TABLET BY MOUTH EVERY DAY AS NEEDED. ** Max 3-5 tablets PER DAY Georgina Speaks, [name]  Active   tamsulosin  (FLOMAX ) 0.4 MG CAPS capsule [id number] Yes Take 1 capsule (0.4 mg total) by mouth daily. Jarold Medici, MD  Active               Assessment/Plan:   Per appointment with Dr. Jarold, Januvia  will be discontinued and the Patient will be started on Rybelsus  3 mg daily   Take once every morning when you wake up  Take on an empty stomach  Swallow with  cup or less of plain water  Do not take with food, coffee, or juice  Wait 30 minutes before eating, drinking, or taking other medicines  Swallow tablet whole (do not crush or chew)  Patient was given a 30 day sample of Rybelsus   LOT [id number] exp 12/08/2024  Follow Up Plan:   Patient will come into the clinic on 09/14/24 at 10 am for an in person visit to review his blood sugars in his blood glucometer.  Warren [name], PharmD, BCACP Clinical Pharmacist [phone]    "

## 2024-09-01 LAB — PROTEIN ELECTROPHORESIS, SERUM
A/G Ratio: 1.3 (ref 0.7–1.7)
Albumin ELP: 3.8 g/dL (ref 2.9–4.4)
Alpha 1: 0.2 g/dL (ref 0.0–0.4)
Alpha 2: 0.6 g/dL (ref 0.4–1.0)
Beta: 1.2 g/dL (ref 0.7–1.3)
Gamma Globulin: 1 g/dL (ref 0.4–1.8)
Globulin, Total: 2.9 g/dL (ref 2.2–3.9)
Total Protein: 6.7 g/dL (ref 6.0–8.5)

## 2024-09-01 LAB — PTH, INTACT AND CALCIUM: PTH: 47 pg/mL (ref 15–65)

## 2024-09-01 LAB — BMP8+EGFR
BUN/Creatinine Ratio: 11 (ref 10–24)
BUN: 15 mg/dL (ref 8–27)
CO2: 22 mmol/L (ref 20–29)
Calcium: 11.3 mg/dL — ABNORMAL HIGH (ref 8.6–10.2)
Chloride: 103 mmol/L (ref 96–106)
Creatinine, Ser: 1.35 mg/dL — ABNORMAL HIGH (ref 0.76–1.27)
Glucose: 183 mg/dL — ABNORMAL HIGH (ref 70–99)
Potassium: 5.4 mmol/L — ABNORMAL HIGH (ref 3.5–5.2)
Sodium: 141 mmol/L (ref 134–144)
eGFR: 54 mL/min/1.73 — ABNORMAL LOW

## 2024-09-01 LAB — PHOSPHORUS: Phosphorus: 3.3 mg/dL (ref 2.8–4.1)

## 2024-09-04 NOTE — Assessment & Plan Note (Signed)
 Present with night vision difficulty. Previous evaluation by Dr. Eyvonne indicated no immediate intervention. - Referred to ophthalmologist Dr. Eyvonne for cataract evaluation.

## 2024-09-04 NOTE — Assessment & Plan Note (Signed)
 Shoulder osteoarthritis Declined cortisone injection. Advised to avoid NSAIDs due to kidney disease. - Advised use of Tylenol  for pain management instead of NSAIDs.

## 2024-09-05 ENCOUNTER — Ambulatory Visit: Payer: Self-pay | Admitting: Internal Medicine

## 2024-09-06 NOTE — Telephone Encounter (Signed)
 I am forwarding to you, was not sure if you could see it

## 2024-09-12 ENCOUNTER — Other Ambulatory Visit

## 2024-09-14 ENCOUNTER — Ambulatory Visit: Payer: Self-pay

## 2024-10-13 ENCOUNTER — Ambulatory Visit: Payer: Self-pay | Admitting: Internal Medicine

## 2024-11-22 ENCOUNTER — Encounter: Payer: Self-pay | Admitting: Nurse Practitioner

## 2025-07-12 ENCOUNTER — Ambulatory Visit
